# Patient Record
Sex: Female | Born: 1965 | Race: Black or African American | Hispanic: No | Marital: Married | Smoking: Current every day smoker
Health system: Southern US, Community
[De-identification: ages and names within clinical notes are randomized; demographics above are authoritative.]

## PROBLEM LIST (undated history)

## (undated) DIAGNOSIS — N200 Calculus of kidney: Secondary | ICD-10-CM

## (undated) DIAGNOSIS — I10 Essential (primary) hypertension: Secondary | ICD-10-CM

## (undated) DIAGNOSIS — I739 Peripheral vascular disease, unspecified: Secondary | ICD-10-CM

## (undated) DIAGNOSIS — M069 Rheumatoid arthritis, unspecified: Secondary | ICD-10-CM

## (undated) DIAGNOSIS — I119 Hypertensive heart disease without heart failure: Secondary | ICD-10-CM

## (undated) DIAGNOSIS — I2510 Atherosclerotic heart disease of native coronary artery without angina pectoris: Secondary | ICD-10-CM

## (undated) HISTORY — DX: Rheumatoid arthritis, unspecified: M06.9

## (undated) HISTORY — DX: Calculus of kidney: N20.0

---

## 2003-01-02 ENCOUNTER — Emergency Department (HOSPITAL_COMMUNITY): Admission: EM | Admit: 2003-01-02 | Discharge: 2003-01-02 | Payer: Self-pay | Admitting: Emergency Medicine

## 2003-03-08 ENCOUNTER — Ambulatory Visit (HOSPITAL_COMMUNITY): Admission: RE | Admit: 2003-03-08 | Discharge: 2003-03-08 | Payer: Self-pay | Admitting: Orthopaedic Surgery

## 2003-03-08 ENCOUNTER — Encounter: Payer: Self-pay | Admitting: Orthopaedic Surgery

## 2012-02-01 ENCOUNTER — Encounter (HOSPITAL_COMMUNITY): Payer: Self-pay | Admitting: Emergency Medicine

## 2012-02-01 ENCOUNTER — Emergency Department (HOSPITAL_COMMUNITY): Payer: Self-pay

## 2012-02-01 ENCOUNTER — Emergency Department (HOSPITAL_COMMUNITY)
Admission: EM | Admit: 2012-02-01 | Discharge: 2012-02-01 | Disposition: A | Discharge status: Home / Self Care | Payer: Self-pay | Attending: Emergency Medicine | Admitting: Emergency Medicine

## 2012-02-01 DIAGNOSIS — F17200 Nicotine dependence, unspecified, uncomplicated: Secondary | ICD-10-CM | POA: Insufficient documentation

## 2012-02-01 DIAGNOSIS — R51 Headache: Secondary | ICD-10-CM | POA: Insufficient documentation

## 2012-02-01 DIAGNOSIS — R059 Cough, unspecified: Secondary | ICD-10-CM | POA: Insufficient documentation

## 2012-02-01 DIAGNOSIS — R05 Cough: Secondary | ICD-10-CM | POA: Insufficient documentation

## 2012-02-01 DIAGNOSIS — R1110 Vomiting, unspecified: Secondary | ICD-10-CM | POA: Insufficient documentation

## 2012-02-01 DIAGNOSIS — J189 Pneumonia, unspecified organism: Secondary | ICD-10-CM | POA: Insufficient documentation

## 2012-02-01 DIAGNOSIS — R0602 Shortness of breath: Secondary | ICD-10-CM | POA: Insufficient documentation

## 2012-02-01 MED ORDER — ACETAMINOPHEN 500 MG PO TABS
1000.0000 mg | ORAL_TABLET | Freq: Once | ORAL | Status: AC
  Administered 2012-02-01: 1000 mg via ORAL
  Filled 2012-02-01: qty 2

## 2012-02-01 MED ORDER — HYDROCOD POLST-CHLORPHEN POLST 10-8 MG/5ML PO LQCR
5.0000 mL | Freq: Once | ORAL | Status: AC
  Administered 2012-02-01: 5 mL via ORAL
  Filled 2012-02-01: qty 5

## 2012-02-01 MED ORDER — CIPROFLOXACIN HCL 500 MG PO TABS: 500.0000 mg | ORAL_TABLET | Freq: Two times a day (BID) | ORAL | Status: AC

## 2012-02-01 MED ORDER — LEVOFLOXACIN 500 MG PO TABS
500.0000 mg | ORAL_TABLET | Freq: Once | ORAL | Status: AC
  Administered 2012-02-01: 500 mg via ORAL
  Filled 2012-02-01: qty 1

## 2012-02-01 MED ORDER — ALBUTEROL SULFATE HFA 108 (90 BASE) MCG/ACT IN AERS
2.0000 | INHALATION_SPRAY | RESPIRATORY_TRACT | Status: DC
  Administered 2012-02-01 (×2): 2 via RESPIRATORY_TRACT
  Filled 2012-02-01: qty 6.7

## 2012-02-01 MED ORDER — OXYMETAZOLINE HCL 0.05 % NA SOLN
1.0000 | Freq: Three times a day (TID) | NASAL | Status: DC
  Administered 2012-02-01: 1 via NASAL
  Filled 2012-02-01: qty 15

## 2012-02-01 MED ORDER — PROMETHAZINE-CODEINE 6.25-10 MG/5ML PO SYRP: 5.0000 mL | ORAL_SOLUTION | ORAL | Status: AC | PRN

## 2012-02-01 MED ORDER — ONDANSETRON 4 MG PO TBDP
4.0000 mg | ORAL_TABLET | Freq: Once | ORAL | Status: AC
  Administered 2012-02-01: 4 mg via ORAL
  Filled 2012-02-01: qty 1

## 2012-02-01 NOTE — ED Provider Notes (Signed)
History     CSN: [id number]  Arrival date & time 02/01/12  1042   First MD Initiated Contact with Patient 02/01/12 1048      Chief Complaint  Patient presents with  . Headache  . Emesis  . Shortness of Breath  . Cough    (Consider location/radiation/quality/duration/timing/severity/associated sxs/prior treatment) HPI Comments: Patient is a 46 year old African American female who presents to the emergency department with 3 days of headache, cough, nasal congestion, and shortness of breath. The patient denies any high fever. Patient states she's been having a lot of nasal congestion that does not respond to over-the-counter medications. She has had nausea and some vomiting. She's also had a few episodes of diarrhea. On last evening she noted some increase in shortness of breath that she attributes mostly to not be needed to breathe through her nose. The patient denies any chest pain, jaw pain, or arm pain. There's been no noted blood in the vomitus or the diarrhea. Patient presents to the emergency department for assistance with these problems.   Patient states she has been told she has hypertension. She is without a primary care physician at this time waiting for her insurance to start. The patient states she has not taken her blood pressure medicine for several months.  Patient is a 46 y.o. female presenting with headaches, vomiting, shortness of breath, and cough. The history is provided by the patient.  Headache  Associated symptoms include shortness of breath and vomiting. Pertinent negatives include no palpitations.  Emesis  Associated symptoms include cough, diarrhea and headaches. Pertinent negatives include no abdominal pain and no arthralgias.  Shortness of Breath  Associated symptoms include cough and shortness of breath. Pertinent negatives include no chest pain and no wheezing.  Cough Associated symptoms include headaches and shortness of breath. Pertinent negatives include no  chest pain and no wheezing.    History reviewed. No pertinent past medical history.  Past Surgical History  Procedure Date  . Cesarean section     History reviewed. No pertinent family history.  History  Substance Use Topics  . Smoking status: Current Everyday Smoker    Types: Cigarettes  . Smokeless tobacco: Not on file  . Alcohol Use: No    OB History    Grav Para Term Preterm Abortions TAB SAB Ect Mult Living                  Review of Systems  Constitutional: Negative for activity change.       All ROS Neg except as noted in HPI  HENT: Positive for congestion. Negative for nosebleeds and neck pain.   Eyes: Negative for photophobia and discharge.  Respiratory: Positive for cough and shortness of breath. Negative for wheezing.   Cardiovascular: Negative for chest pain and palpitations.  Gastrointestinal: Positive for vomiting and diarrhea. Negative for abdominal pain and blood in stool.  Genitourinary: Negative for dysuria, frequency and hematuria.  Musculoskeletal: Negative for back pain and arthralgias.  Skin: Negative.   Neurological: Positive for headaches. Negative for dizziness, seizures and speech difficulty.  Psychiatric/Behavioral: Negative for hallucinations and confusion.    Allergies  Bee venom  Home Medications  No current outpatient prescriptions on file.  BP 173/100  Pulse 101  Temp 98.5 F (36.9 C) (Oral)  Resp 24  Ht 5\' 4"  (1.626 m)  Wt 165 lb (74.844 kg)  BMI 28.32 kg/m2  SpO2 100%  LMP 01/28/2012  Physical Exam  Nursing note and vitals reviewed. Constitutional: She is  oriented to person, place, and time. She appears well-developed and well-nourished.  Non-toxic appearance.  HENT:  Head: Normocephalic.  Right Ear: Tympanic membrane and external ear normal.  Left Ear: Tympanic membrane and external ear normal.       Nasal congestion.  Eyes: EOM and lids are normal. Pupils are equal, round, and reactive to light.  Neck: Normal range  of motion. Neck supple. Carotid bruit is not present.  Cardiovascular: Normal rate, regular rhythm, normal heart sounds, intact distal pulses and normal pulses.   Pulmonary/Chest: Tachypnea noted. No respiratory distress. She has rhonchi.       Course breath sounds with rhonchi present.  Abdominal: Soft. Bowel sounds are normal. There is no tenderness. There is no guarding.  Musculoskeletal: Normal range of motion.  Lymphadenopathy:       Head (right side): No submandibular adenopathy present.       Head (left side): No submandibular adenopathy present.    She has no cervical adenopathy.  Neurological: She is alert and oriented to person, place, and time. She has normal strength. No cranial nerve deficit or sensory deficit. She exhibits normal muscle tone. Coordination normal.  Skin: Skin is warm and dry.  Psychiatric: She has a normal mood and affect. Her speech is normal.    ED Course  Procedures (including critical care time)   Labs Reviewed  URINALYSIS, ROUTINE W REFLEX MICROSCOPIC   No results found. Pulse Ox 100% on room air.WNL by my interpretation.  No diagnosis found.    MDM  I have reviewed nursing notes, vital signs, and all appropriate lab and imaging results for this patient. Chest xray suggest right infrahilar pneumonia. Pt allergic to multiple families of antibiotics. Rx for cipro and promethazine cough medication given. Pt to see her PCP or return to  ED in 3 days for recheck.       Kathie Dike, PA 02/06/12 1543  Kathie Dike, PA 02/06/12 1544

## 2012-02-01 NOTE — ED Notes (Signed)
Pt was made aware that an in and out cath needed to be done in order to insure a clean sample due to pt being on he menstrual cycle. Pt refused catheter. MD made aware.

## 2012-02-01 NOTE — ED Notes (Signed)
Pt c/o headache, vomiting, cough, and sob since Friday.

## 2012-02-08 NOTE — ED Provider Notes (Signed)
Medical screening examination/treatment/procedure(s) were performed by non-physician practitioner and as supervising physician I was immediately available for consultation/collaboration.   Glynn Octave, MD 02/08/12 1039

## 2014-01-30 ENCOUNTER — Emergency Department (HOSPITAL_COMMUNITY): Payer: BC Managed Care – PPO

## 2014-01-30 ENCOUNTER — Encounter (HOSPITAL_COMMUNITY): Payer: Self-pay | Admitting: Emergency Medicine

## 2014-01-30 ENCOUNTER — Emergency Department (HOSPITAL_COMMUNITY)
Admission: EM | Admit: 2014-01-30 | Discharge: 2014-01-31 | Disposition: A | Discharge status: Home / Self Care | Payer: BC Managed Care – PPO | Attending: Emergency Medicine | Admitting: Emergency Medicine

## 2014-01-30 DIAGNOSIS — N949 Unspecified condition associated with female genital organs and menstrual cycle: Secondary | ICD-10-CM | POA: Insufficient documentation

## 2014-01-30 DIAGNOSIS — N938 Other specified abnormal uterine and vaginal bleeding: Secondary | ICD-10-CM | POA: Insufficient documentation

## 2014-01-30 DIAGNOSIS — F17200 Nicotine dependence, unspecified, uncomplicated: Secondary | ICD-10-CM | POA: Insufficient documentation

## 2014-01-30 DIAGNOSIS — R110 Nausea: Secondary | ICD-10-CM | POA: Insufficient documentation

## 2014-01-30 DIAGNOSIS — Z79899 Other long term (current) drug therapy: Secondary | ICD-10-CM | POA: Insufficient documentation

## 2014-01-30 DIAGNOSIS — R1030 Lower abdominal pain, unspecified: Secondary | ICD-10-CM

## 2014-01-30 DIAGNOSIS — Z3202 Encounter for pregnancy test, result negative: Secondary | ICD-10-CM | POA: Insufficient documentation

## 2014-01-30 DIAGNOSIS — R109 Unspecified abdominal pain: Secondary | ICD-10-CM | POA: Insufficient documentation

## 2014-01-30 LAB — URINALYSIS, ROUTINE W REFLEX MICROSCOPIC
Bilirubin Urine: NEGATIVE
GLUCOSE, UA: NEGATIVE mg/dL
KETONES UR: NEGATIVE mg/dL
LEUKOCYTES UA: NEGATIVE
NITRITE: NEGATIVE
PH: 8 (ref 5.0–8.0)
Protein, ur: NEGATIVE mg/dL
Specific Gravity, Urine: 1.015 (ref 1.005–1.030)
Urobilinogen, UA: 0.2 mg/dL (ref 0.0–1.0)

## 2014-01-30 LAB — COMPREHENSIVE METABOLIC PANEL
ALBUMIN: 4 g/dL (ref 3.5–5.2)
ALT: 16 U/L (ref 0–35)
ANION GAP: 17 — AB (ref 5–15)
AST: 18 U/L (ref 0–37)
Alkaline Phosphatase: 92 U/L (ref 39–117)
BUN: 9 mg/dL (ref 6–23)
CALCIUM: 9.9 mg/dL (ref 8.4–10.5)
CHLORIDE: 100 meq/L (ref 96–112)
CO2: 21 mEq/L (ref 19–32)
CREATININE: 0.57 mg/dL (ref 0.50–1.10)
GFR calc Af Amer: >90 mL/min (ref >90)
GFR calc non Af Amer: >90 mL/min (ref >90)
Glucose, Bld: 145 mg/dL — ABNORMAL HIGH (ref 70–99)
Potassium: 3.3 mEq/L — ABNORMAL LOW (ref 3.7–5.3)
Sodium: 138 mEq/L (ref 137–147)
TOTAL PROTEIN: 7.8 g/dL (ref 6.0–8.3)
Total Bilirubin: <0.2 mg/dL — ABNORMAL LOW (ref 0.3–1.2)

## 2014-01-30 LAB — CBC WITH DIFFERENTIAL/PLATELET
BASOS ABS: 0 10*3/uL (ref 0.0–0.1)
BASOS PCT: 1 % (ref 0–1)
EOS ABS: 0.2 10*3/uL (ref 0.0–0.7)
EOS PCT: 3 % (ref 0–5)
HEMATOCRIT: 39.1 % (ref 36.0–46.0)
Hemoglobin: 13.4 g/dL (ref 12.0–15.0)
Lymphocytes Relative: 65 % — ABNORMAL HIGH (ref 12–46)
Lymphs Abs: 3.3 10*3/uL (ref 0.7–4.0)
MCH: 30 pg (ref 26.0–34.0)
MCHC: 34.3 g/dL (ref 30.0–36.0)
MCV: 87.5 fL (ref 78.0–100.0)
MONO ABS: 0.5 10*3/uL (ref 0.1–1.0)
MONOS PCT: 10 % (ref 3–12)
NEUTROS ABS: 1 10*3/uL — AB (ref 1.7–7.7)
Neutrophils Relative %: 21 % — ABNORMAL LOW (ref 43–77)
Platelets: 341 10*3/uL (ref 150–400)
RBC: 4.47 MIL/uL (ref 3.87–5.11)
RDW: 14.7 % (ref 11.5–15.5)
WBC: 5 10*3/uL (ref 4.0–10.5)

## 2014-01-30 LAB — URINE MICROSCOPIC-ADD ON

## 2014-01-30 LAB — PREGNANCY, URINE: PREG TEST UR: NEGATIVE

## 2014-01-30 LAB — RPR

## 2014-01-30 MED ORDER — ONDANSETRON HCL 4 MG/2ML IJ SOLN
4.0000 mg | Freq: Once | INTRAMUSCULAR | Status: AC
  Administered 2014-01-30: 4 mg via INTRAVENOUS
  Filled 2014-01-30: qty 2

## 2014-01-30 MED ORDER — LABETALOL HCL 5 MG/ML IV SOLN
20.0000 mg | Freq: Once | INTRAVENOUS | Status: AC
  Administered 2014-01-31: 20 mg via INTRAVENOUS
  Filled 2014-01-30: qty 4

## 2014-01-30 MED ORDER — HYDROMORPHONE HCL PF 1 MG/ML IJ SOLN
1.0000 mg | Freq: Once | INTRAMUSCULAR | Status: AC
  Administered 2014-01-30: 1 mg via INTRAVENOUS
  Filled 2014-01-30: qty 1

## 2014-01-30 MED ORDER — FENTANYL CITRATE 0.05 MG/ML IJ SOLN
50.0000 ug | Freq: Once | INTRAMUSCULAR | Status: AC
  Administered 2014-01-30: 50 ug via INTRAVENOUS
  Filled 2014-01-30: qty 2

## 2014-01-30 MED ORDER — HYDROCODONE-ACETAMINOPHEN 5-325 MG PO TABS: 1.0000 | ORAL_TABLET | ORAL | Status: DC | PRN

## 2014-01-30 MED ORDER — SODIUM CHLORIDE 0.9 % IV BOLUS (SEPSIS)
1000.0000 mL | Freq: Once | INTRAVENOUS | Status: AC
  Administered 2014-01-30: 1000 mL via INTRAVENOUS

## 2014-01-30 NOTE — Discharge Instructions (Signed)

## 2014-01-30 NOTE — ED Provider Notes (Signed)
CSN: [id number]     Arrival date & time 01/30/14  1519 History   None    Chief Complaint  Patient presents with  . Abdominal Pain     (Consider location/radiation/quality/duration/timing/severity/associated sxs/prior Treatment) HPI 48 y/o female with PMH fibroids that presents with acute onset suprapubic pain that radiates to the right flank. Patient denies similar pain the past. Noted to be severe, sharp, and constant. Patient currently having increased menstrual flow and is on 5th day of cycle. Nausea with out vomiting, no fever, no urinary symptoms.    History reviewed. No pertinent past medical history. Past Surgical History  Procedure Laterality Date  . Cesarean section     History reviewed. No pertinent family history. History  Substance Use Topics  . Smoking status: Current Every Day Smoker    Types: Cigarettes  . Smokeless tobacco: Not on file  . Alcohol Use: No   OB History   Grav Para Term Preterm Abortions TAB SAB Ect Mult Living                 Review of Systems  Constitutional: Negative for activity change.  HENT: Negative for congestion.   Respiratory: Negative for cough and shortness of breath.   Cardiovascular: Negative for chest pain and leg swelling.  Gastrointestinal: Positive for nausea and abdominal pain. Negative for vomiting, diarrhea, constipation, blood in stool and abdominal distention.  Genitourinary: Positive for menstrual problem. Negative for dysuria, flank pain and vaginal discharge.  Musculoskeletal: Negative for back pain.  Skin: Negative for color change.  Neurological: Negative for syncope and headaches.  Psychiatric/Behavioral: Negative for agitation.      Allergies  Bee venom  Home Medications   Prior to Admission medications   Medication Sig Start Date End Date Taking? Authorizing Provider  ibuprofen (ADVIL,MOTRIN) 200 MG tablet Take 600 mg by mouth every 6 (six) hours as needed. Pain    Historical Provider, MD  IRON PO Take 1  tablet by mouth daily.    Historical Provider, MD  Multiple Vitamins-Minerals (ONE-A-DAY ENERGY PO) Take 1 tablet by mouth daily.    Historical Provider, MD  Phenyleph-CPM-DM-Aspirin (ALKA-SELTZER PLUS COLD & COUGH) 7.02-18-09-325 MG TBEF Take 2 tablets by mouth daily as needed. Cold Symptoms    Historical Provider, MD   BP 207/104  Pulse 81  Temp(Src) 98.5 F (36.9 C) (Oral)  Resp 23  Ht 5\' 4"  (1.626 m)  Wt 165 lb (74.844 kg)  BMI 28.31 kg/m2  SpO2 100% Physical Exam  Constitutional: She is oriented to person, place, and time. She appears well-developed.  HENT:  Head: Normocephalic.  Eyes: Pupils are equal, round, and reactive to light.  Neck: Neck supple.  Cardiovascular: Normal rate.  Exam reveals no gallop and no friction rub.   No murmur heard. Pulmonary/Chest: Effort normal and breath sounds normal. No respiratory distress.  Abdominal: Soft. She exhibits no distension. There is tenderness in the suprapubic area. There is CVA tenderness. There is no rigidity, no rebound, no guarding, no tenderness at McBurney's point and negative Murphy's sign.    Genitourinary: No tenderness around the vagina. No signs of injury around the vagina. No vaginal discharge found.  Pelvic exam-  Diffuse blood in vault, unable to visualize anatomy.   Musculoskeletal: She exhibits no edema.  Neurological: She is alert and oriented to person, place, and time.  Skin: Skin is warm.  Psychiatric: She has a normal mood and affect.    ED Course  Procedures (including critical care time) Labs  Review Labs Reviewed  CBC WITH DIFFERENTIAL  COMPREHENSIVE METABOLIC PANEL  URINALYSIS, ROUTINE W REFLEX MICROSCOPIC  PREGNANCY, URINE    Imaging Review US Transvaginal Non-ob  01/30/2014   CLINICAL DATA:  Pelvic pain  EXAM: TRANSABDOMINAL AND TRANSVAGINAL ULTRASOUND OF PELVIS  TECHNIQUE: Both transabdominal and transvaginal ultrasound examinations of the pelvis were performed. Transabdominal technique was  performed for global imaging of the pelvis including uterus, ovaries, adnexal regions, and pelvic cul-de-sac. It was necessary to proceed with endovaginal exam following the transabdominal exam to visualize the ovaries.  COMPARISON:  CT from earlier in the same day  FINDINGS: Uterus  Measurements: 12.6 x 6.3 x 7.7 cm. No fibroids or other mass visualized.  Endometrium  Thickness: 18 mm in thickness. Complex nature to the endometrium is noted. By history patient is currently on her menstrual cycle in these changes may be related to and menstrual cycle.  Right ovary  Not well visualized  Left ovary  Measurements: 2.7 x 2.4 x 2.4 cm. Prominent 2.7 cm cyst is noted within the left ovary similar to that seen on prior CT.  Other findings  No free fluid.  IMPRESSION: Heterogeneous endometrium which is prominent. This is likely related the patient's current mental status. Clinical correlation is recommended.  Nonvisualization the right ovary.  Left ovarian cysts.   Electronically Signed   By: Alcide Clever M.D.   On: 01/30/2014 21:41   US Pelvis Complete  01/30/2014   CLINICAL DATA:  Pelvic pain  EXAM: TRANSABDOMINAL AND TRANSVAGINAL ULTRASOUND OF PELVIS  TECHNIQUE: Both transabdominal and transvaginal ultrasound examinations of the pelvis were performed. Transabdominal technique was performed for global imaging of the pelvis including uterus, ovaries, adnexal regions, and pelvic cul-de-sac. It was necessary to proceed with endovaginal exam following the transabdominal exam to visualize the ovaries.  COMPARISON:  CT from earlier in the same day  FINDINGS: Uterus  Measurements: 12.6 x 6.3 x 7.7 cm. No fibroids or other mass visualized.  Endometrium  Thickness: 18 mm in thickness. Complex nature to the endometrium is noted. By history patient is currently on her menstrual cycle in these changes may be related to and menstrual cycle.  Right ovary  Not well visualized  Left ovary  Measurements: 2.7 x 2.4 x 2.4 cm. Prominent  2.7 cm cyst is noted within the left ovary similar to that seen on prior CT.  Other findings  No free fluid.  IMPRESSION: Heterogeneous endometrium which is prominent. This is likely related the patient's current mental status. Clinical correlation is recommended.  Nonvisualization the right ovary.  Left ovarian cysts.   Electronically Signed   By: Alcide Clever M.D.   On: 01/30/2014 21:41   [place] Art/ven Flow Abd Pelv Doppler  01/31/2014   CLINICAL DATA:  Pelvic pain  EXAM: DOPPLER EVALUATION OF THE OVARIES  COMPARISON:  Transabdominal and transvaginal pelvic ultrasound January 30, 2014  FINDINGS: Both ovaries have low resistance waveforms. The peak systolic velocity in the left ovary is 6.9 cm/sec. The peak systolic velocity in the right ovary is 3.8 cm/sec. There is venous outflow demonstrated bilaterally.  IMPRESSION: No demonstrable ovarian torsion. Expected low resistance waveforms bilaterally. Venous outflow demonstrated bilaterally.   Electronically Signed   By: Bretta Bang M.D.   On: 01/31/2014 01:02     EKG Interpretation None      MDM   Final diagnoses:  Suprapubic abdominal pain, unspecified laterality   49 y/o female presents with severe acute onset of right CVA and suprapubic region  with out fever. Patient has no evidence of infection on UA and CMP noted to have good kidney function. Patient evaluated with CT abd/pelvis stone study and no evidence of nephrolithiasis noted.  Patient continued to have severe pain and further evaluated with transvaginal US with doppler, noted to not demonstrate torsion.  [place] noted to demonstrate prominent endometrium.   Patient given PO pain medication and instructed to have close follow-up with OB/GYN. Patient also noted to have HTN in the department and states to have BPs at home of 140's systolic. Started patient on HCTZ and instructed to follow-up with primary care provider for further evaluation.      Clement Sayres, MD 02/01/14 1840

## 2014-01-30 NOTE — ED Notes (Addendum)
Pt c/o R sided abd pain into lower back with n/v since yesterday. She has missed her period for 3 months "but i just thought i was going through the change."  She is tearful now

## 2014-01-30 NOTE — ED Notes (Signed)
Patient declining further nausea medication, states "I just want Ice chips and some saltines". Dr. Frazier Richards made aware and states OK to give to patient. Pt given ice chips and saltines. Pt in NAD.

## 2014-01-30 NOTE — ED Notes (Signed)
Patient states she has high BP and was on medication for it but has not had a PCP to refill her medication so her BP has been extremely high.

## 2014-01-31 ENCOUNTER — Emergency Department (HOSPITAL_COMMUNITY): Payer: BC Managed Care – PPO

## 2014-01-31 LAB — HIV ANTIBODY (ROUTINE TESTING W REFLEX): HIV 1&2 Ab, 4th Generation: NONREACTIVE

## 2014-01-31 MED ORDER — HYDROCHLOROTHIAZIDE 25 MG PO TABS
25.0000 mg | ORAL_TABLET | Freq: Every day | ORAL | Status: DC
  Administered 2014-01-31: 25 mg via ORAL
  Filled 2014-01-31: qty 1

## 2014-01-31 MED ORDER — HYDROCODONE-ACETAMINOPHEN 5-325 MG PO TABS: 1.0000 | ORAL_TABLET | ORAL | Status: DC | PRN

## 2014-01-31 MED ORDER — HYDROCHLOROTHIAZIDE 12.5 MG PO CAPS: 12.5000 mg | ORAL_CAPSULE | Freq: Every day | ORAL | Status: DC

## 2014-01-31 MED ORDER — HYDROCHLOROTHIAZIDE 12.5 MG PO TABS: 25.0000 mg | ORAL_TABLET | Freq: Every day | ORAL | Status: DC

## 2014-01-31 MED ORDER — HYDRALAZINE HCL 20 MG/ML IJ SOLN: 5.0000 mg | Freq: Once | INTRAMUSCULAR | Status: DC

## 2014-01-31 NOTE — ED Notes (Signed)
Patient given BP medication. Waiting to DC in .

## 2014-01-31 NOTE — ED Notes (Signed)
Spoke with EDP about patients increased BP, blurred vision and headache. Orders received.

## 2014-01-31 NOTE — ED Notes (Signed)
Patient given meal bag. 

## 2014-02-01 ENCOUNTER — Telehealth (HOSPITAL_BASED_OUTPATIENT_CLINIC_OR_DEPARTMENT_OTHER): Payer: Self-pay | Admitting: Emergency Medicine

## 2014-02-01 NOTE — Telephone Encounter (Signed)
Pharmacy called requesting change for Hydrodiuril. Tablets too expensive and requested change to capsules. Okayed change.

## 2014-02-04 ENCOUNTER — Inpatient Hospital Stay (HOSPITAL_COMMUNITY): Payer: BC Managed Care – PPO

## 2014-02-04 ENCOUNTER — Inpatient Hospital Stay (HOSPITAL_COMMUNITY)
Admission: EM | Admit: 2014-02-04 | Discharge: 2014-02-06 | DRG: 757 | Disposition: A | Discharge status: Home / Self Care | Payer: BC Managed Care – PPO | Attending: Obstetrics & Gynecology | Admitting: Obstetrics & Gynecology

## 2014-02-04 ENCOUNTER — Encounter (HOSPITAL_COMMUNITY): Payer: Self-pay | Admitting: Emergency Medicine

## 2014-02-04 DIAGNOSIS — A419 Sepsis, unspecified organism: Secondary | ICD-10-CM | POA: Diagnosis present

## 2014-02-04 DIAGNOSIS — N12 Tubulo-interstitial nephritis, not specified as acute or chronic: Secondary | ICD-10-CM

## 2014-02-04 DIAGNOSIS — E875 Hyperkalemia: Secondary | ICD-10-CM | POA: Diagnosis present

## 2014-02-04 DIAGNOSIS — N730 Acute parametritis and pelvic cellulitis: Principal | ICD-10-CM | POA: Diagnosis present

## 2014-02-04 DIAGNOSIS — N7093 Salpingitis and oophoritis, unspecified: Secondary | ICD-10-CM | POA: Diagnosis present

## 2014-02-04 DIAGNOSIS — I10 Essential (primary) hypertension: Secondary | ICD-10-CM | POA: Diagnosis present

## 2014-02-04 DIAGNOSIS — F17200 Nicotine dependence, unspecified, uncomplicated: Secondary | ICD-10-CM | POA: Diagnosis present

## 2014-02-04 DIAGNOSIS — R1032 Left lower quadrant pain: Secondary | ICD-10-CM | POA: Diagnosis present

## 2014-02-04 DIAGNOSIS — E876 Hypokalemia: Secondary | ICD-10-CM | POA: Diagnosis present

## 2014-02-04 DIAGNOSIS — K439 Ventral hernia without obstruction or gangrene: Secondary | ICD-10-CM | POA: Diagnosis present

## 2014-02-04 DIAGNOSIS — R112 Nausea with vomiting, unspecified: Secondary | ICD-10-CM | POA: Diagnosis present

## 2014-02-04 DIAGNOSIS — N83209 Unspecified ovarian cyst, unspecified side: Secondary | ICD-10-CM | POA: Diagnosis present

## 2014-02-04 HISTORY — DX: Essential (primary) hypertension: I10

## 2014-02-04 LAB — CBC WITH DIFFERENTIAL/PLATELET
BASOS ABS: 0 10*3/uL (ref 0.0–0.1)
BASOS PCT: 0 % (ref 0–1)
Eosinophils Absolute: 0.1 10*3/uL (ref 0.0–0.7)
Eosinophils Relative: 1 % (ref 0–5)
HCT: 35 % — ABNORMAL LOW (ref 36.0–46.0)
Hemoglobin: 12.1 g/dL (ref 12.0–15.0)
Lymphocytes Relative: 18 % (ref 12–46)
Lymphs Abs: 1.4 10*3/uL (ref 0.7–4.0)
MCH: 30.3 pg (ref 26.0–34.0)
MCHC: 34.6 g/dL (ref 30.0–36.0)
MCV: 87.7 fL (ref 78.0–100.0)
Monocytes Absolute: 1.1 10*3/uL — ABNORMAL HIGH (ref 0.1–1.0)
Monocytes Relative: 15 % — ABNORMAL HIGH (ref 3–12)
NEUTROS PCT: 66 % (ref 43–77)
Neutro Abs: 4.9 10*3/uL (ref 1.7–7.7)
PLATELETS: 384 10*3/uL (ref 150–400)
RBC: 3.99 MIL/uL (ref 3.87–5.11)
RDW: 14.7 % (ref 11.5–15.5)
WBC: 7.4 10*3/uL (ref 4.0–10.5)

## 2014-02-04 LAB — LIPASE, BLOOD: Lipase: 26 U/L (ref 11–59)

## 2014-02-04 LAB — URINALYSIS, ROUTINE W REFLEX MICROSCOPIC
Bilirubin Urine: NEGATIVE
Glucose, UA: NEGATIVE mg/dL
KETONES UR: 15 mg/dL — AB
NITRITE: NEGATIVE
PH: 5.5 (ref 5.0–8.0)
PROTEIN: NEGATIVE mg/dL
Specific Gravity, Urine: 1.022 (ref 1.005–1.030)
Urobilinogen, UA: 2 mg/dL — ABNORMAL HIGH (ref 0.0–1.0)

## 2014-02-04 LAB — URINE MICROSCOPIC-ADD ON

## 2014-02-04 LAB — I-STAT CG4 LACTIC ACID, ED: LACTIC ACID, VENOUS: 1.41 mmol/L (ref 0.5–2.2)

## 2014-02-04 LAB — COMPREHENSIVE METABOLIC PANEL
ALBUMIN: 3.4 g/dL — AB (ref 3.5–5.2)
ALT: 21 U/L (ref 0–35)
ANION GAP: 20 — AB (ref 5–15)
AST: 25 U/L (ref 0–37)
Alkaline Phosphatase: 99 U/L (ref 39–117)
BILIRUBIN TOTAL: 0.5 mg/dL (ref 0.3–1.2)
BUN: 16 mg/dL (ref 6–23)
CALCIUM: 9.8 mg/dL (ref 8.4–10.5)
CHLORIDE: 92 meq/L — AB (ref 96–112)
CO2: 26 mEq/L (ref 19–32)
Creatinine, Ser: 0.8 mg/dL (ref 0.50–1.10)
GFR calc Af Amer: >90 mL/min (ref >90)
GFR, EST NON AFRICAN AMERICAN: 86 mL/min — AB (ref >90)
Glucose, Bld: 99 mg/dL (ref 70–99)
Potassium: 2.9 mEq/L — CL (ref 3.7–5.3)
Sodium: 138 mEq/L (ref 137–147)
Total Protein: 7.9 g/dL (ref 6.0–8.3)

## 2014-02-04 LAB — MAGNESIUM: Magnesium: 1.7 mg/dL (ref 1.5–2.5)

## 2014-02-04 LAB — WET PREP, GENITAL
Clue Cells Wet Prep HPF POC: NONE SEEN
Trich, Wet Prep: NONE SEEN
YEAST WET PREP: NONE SEEN

## 2014-02-04 LAB — POTASSIUM: Potassium: 3 mEq/L — ABNORMAL LOW (ref 3.7–5.3)

## 2014-02-04 LAB — PREGNANCY, URINE: PREG TEST UR: NEGATIVE

## 2014-02-04 MED ORDER — ONDANSETRON HCL 4 MG/2ML IJ SOLN: 4.0000 mg | Freq: Four times a day (QID) | INTRAMUSCULAR | Status: DC | PRN

## 2014-02-04 MED ORDER — IOHEXOL 300 MG/ML  SOLN
100.0000 mL | Freq: Once | INTRAMUSCULAR | Status: AC | PRN
  Administered 2014-02-04: 100 mL via INTRAVENOUS

## 2014-02-04 MED ORDER — IOHEXOL 300 MG/ML  SOLN
50.0000 mL | Freq: Once | INTRAMUSCULAR | Status: AC | PRN
  Administered 2014-02-04: 50 mL via ORAL

## 2014-02-04 MED ORDER — HYDROMORPHONE HCL PF 1 MG/ML IJ SOLN
0.2000 mg | INTRAMUSCULAR | Status: DC | PRN
Start: 2014-02-04 — End: 2014-02-06
  Administered 2014-02-05 (×2): 0.6 mg via INTRAVENOUS
  Filled 2014-02-04 (×2): qty 1

## 2014-02-04 MED ORDER — METRONIDAZOLE IN NACL 5-0.79 MG/ML-% IV SOLN
500.0000 mg | Freq: Three times a day (TID) | INTRAVENOUS | Status: DC
  Administered 2014-02-04: 500 mg via INTRAVENOUS
  Filled 2014-02-04: qty 100

## 2014-02-04 MED ORDER — SODIUM CHLORIDE 0.9 % IV SOLN: INTRAVENOUS | Status: DC

## 2014-02-04 MED ORDER — ONDANSETRON HCL 4 MG/2ML IJ SOLN
4.0000 mg | Freq: Once | INTRAMUSCULAR | Status: AC
  Administered 2014-02-04: 4 mg via INTRAVENOUS
  Filled 2014-02-04: qty 2

## 2014-02-04 MED ORDER — ONDANSETRON HCL 4 MG PO TABS: 4.0000 mg | ORAL_TABLET | Freq: Four times a day (QID) | ORAL | Status: DC | PRN

## 2014-02-04 MED ORDER — POTASSIUM CHLORIDE IN NACL 40-0.9 MEQ/L-% IV SOLN
INTRAVENOUS | Status: DC
  Administered 2014-02-04: 125 mL/h via INTRAVENOUS
  Filled 2014-02-04: qty 1000

## 2014-02-04 MED ORDER — POTASSIUM CHLORIDE IN NACL 40-0.9 MEQ/L-% IV SOLN
INTRAVENOUS | Status: DC
  Filled 2014-02-04 (×2): qty 1000

## 2014-02-04 MED ORDER — PRENATAL MULTIVITAMIN CH
1.0000 | ORAL_TABLET | Freq: Every day | ORAL | Status: DC
  Administered 2014-02-05 – 2014-02-06 (×2): 1 via ORAL
  Filled 2014-02-04 (×2): qty 1

## 2014-02-04 MED ORDER — DOXYCYCLINE HYCLATE 100 MG IV SOLR
100.0000 mg | Freq: Two times a day (BID) | INTRAVENOUS | Status: DC
  Administered 2014-02-04: 100 mg via INTRAVENOUS
  Filled 2014-02-04 (×3): qty 100

## 2014-02-04 MED ORDER — IBUPROFEN 600 MG PO TABS
600.0000 mg | ORAL_TABLET | Freq: Four times a day (QID) | ORAL | Status: DC | PRN
  Administered 2014-02-05 – 2014-02-06 (×4): 600 mg via ORAL
  Filled 2014-02-04 (×4): qty 1

## 2014-02-04 MED ORDER — HYDRALAZINE HCL 20 MG/ML IJ SOLN
10.0000 mg | Freq: Four times a day (QID) | INTRAMUSCULAR | Status: DC | PRN
  Administered 2014-02-06 (×2): 10 mg via INTRAVENOUS
  Filled 2014-02-04 (×2): qty 1

## 2014-02-04 MED ORDER — MORPHINE SULFATE 4 MG/ML IJ SOLN
4.0000 mg | Freq: Once | INTRAMUSCULAR | Status: AC
Start: 2014-02-04 — End: 2014-02-04
  Administered 2014-02-04: 4 mg via INTRAVENOUS
  Filled 2014-02-04: qty 1

## 2014-02-04 MED ORDER — POTASSIUM CHLORIDE 10 MEQ/100ML IV SOLN
10.0000 meq | Freq: Once | INTRAVENOUS | Status: AC
  Administered 2014-02-04: 10 meq via INTRAVENOUS
  Filled 2014-02-04: qty 100

## 2014-02-04 MED ORDER — DEXTROSE 5 % IV SOLN: 1.0000 g | INTRAVENOUS | Status: DC

## 2014-02-04 MED ORDER — POTASSIUM CHLORIDE 10 MEQ/100ML IV SOLN
10.0000 meq | INTRAVENOUS | Status: DC
  Filled 2014-02-04 (×4): qty 100

## 2014-02-04 MED ORDER — ONDANSETRON HCL 4 MG/2ML IJ SOLN
4.0000 mg | Freq: Four times a day (QID) | INTRAMUSCULAR | Status: DC | PRN
  Administered 2014-02-06: 4 mg via INTRAVENOUS
  Filled 2014-02-04: qty 2

## 2014-02-04 MED ORDER — HEPARIN SODIUM (PORCINE) 5000 UNIT/ML IJ SOLN
5000.0000 [IU] | Freq: Three times a day (TID) | INTRAMUSCULAR | Status: DC
  Filled 2014-02-04: qty 1

## 2014-02-04 MED ORDER — MORPHINE SULFATE 4 MG/ML IJ SOLN: 2.0000 mg | INTRAMUSCULAR | Status: DC | PRN

## 2014-02-04 MED ORDER — POLYETHYLENE GLYCOL 3350 17 G PO PACK
17.0000 g | PACK | Freq: Every day | ORAL | Status: DC | PRN
  Filled 2014-02-04: qty 1

## 2014-02-04 MED ORDER — ALUM & MAG HYDROXIDE-SIMETH 200-200-20 MG/5ML PO SUSP: 30.0000 mL | Freq: Four times a day (QID) | ORAL | Status: DC | PRN

## 2014-02-04 MED ORDER — HYDROCHLOROTHIAZIDE 25 MG PO TABS: 12.5000 mg | ORAL_TABLET | Freq: Every day | ORAL | Status: DC

## 2014-02-04 MED ORDER — HYDROCODONE-ACETAMINOPHEN 5-325 MG PO TABS
1.0000 | ORAL_TABLET | ORAL | Status: DC | PRN
  Administered 2014-02-05 – 2014-02-06 (×3): 2 via ORAL
  Filled 2014-02-04 (×3): qty 2

## 2014-02-04 MED ORDER — DEXTROSE 5 % IV SOLN
1.0000 g | Freq: Two times a day (BID) | INTRAVENOUS | Status: DC
  Administered 2014-02-05 – 2014-02-06 (×4): 1 g via INTRAVENOUS
  Filled 2014-02-04 (×5): qty 1

## 2014-02-04 MED ORDER — SODIUM CHLORIDE 0.9 % IV BOLUS (SEPSIS)
500.0000 mL | Freq: Once | INTRAVENOUS | Status: AC
  Administered 2014-02-04: 500 mL via INTRAVENOUS

## 2014-02-04 MED ORDER — ACETAMINOPHEN 500 MG PO TABS
1000.0000 mg | ORAL_TABLET | Freq: Once | ORAL | Status: AC
  Administered 2014-02-04: 1000 mg via ORAL
  Filled 2014-02-04: qty 2

## 2014-02-04 MED ORDER — SODIUM CHLORIDE 0.9 % IV BOLUS (SEPSIS)
1000.0000 mL | Freq: Once | INTRAVENOUS | Status: AC
  Administered 2014-02-04: 1000 mL via INTRAVENOUS

## 2014-02-04 MED ORDER — DEXTROSE 5 % IV SOLN
2.0000 g | Freq: Once | INTRAVENOUS | Status: AC
  Administered 2014-02-04: 2 g via INTRAVENOUS
  Filled 2014-02-04: qty 2

## 2014-02-04 MED ORDER — POTASSIUM CHLORIDE CRYS ER 20 MEQ PO TBCR
40.0000 meq | EXTENDED_RELEASE_TABLET | Freq: Once | ORAL | Status: AC
  Administered 2014-02-04: 40 meq via ORAL
  Filled 2014-02-04: qty 2

## 2014-02-04 MED ORDER — GUAIFENESIN-DM 100-10 MG/5ML PO SYRP
5.0000 mL | ORAL_SOLUTION | ORAL | Status: DC | PRN
  Filled 2014-02-04: qty 5

## 2014-02-04 MED ORDER — LACTATED RINGERS IV SOLN
INTRAVENOUS | Status: DC
  Administered 2014-02-05 – 2014-02-06 (×3): via INTRAVENOUS

## 2014-02-04 NOTE — ED Notes (Signed)
Lab called with K+ 2.9.  Advised PA.

## 2014-02-04 NOTE — ED Notes (Signed)
Per Dr. Thedore Mins do not send to room until ultrasound report back and pelvic exam has been completed then have Dr. Vesta Mixer page Dr. Thedore Mins at [phone].

## 2014-02-04 NOTE — ED Notes (Signed)
Called Rhea in lab to add on urine preg.

## 2014-02-04 NOTE — ED Notes (Signed)
Called Darla @ [phone] to advise instructions given by Dr. Thedore Mins.

## 2014-02-04 NOTE — ED Notes (Signed)
Attempted report 

## 2014-02-04 NOTE — ED Notes (Signed)
Patient transported to CT 

## 2014-02-04 NOTE — ED Provider Notes (Signed)
CSN: [id number]     Arrival date & time 02/04/14  1100 History   First MD Initiated Contact with Patient 02/04/14 1139     Chief Complaint  Patient presents with  . Emesis  . Fatigue     (Consider location/radiation/quality/duration/timing/severity/associated sxs/prior Treatment) HPI Comments: Kathleen [name] is a 48 y.o. Female with a PMHx of HTN diagnosed 5 days ago, and a PSHx of c-sections presenting today with ongoing worsening abd pain, N/V, and fevers. Pt states she was seen for abd pain 5 days ago, which at that time was more right sided, had a big work up that revealed only a ovarian cyst, therefore she was told to follow up with gynecology and given Percocet. She states that since then, the pain has localized to her LLQ/suprapubic area, wraps around the L side of her abd and radiates to her L flank, is constant, stabbing, 10/10, and worsening, worsened with urination and unrelieved by Percocet, with no known alleviating factors. She states that she now has developed darker urine, painful urination, increased frequency and urgency. She states that she's still having her menses but that her vaginal bleeding has slowed down, and denies any vaginal discharge or odors. Denies any recent sexual activity, and was given a urine pregnancy test 5 days ago which was negative, and she has not had intercourse since then. Endorses N/V. Denies diarrhea or constipation, continues to pass gas, denies hematochezia or melena. Endorses some muscle cramping and tingling which have improved with the HCTZ she was started on 5 days ago, but are still ongoing now. Denies HA, vision changes, CP, SOB, cough, hemoptysis, arthralgias, LE edema or weakness, diaphoresis, or cauda equina symptoms. Denies rashes. Denies changes in diet or recent travel.  Patient is a 48 y.o. female presenting with abdominal pain. The history is provided by the patient. No language interpreter was used.  Abdominal Pain Pain location:   LLQ Pain quality: stabbing   Pain radiates to:  L flank Pain severity:  Severe Onset quality:  Gradual Duration:  5 days Timing:  Constant Progression:  Worsening Chronicity:  New Context: not alcohol use, not recent sexual activity and not sick contacts   Relieved by:  Nothing Worsened by:  Urination and palpation Ineffective treatments: PO narcotics. Associated symptoms: chills, dysuria, fatigue, fever, hematuria, nausea, vaginal bleeding (on menses, lessened from 5 days ago) and vomiting   Associated symptoms: no belching, no chest pain, no constipation, no cough, no diarrhea, no flatus, no hematemesis, no hematochezia, no melena, no shortness of breath and no vaginal discharge     History reviewed. No pertinent past medical history. Past Surgical History  Procedure Laterality Date  . Cesarean section     History reviewed. No pertinent family history. History  Substance Use Topics  . Smoking status: Current Every Day Smoker -- 0.50 packs/day    Types: Cigarettes  . Smokeless tobacco: Not on file  . Alcohol Use: No   OB History   Grav Para Term Preterm Abortions TAB SAB Ect Mult Living                 Review of Systems  Constitutional: Positive for fever, chills and fatigue.  Eyes: Negative for photophobia and visual disturbance.  Respiratory: Negative for cough, chest tightness and shortness of breath.   Cardiovascular: Negative for chest pain and leg swelling.  Gastrointestinal: Positive for nausea, vomiting and abdominal pain. Negative for diarrhea, constipation, blood in stool, melena, hematochezia, abdominal distention, anal bleeding, rectal pain,  flatus and hematemesis.  Genitourinary: Positive for dysuria, urgency, frequency, hematuria, flank pain and vaginal bleeding (on menses, lessened from 5 days ago). Negative for decreased urine volume, vaginal discharge, difficulty urinating, genital sores and vaginal pain.  Musculoskeletal: Positive for myalgias. Negative  for arthralgias, neck pain and neck stiffness.  Skin: Negative for rash.  Neurological: Positive for weakness. Negative for dizziness, light-headedness, numbness and headaches.       +tingling/paresthesias  Psychiatric/Behavioral: Negative for confusion.  10 Systems reviewed and are negative for acute change except as noted in the HPI.     Allergies  Bee venom  Home Medications   Prior to Admission medications   Medication Sig Start Date End Date Taking? Authorizing Provider  hydrochlorothiazide (HYDRODIURIL) 12.5 MG tablet Take 2 tablets (25 mg total) by mouth daily. 01/31/14  Yes Clement Sayres, MD  HYDROcodone-acetaminophen (NORCO/VICODIN) 5-325 MG per tablet Take 1 tablet by mouth every 4 (four) hours as needed for moderate pain or severe pain. 01/30/14  Yes Clement Sayres, MD  ibuprofen (ADVIL,MOTRIN) 200 MG tablet Take 600 mg by mouth every 6 (six) hours as needed for mild pain. Pain   Yes Historical Provider, MD  Multiple Vitamins-Minerals (ONE-A-DAY ENERGY PO) Take 1 tablet by mouth daily.   Yes Historical Provider, MD   BP 140/68  Pulse 91  Temp(Src) 99.9 F (37.7 C) (Oral)  Resp 23  Ht 5\' 4"  (1.626 m)  Wt 145 lb (65.772 kg)  BMI 24.88 kg/m2  SpO2 98%  LMP 01/30/2014 Physical Exam  Nursing note and vitals reviewed. Constitutional: She is oriented to person, place, and time. She appears well-developed and well-nourished. She does not have a sickly appearance. She appears ill. She appears distressed.  Febrile and tachycardic in the 90s-100s, warm to touch, ill appearing and distressed, clearly not feeling well  HENT:  Head: Normocephalic and atraumatic.  Mouth/Throat: Oropharynx is clear and moist. Mucous membranes are dry.  Mucous membranes slightly dry  Eyes: Conjunctivae and EOM are normal. Pupils are equal, round, and reactive to light. Right eye exhibits no discharge. Left eye exhibits no discharge.  Neck: Normal range of motion. Neck supple. Normal carotid pulses  present. No spinous process tenderness and no muscular tenderness present. Carotid bruit is not present. No rigidity. Normal range of motion present.  FROM intact, no spinous process TTP or paraspinous muscle TTP, no rigidity or meningeal signs. No carotid bruit  Cardiovascular: Regular rhythm, normal heart sounds and intact distal pulses.  Tachycardia present.  Exam reveals no gallop and no friction rub.   No murmur heard. Pulses:      Radial pulses are 2+ on the right side, and 2+ on the left side.       Dorsalis pedis pulses are 2+ on the right side, and 2+ on the left side.  Tachycardic in the 90s-100s, regular rhythm. No M/R/G. Distal pulses intact and equal bilaterally  Pulmonary/Chest: Effort normal and breath sounds normal. No respiratory distress. She has no decreased breath sounds. She has no wheezes. She has no rales.  CTAB in all lung fields  Abdominal: Soft. Normal appearance and bowel sounds are normal. She exhibits no distension, no abdominal bruit and no pulsatile midline mass. There is tenderness in the suprapubic area and left lower quadrant. There is CVA tenderness. There is no rigidity, no rebound, no guarding, no tenderness at McBurney's point and negative Murphy's sign.    Midline prior csection scar below umbilicus. Soft, nondistended, +BS throughout. TTP in suprapubic and LLQ, +  CVA TTP on L. No midline pulsatile mass or bruit. No rebound/guarding/rigidity.  Genitourinary: Uterus normal. Pelvic exam was performed with patient supine. There is no rash, tenderness or lesion on the right labia. There is no rash, tenderness or lesion on the left labia. Cervix exhibits no motion tenderness, no discharge and no friability. Right adnexum displays no mass, no tenderness and no fullness. Left adnexum displays tenderness. Left adnexum displays no mass and no fullness. There is tenderness and bleeding around the vagina. No erythema around the vagina. No foreign body around the vagina.    Mild vaginal bleeding with old dark blood noted in vaginal vault. Pt extremely uncomfortable with exam, difficult to insert speculum fully. Left adnexal TTP with no fullness or masses palpable, but again pt very uncooperative with exam. No CMT, cervical discharge or friability. Uterus midline without obvious enlargement or tenderness but again pt very uncooperative with exam.   Musculoskeletal: Normal range of motion.       Lumbar back: She exhibits pain (L CVA). She exhibits no bony tenderness, no swelling, no edema and no spasm.  C/T spine non-TTP along midline spinous processes and paraspinous muscles, with FROM intact.  Lumbar spine non-TTP along midline spinous processes but L flank/paraspinous muscle area tender near CVA area, with no spasms noted. FROM intact. No bony deformity or crepitus in any spinal level. Strength 5/5 in all extremities. Neg Homan's sign. Cap refill <3 secs in all digits, distal pulses intact. Sensation grossly intact.  Neurological: She is alert and oriented to person, place, and time. She has normal strength. No cranial nerve deficit or sensory deficit.  Sensation grossly intact in all extremities. CNII-XII grossly intact. Strength 5/5 in all extremities.  Skin: Skin is warm and intact. No rash noted. She is diaphoretic.  Clammy and warm to touch  Psychiatric: She has a normal mood and affect.    ED Course  Procedures (including critical care time) Labs Review Labs Reviewed  WET PREP, GENITAL - Abnormal; Notable for the following:    WBC, Wet Prep HPF POC FEW (*)    All other components within normal limits  COMPREHENSIVE METABOLIC PANEL - Abnormal; Notable for the following:    Potassium 2.9 (*)    Chloride 92 (*)    Albumin 3.4 (*)    GFR calc non Af Amer 86 (*)    Anion gap 20 (*)    All other components within normal limits  URINALYSIS, ROUTINE W REFLEX MICROSCOPIC - Abnormal; Notable for the following:    Color, Urine AMBER (*)    APPearance CLOUDY  (*)    Hgb urine dipstick LARGE (*)    Ketones, ur 15 (*)    Urobilinogen, UA 2.0 (*)    Leukocytes, UA LARGE (*)    All other components within normal limits  CBC WITH DIFFERENTIAL - Abnormal; Notable for the following:    HCT 35.0 (*)    Monocytes Relative 15 (*)    Monocytes Absolute 1.1 (*)    All other components within normal limits  URINE CULTURE  GC/CHLAMYDIA PROBE AMP  LIPASE, BLOOD  URINE MICROSCOPIC-ADD ON  PREGNANCY, URINE  I-STAT CG4 LACTIC ACID, ED    Imaging Review US Transvaginal Non-ob  02/04/2014   CLINICAL DATA:  Left lower quadrant pain.  EXAM: TRANSABDOMINAL AND TRANSVAGINAL ULTRASOUND OF PELVIS  DOPPLER ULTRASOUND OF OVARIES  TECHNIQUE: Both transabdominal and transvaginal ultrasound examinations of the pelvis were performed. Transabdominal technique was performed for global imaging of the pelvis  including uterus, ovaries, adnexal regions, and pelvic cul-de-sac.  It was necessary to proceed with endovaginal exam following the transabdominal exam to visualize the ovaries. Color and duplex Doppler ultrasound was utilized to evaluate blood flow to the ovaries.  COMPARISON:  Ultrasound of January 30, 2014.  FINDINGS: Uterus  Measurements: 12.6 x 6.4 x 5.6 cm. Fibroid is noted in posterior portion of fundus measuring 2.9 x 2.4 x 2.0 cm.  Endometrium  Thickness: Measures 10 mm which is within normal limits. No focal abnormality visualized.  Right ovary  Right ovary is not visualized on this study, which was the case on prior exam as well.  Left ovary  Measurements: 4.3 x 3.7 x 3.4 cm. 3.9 x 3.4 x 2.9 cm mildly septated cyst is noted in left ovary.  Pulsed Doppler evaluation of left ovary demonstrates normal low-resistance arterial and venous waveforms.  Other findings  No free fluid.  IMPRESSION: Right ovary is not visualized and therefore pathology in this area cannot per ruled out. No right adnexal abnormality is noted. 2.9 cm fibroid is noted in the uterus.  3.9 cm mildly  septated cyst is noted in left ovary which is slightly increased in size compared to prior exam. Followup ultrasound in 6-8 weeks is recommended to ensure resolution or stability.   Electronically Signed   By: Roque Lias M.D.   On: 02/04/2014 15:43   US Pelvis Complete  02/04/2014   CLINICAL DATA:  Left lower quadrant pain.  EXAM: TRANSABDOMINAL AND TRANSVAGINAL ULTRASOUND OF PELVIS  DOPPLER ULTRASOUND OF OVARIES  TECHNIQUE: Both transabdominal and transvaginal ultrasound examinations of the pelvis were performed. Transabdominal technique was performed for global imaging of the pelvis including uterus, ovaries, adnexal regions, and pelvic cul-de-sac.  It was necessary to proceed with endovaginal exam following the transabdominal exam to visualize the ovaries. Color and duplex Doppler ultrasound was utilized to evaluate blood flow to the ovaries.  COMPARISON:  Ultrasound of January 30, 2014.  FINDINGS: Uterus  Measurements: 12.6 x 6.4 x 5.6 cm. Fibroid is noted in posterior portion of fundus measuring 2.9 x 2.4 x 2.0 cm.  Endometrium  Thickness: Measures 10 mm which is within normal limits. No focal abnormality visualized.  Right ovary  Right ovary is not visualized on this study, which was the case on prior exam as well.  Left ovary  Measurements: 4.3 x 3.7 x 3.4 cm. 3.9 x 3.4 x 2.9 cm mildly septated cyst is noted in left ovary.  Pulsed Doppler evaluation of left ovary demonstrates normal low-resistance arterial and venous waveforms.  Other findings  No free fluid.  IMPRESSION: Right ovary is not visualized and therefore pathology in this area cannot per ruled out. No right adnexal abnormality is noted. 2.9 cm fibroid is noted in the uterus.  3.9 cm mildly septated cyst is noted in left ovary which is slightly increased in size compared to prior exam. Followup ultrasound in 6-8 weeks is recommended to ensure resolution or stability.   Electronically Signed   By: Roque Lias M.D.   On: 02/04/2014 15:43   Ct  Abdomen Pelvis W Contrast  02/04/2014   CLINICAL DATA:  Left abdominal pain with diaphoresis and vomiting for 2 weeks.  EXAM: CT ABDOMEN AND PELVIS WITH CONTRAST  TECHNIQUE: Multidetector CT imaging of the abdomen and pelvis was performed using the standard protocol following bolus administration of intravenous contrast.  CONTRAST:  OMNIPAQUE IOHEXOL 300 MG/ML  SOLN  COMPARISON:  Pelvic ultrasound today. Abdominal pelvic CT 01/30/2014.  FINDINGS: Lung bases: New mild subsegmental atelectasis at both lung bases. There is a stable calcified right lower lobe granuloma. No consolidation, pleural or pericardial effusion present.  Liver: Normal in density without focal abnormality.  Gallbladder/Biliary system: No gallbladder wall thickening, calcified gallstones or biliary dilatation.  Pancreas: Unremarkable.  Spleen: Unremarkable.  Adrenal glands: No evidence of adrenal mass.  Kidneys/Ureters/Bladder: Several small low-density right renal lesions are not well seen on the prior noncontrast study. These are suboptimally characterized based on size but are probably cysts. There is a tiny low-density lesion in the lower pole of the left kidney. No hydronephrosis, urinary tract calculus or bladder abnormality identified.  Bowel: The stomach, small bowel and appendix appear normal. The proximal colon is opacified with contrast. There are diverticular changes of the sigmoid colon without definite wall thickening. Inflammatory process in the left lower quadrant does not appear to be directly related to the sigmoid colon.  Peritoneum: No ascites or peritoneal nodularity.  Lymph Nodes: Small retroperitoneal and pelvic lymph nodes are likely reactive, not pathologically enlarged.  Vascular Structures: Stable mild atherosclerosis.  Reproductive Organs: The right ovary in uterus demonstrate no significant findings. There is an enlarging complex left adnexal process with a tubular appearing low-density structure, most  consistent with a dilated fallopian tube. This demonstrates mild wall thickening and surrounding soft tissue stranding suspicious for a high pyosalpinx. Overall, this measures up to 4.8 cm transverse and 5.7 cm cephalocaudad. There is no focal extraluminal fluid collection. No signs of torsion were seen on earlier ultrasound.  Abdominal wall:  No evidence of abdominal wall hernia.  Musculoskeletal: No acute osseous findings. There are facet degenerative changes throughout the lumbar spine and asymmetric right hip osteoarthritic changes.  IMPRESSION: 1. As demonstrated on ultrasound performed today, there is progressive enlargement of a low-density left adnexal lesion which appears complex with wall thickening and surrounding soft tissue stranding on CT. This appearance is most consistent with pyosalpinx. 2. Although this process abuts the sigmoid colon, no definite signs of underlying diverticulitis identified. 3. Low-density renal lesions are incompletely characterized based on size, but probably cysts. No hydronephrosis. 4. Mild bibasilar pulmonary atelectasis.   Electronically Signed   By: Roxy Horseman M.D.   On: 02/04/2014 19:34   [place] Art/ven Flow Abd Pelv Doppler  02/04/2014   CLINICAL DATA:  Left lower quadrant pain.  EXAM: TRANSABDOMINAL AND TRANSVAGINAL ULTRASOUND OF PELVIS  DOPPLER ULTRASOUND OF OVARIES  TECHNIQUE: Both transabdominal and transvaginal ultrasound examinations of the pelvis were performed. Transabdominal technique was performed for global imaging of the pelvis including uterus, ovaries, adnexal regions, and pelvic cul-de-sac.  It was necessary to proceed with endovaginal exam following the transabdominal exam to visualize the ovaries. Color and duplex Doppler ultrasound was utilized to evaluate blood flow to the ovaries.  COMPARISON:  Ultrasound of January 30, 2014.  FINDINGS: Uterus  Measurements: 12.6 x 6.4 x 5.6 cm. Fibroid is noted in posterior portion of fundus measuring 2.9 x 2.4 x 2.0  cm.  Endometrium  Thickness: Measures 10 mm which is within normal limits. No focal abnormality visualized.  Right ovary  Right ovary is not visualized on this study, which was the case on prior exam as well.  Left ovary  Measurements: 4.3 x 3.7 x 3.4 cm. 3.9 x 3.4 x 2.9 cm mildly septated cyst is noted in left ovary.  Pulsed Doppler evaluation of left ovary demonstrates normal low-resistance arterial and venous waveforms.  Other findings  No free fluid.  IMPRESSION: Right  ovary is not visualized and therefore pathology in this area cannot per ruled out. No right adnexal abnormality is noted. 2.9 cm fibroid is noted in the uterus.  3.9 cm mildly septated cyst is noted in left ovary which is slightly increased in size compared to prior exam. Followup ultrasound in 6-8 weeks is recommended to ensure resolution or stability.   Electronically Signed   By: Roque Lias M.D.   On: 02/04/2014 15:43     EKG Interpretation None      MDM   Final diagnoses:  Hypokalemia    Kathleen [name] is a 48 y.o. female with a PMHx of HTN presenting today for ongoing abd pain which is now more in the LLQ/L flank, with onset of urinary symptoms, fever, and muscle cramping. States her vaginal bleeding has lessened, and denies vaginal symptoms. Had an extensive work up yesterday for abd pain, including vaginal U/S which revealed L ovarian cyst and fibroid, as well as abd CT which revealed similar findings. Pelvic was performed and only noted to have profuse vaginal bleeding which obstructed visualization of anatomy but no mention of tenderness or adnexal fullness. U/A at that time was negative. Exam today reveals LLQ/L CVA TTP, pt clammy and warm to touch although triage temp noted to be 99.109F, pt persistently tachycardic, and distressed appearing although non-toxic. DDx includes early pyelo, ovarian cyst vs torsion or ruptured cyst. Given that she was worked up for pelvic etiology 5 days ago, will obtain labs and repeat U/A  and look for changes, then decide on further imaging. Will start IV, give morphine, zofran, and fluids and reassess when results return.  12:52 PM Lab result called to nurse, K of 2.9. Will get EKG, replete orally with and IV with . CBC relatively unremarkable, lipase WNL, Cl low at 92, anion gap of 20 with bicarb WNL. U/A revealing amber, cloudy urine w/ large hgb, ketonuria, urobilinogen, large leuks, 21-50 WBC, rare bacteria, and +trich. This could represent UTI/early pyelo. Will obtain rectal temp to see if oral temp was inaccurate, given that I have a high suspicion for early pyelo.  1:25 PM Rectal temp is 100.7, and HR 90s-100s. Pt meets SIRS/Sepsis criteria. Will start rocephin to tx for early pyelo and consult hospitalist for admission for SIRS/Sepsis criteria and hypokalemia which will likely require repeat repletion. Dr. Rubin Payor stating to hold on calling code sepsis, but will obtain lactic acid level now. No cultures yet, given that pt has no white count.  2:00 PM Dr. Thedore Mins consulting, and believes pt's complaint is more pelvic than early pyelo and is concerned for ovarian cyst rupture or torsion, even in the setting of fever. Would like for [place] to obtain repeat transvaginal and abd U/S with doppler to r/o cyst rupture or ovarian torsion. He is accepting care, but pt will remain in ED during this work up. Would also like Upreg at this time, even with negative Upreg from 5 days prior.  3:05 PM Dr. Thedore Mins requesting that we obtain pelvic exam and swab for GC/CT as well as get a wet prep. Upon inspection of last visit's note, it appears that RPR/HIV was obtained but pelvic swabs were not performed or sent. Will perform pelvic when pt returns from U/S.  4:00 PM Pelvic difficult due to pt discomfort, mild bleeding in vaginal vault, no obvious discharge, L sided TTP but no CMT, but pt very uncooperative with exam and unable to fully evaluate vaginal vault/adnexa.  4:10 PM Dr. Thedore Mins  contacted regarding U/S being completed as well as results of pelvic exam. He wishes to proceed with CT abd/pelvis with contrast, but would like pt to remain in ED until this is completed. At this time, pt care is being turned over to him, although she remains in the ED but has no further emergency dept needs and is simply awaiting CT and then will be admitted to the floor, per Dr. Thedore MinsSingh. Please see his dictation for further documentation of pt's care.  BP 140/68  Pulse 91  Temp(Src) 99.9 F (37.7 C) (Oral)  Resp 23  Ht 5\' 4"  (1.626 m)  Wt 145 lb (65.772 kg)  BMI 24.88 kg/m2  SpO2 98%  LMP 01/30/2014   Donnita FallsMercedes Strupp Camprubi-Soms, PA-C 02/04/14 2321

## 2014-02-04 NOTE — ED Notes (Signed)
She states she was just here for the same symptoms and they are not any better. She states "my whole L side of my body is hurting and tingly, i keep getting sweaty. i have no appetite, im vomiting, im exhausted.'

## 2014-02-04 NOTE — ED Notes (Signed)
Rectal temp per PA.   NT to do.

## 2014-02-04 NOTE — ED Provider Notes (Signed)
8:00 PM  Assumed care from Dr. Rubin Payor.  Pt is a 48 y.o. F with history of hypertension who came to the emergency department with left lower abdominal pain, fevers, vomiting. Patient's urine shows leukocytes, hemoglobin Concerta possible UTI. Patient was also found to be afebrile, tachycardic and hypokalemic. Pelvic exam revealed some left adnexal tenderness. A transvaginal ultrasound was ordered and showed a 3.9 cm mildly septated cyst of the left ovary. Patient was admitted to hospitalist service. Hospitalist evaluated patient and requested CT of her abdomen and pelvis. CT scan shows a left pyosalpinx. Discussed with Dr. Debroah Loop with OB/GYN who agrees to accept the patient in transfer to [facility] hospital. She has received IV ceftriaxone, doxycycline and Flagyl in the emergency department. She is hemodynamically stable. Patient comfortable with this plan.   She is sexually active with her husband of 23 years. She denies a history of STDs. Her husband however states that he was recently treated for urinary tract infection and does become very upset when I ask him to leave the room so I can ask his wife questions in private.  Layla Maw Ward, DO 02/04/14 2350

## 2014-02-04 NOTE — H&P (Addendum)
Patient Demographics  Kathleen [name], is a 48 y.o. female  MRN: [id number]   DOB - 1965-09-05  Admit Date - 02/04/2014  Outpatient Primary MD for the patient is No PCP Per Patient   With History of -  History reviewed. No pertinent past medical history.    Past Surgical History  Procedure Laterality Date  . Cesarean section      in for   Chief Complaint  Patient presents with  . Emesis  . Fatigue     HPI  Kathleen [name]  is a 48 y.o. female, history of hypertension, C-section in the past, not on any medications who basically came to the ER a few days ago for left lower quadrant abdominal pain and mild dysuria, she had extensive workup which included CT scan abdomen pelvis, pelvic ultrasound, abdominal and pelvis Doppler ultrasound, workup at that time was consistent with a UTI/pyelonephritis, left ovarian cyst was also diagnosed, she is a small supraumbilical ventral hernia, she was discharged home after supportive care and treatment in the ER. However her symptoms did not improve and she continued to have left lower quadrant abdominal pain, left-sided flank pain, persistent nausea vomiting and came to the ER and again today.  In the ER today workup consistent with pyelonephritis, hyperkalemia and I was called to admit the patient.    Review of Systems    In addition to the HPI above,   No Fever-chills, No Headache, No changes with Vision or hearing, No problems swallowing food or Liquids, No Chest pain, Cough or Shortness of Breath, ++LLQ Abdominal pain, + Nausea & Vommitting, Bowel movements are regular, No Blood in stool or Urine, No dysuria, No new skin rashes or bruises, No new joints pains-aches,  No new weakness, tingling, numbness in any extremity, No recent weight gain or loss, No polyuria,  polydypsia or polyphagia, No significant Mental Stressors.  A full 10 point Review of Systems was done, except as stated above, all other Review of Systems were negative.   Social History History  Substance Use Topics  . Smoking status: Current Every Day Smoker    Types: Cigarettes  . Smokeless tobacco: Not on file  . Alcohol Use: No      Family History No H/O Pyelonephritis   Prior to Admission medications   Medication Sig Start Date End Date Taking? Authorizing Provider  hydrochlorothiazide (HYDRODIURIL) 12.5 MG tablet Take 2 tablets (25 mg total) by mouth daily. 01/31/14  Yes Clement Sayres, MD  HYDROcodone-acetaminophen (NORCO/VICODIN) 5-325 MG per tablet Take 1 tablet by mouth every 4 (four) hours as needed for moderate pain or severe pain. 01/30/14  Yes Clement Sayres, MD  ibuprofen (ADVIL,MOTRIN) 200 MG tablet Take 600 mg by mouth every 6 (six) hours as needed for mild pain. Pain   Yes Historical Provider, MD  Multiple Vitamins-Minerals (ONE-A-DAY ENERGY PO) Take 1 tablet by mouth daily.   Yes Historical Provider, MD    Allergies  Allergen  Reactions  . Bee Venom Anaphylaxis and Swelling    Physical Exam  Vitals  Blood pressure 151/89, pulse 104, temperature 100.7 F (38.2 C), temperature source Rectal, resp. rate 18, height 5\' 4"  (1.626 m), weight 65.772 kg (145 lb), last menstrual period 01/30/2014, SpO2 100.00%.   1. General middle aged AA female lying in bed in moderate ABd pain  2. Normal affect and insight, Not Suicidal or Homicidal, Awake Alert, Oriented X 3.  3. No F.N deficits, ALL C.Nerves Intact, Strength 5/5 all 4 extremities, Sensation intact all 4 extremities, Plantars down going.  4. Ears and Eyes appear Normal, Conjunctivae clear, PERRLA. Moist Oral Mucosa.  5. Supple Neck, No JVD, No cervical lymphadenopathy appriciated, No Carotid Bruits.  6. Symmetrical Chest wall movement, Good air movement bilaterally, CTAB.  7. RRR, No Gallops, Rubs or  Murmurs, No Parasternal Heave.  8. Positive Bowel Sounds, Abdomen Soft, +LLQ/L Flank tenderness, No organomegaly appriciated,No rebound -guarding or rigidity.  9.  No Cyanosis, Normal Skin Turgor, No Skin Rash or Bruise.  10. Good muscle tone,  joints appear normal , no effusions, Normal ROM.  11. No Palpable Lymph Nodes in Neck or Axillae     Data Review  CBC  Recent Labs Lab 01/30/14 1550 02/04/14 1150  WBC 5.0 7.4  HGB 13.4 12.1  HCT 39.1 35.0*  PLT 341 384  MCV 87.5 87.7  MCH 30.0 30.3  MCHC 34.3 34.6  RDW 14.7 14.7  LYMPHSABS 3.3 1.4  MONOABS 0.5 1.1*  EOSABS 0.2 0.1  BASOSABS 0.0 0.0   ------------------------------------------------------------------------------------------------------------------  Chemistries   Recent Labs Lab 01/30/14 1550 02/04/14 1150  NA 138 138  K 3.3* 2.9*  CL 100 92*  CO2 21 26  GLUCOSE 145* 99  BUN 9 16  CREATININE 0.57 0.80  CALCIUM 9.9 9.8  AST 18 25  ALT 16 21  ALKPHOS 92 99  BILITOT <0.2* 0.5   ------------------------------------------------------------------------------------------------------------------ estimated creatinine clearance is 80.2 ml/min (by C-G formula based on Cr of 0.8). ------------------------------------------------------------------------------------------------------------------ No results found for this basename: TSH, T4TOTAL, FREET3, T3FREE, THYROIDAB,  in the last 72 hours   Coagulation profile No results found for this basename: INR, PROTIME,  in the last 168 hours ------------------------------------------------------------------------------------------------------------------- No results found for this basename: DDIMER,  in the last 72 hours -------------------------------------------------------------------------------------------------------------------  Cardiac Enzymes No results found for this basename: CK, CKMB, TROPONINI, MYOGLOBIN,  in the last 168  hours ------------------------------------------------------------------------------------------------------------------ No components found with this basename: POCBNP,    ---------------------------------------------------------------------------------------------------------------  Urinalysis    Component Value Date/Time   COLORURINE AMBER* 02/04/2014 1158   APPEARANCEUR CLOUDY* 02/04/2014 1158   LABSPEC 1.022 02/04/2014 1158   PHURINE 5.5 02/04/2014 1158   GLUCOSEU NEGATIVE 02/04/2014 1158   HGBUR LARGE* 02/04/2014 1158   BILIRUBINUR NEGATIVE 02/04/2014 1158   KETONESUR 15* 02/04/2014 1158   PROTEINUR NEGATIVE 02/04/2014 1158   UROBILINOGEN 2.0* 02/04/2014 1158   NITRITE NEGATIVE 02/04/2014 1158   LEUKOCYTESUR LARGE* 02/04/2014 1158    ----------------------------------------------------------------------------------------------------------------  Imaging results:   Ct Abdomen Pelvis Wo Contrast  01/30/2014   CLINICAL DATA:  Right-sided abdominal pain, radiating into the lower back. Nausea and vomiting.  EXAM: CT ABDOMEN AND PELVIS WITHOUT CONTRAST  TECHNIQUE: Multidetector CT imaging of the abdomen and pelvis was performed following the standard protocol without IV contrast.  COMPARISON:  None.  FINDINGS: Normal heart size. Calcified granuloma right lower lobe. Linear opacity within the left lower lobe, favored to reflect atelectasis or scar.  Intra-abdominal organ evaluation is limited  in the absence of intravenous contrast. Within this limitation, no appreciable abnormality of the liver, biliary ducts, spleen, pancreas, adrenal glands. Gallbladder sludge or noncalcified stones. No pericholecystic fat stranding or fluid.  Symmetric renal size. No urinary tract calculi. No hydroureteronephrosis.  Decompressed colon limits evaluation. No overt colitis. Normal appendix. Small supraumbilical ventral hernia containing the anterior wall of a segment of transverse colon. No CT evidence for obstruction  or incarceration. Small bowel loops for of normal course and caliber. No free intraperitoneal air or fluid. No lymphadenopathy.  Mild scattered atherosclerotic disease of the aorta and branch vessels without aneurysmal dilatation.  Lobular uterine contour. Intra-uterine high attenuation suggests blood products. 2.4 cm left adnexal cyst is favored to be physiologic. No right adnexal mass.  Incompletely distended bladder. Small fat containing right inguinal hernia.  Left greater than right SI joint degenerative changes. Multilevel facet arthropathy of the lumbar spine. No acute osseous finding.  IMPRESSION: Enlarged uterus with lobular contour. Intrauterine high attenuation may reflect blood products. Correlate with pelvic exam and pelvic ultrasound.  Gallbladder sludge and/or noncalcified stones. No pericholecystic fat stranding or fluid.  Small supraumbilical ventral hernia containing the anterior wall of a segment of transverse colon. No CT evidence for obstruction or incarceration.   Electronically Signed   By: Jearld Lesch M.D.   On: 01/30/2014 18:30   US Transvaginal Non-ob  01/30/2014   CLINICAL DATA:  Pelvic pain  EXAM: TRANSABDOMINAL AND TRANSVAGINAL ULTRASOUND OF PELVIS  TECHNIQUE: Both transabdominal and transvaginal ultrasound examinations of the pelvis were performed. Transabdominal technique was performed for global imaging of the pelvis including uterus, ovaries, adnexal regions, and pelvic cul-de-sac. It was necessary to proceed with endovaginal exam following the transabdominal exam to visualize the ovaries.  COMPARISON:  CT from earlier in the same day  FINDINGS: Uterus  Measurements: 12.6 x 6.3 x 7.7 cm. No fibroids or other mass visualized.  Endometrium  Thickness: 18 mm in thickness. Complex nature to the endometrium is noted. By history patient is currently on her menstrual cycle in these changes may be related to and menstrual cycle.  Right ovary  Not well visualized  Left ovary   Measurements: 2.7 x 2.4 x 2.4 cm. Prominent 2.7 cm cyst is noted within the left ovary similar to that seen on prior CT.  Other findings  No free fluid.  IMPRESSION: Heterogeneous endometrium which is prominent. This is likely related the patient's current mental status. Clinical correlation is recommended.  Nonvisualization the right ovary.  Left ovarian cysts.   Electronically Signed   By: Alcide Clever M.D.   On: 01/30/2014 21:41   US Pelvis Complete  01/30/2014   CLINICAL DATA:  Pelvic pain  EXAM: TRANSABDOMINAL AND TRANSVAGINAL ULTRASOUND OF PELVIS  TECHNIQUE: Both transabdominal and transvaginal ultrasound examinations of the pelvis were performed. Transabdominal technique was performed for global imaging of the pelvis including uterus, ovaries, adnexal regions, and pelvic cul-de-sac. It was necessary to proceed with endovaginal exam following the transabdominal exam to visualize the ovaries.  COMPARISON:  CT from earlier in the same day  FINDINGS: Uterus  Measurements: 12.6 x 6.3 x 7.7 cm. No fibroids or other mass visualized.  Endometrium  Thickness: 18 mm in thickness. Complex nature to the endometrium is noted. By history patient is currently on her menstrual cycle in these changes may be related to and menstrual cycle.  Right ovary  Not well visualized  Left ovary  Measurements: 2.7 x 2.4 x 2.4 cm. Prominent 2.7 cm cyst  is noted within the left ovary similar to that seen on prior CT.  Other findings  No free fluid.  IMPRESSION: Heterogeneous endometrium which is prominent. This is likely related the patient's current mental status. Clinical correlation is recommended.  Nonvisualization the right ovary.  Left ovarian cysts.   Electronically Signed   By: Alcide Clever M.D.   On: 01/30/2014 21:41   [place] Art/ven Flow Abd Pelv Doppler  01/31/2014   CLINICAL DATA:  Pelvic pain  EXAM: DOPPLER EVALUATION OF THE OVARIES  COMPARISON:  Transabdominal and transvaginal pelvic ultrasound January 30, 2014  FINDINGS: Both  ovaries have low resistance waveforms. The peak systolic velocity in the left ovary is 6.9 cm/sec. The peak systolic velocity in the right ovary is 3.8 cm/sec. There is venous outflow demonstrated bilaterally.  IMPRESSION: No demonstrable ovarian torsion. Expected low resistance waveforms bilaterally. Venous outflow demonstrated bilaterally.   Electronically Signed   By: Bretta Bang M.D.   On: 01/31/2014 01:02        Assessment & Plan   1. Left lower quadrant abdominal pain with fever. Could be sepsis secondary to pyelonephritis, however her pain is more in the left lower quadrant than in the flank, she recently had left ovarian cyst, at this time I have ordered pelvic ultrasound along with pelvic Doppler to rule out ruptured ovarian cyst or torsion of ovary/PID, urine and blood cultures drawn, IV Rocephin to be continued for pyelonephritis. We'll continue IV fluids. And monitor the results of ultrasound.   Have requested ER physician Dr. Rubin Payor to complete a pelvic exam and send out GC chlamydia, she had pelvic exam few days ago which was unremarkable and the HIV and RPR screen was negative. Urine pregnancy was -ve 3 days ago, for now place on Rocephin + Doxy.     Addendum - [place] results back and inconclusive, still convinced this is a Pelvic pathology, stat CT ordered with IV contrast - results suspicious for Pyosalpinx, added IV Flagyl - patient still ER status as I had requested not to transfer to the floor till we obtain a satisfactory workup. D/W covering EDP Dr Elesa Massed who has called GYN MD on call.  Patient will be seen ASAP by GYN either here or transferred to [facility].      2. Nausea vomiting with hyperkalemia due to #1 above. IV fluids, IV Zofran when necessary, replete potassium. Recheck potassium and magnesium this evening.      3. Hypertension. Pain control #1, as needed IV hydralazine.      DVT Prophylaxis  Lovenox    AM Labs Ordered, also please review Full  Orders  Family Communication: Admission, patients condition and plan of care including tests being ordered have been discussed with the patient who indicates understanding and agree with the plan and Code Status.  Code Status Full  Likely DC to  Home  Condition Fair   Time spent in minutes : 35    Marletta Bousquet K M.D on 02/04/2014 at 2:11 PM  Between 7am to 7pm - Pager - [phone]  After 7pm go to [email or web address] - password [email or web address]  And look for the night coverage person covering me after hours  [facility] Office  [phone]   **Disclaimer: This note may have been dictated with voice recognition software. Similar sounding words can inadvertently be transcribed and this note may contain transcription errors which may not have been corrected upon publication of note.**

## 2014-02-04 NOTE — H&P (Signed)
Kathleen [name] is an 48 y.o. female. [id number] Patient's last menstrual period was 01/30/2014. Presented to [facility] 01/30/14 with onset of LLQ pain. An ovarian cyst was seen on [place] and she was treated for PID with rocephin and azithromycin but her pain worsened and she returned to the ED today with decreased appetite and fever. [place] and CT showed a left pyosalpinx and she was transferred to [facility] for management.    Pertinent Gynecological History: Menses: flow is moderate Bleeding: menstrual  Contraception: none DES exposure: denies Blood transfusions: none Sexually transmitted diseases: trichomonas diagnosed this episode Previous GYN Procedures: none   OB History: [id number]   Menstrual History:  Patient's last menstrual period was 01/30/2014.Scant bleeding now    Past Medical History  Diagnosis Date  . Hypertension     Past Surgical History  Procedure Laterality Date  . Cesarean section      3 cesarean sections    History reviewed. No pertinent family history.  Social History:  reports that she has been smoking Cigarettes.  She has been smoking about 0.50 packs per day. She does not have any smokeless tobacco history on file. She reports that she does not drink alcohol or use illicit drugs.  Allergies:  Allergies  Allergen Reactions  . Bee Venom Anaphylaxis and Swelling    Prescriptions prior to admission  Medication Sig Dispense Refill  . hydrochlorothiazide (HYDRODIURIL) 12.5 MG tablet Take 2 tablets (25 mg total) by mouth daily.  14 tablet  0  . HYDROcodone-acetaminophen (NORCO/VICODIN) 5-325 MG per tablet Take 1 tablet by mouth every 4 (four) hours as needed for moderate pain or severe pain.  10 tablet  0  . ibuprofen (ADVIL,MOTRIN) 200 MG tablet Take 600 mg by mouth every 6 (six) hours as needed for mild pain. Pain      . Multiple Vitamins-Minerals (ONE-A-DAY ENERGY PO) Take 1 tablet by mouth daily.        Review of Systems  Constitutional: Positive for  fever.  HENT: Negative.   Cardiovascular: Negative.   Gastrointestinal: Positive for abdominal pain. Negative for constipation.  Genitourinary: Positive for dysuria and urgency.  Psychiatric/Behavioral: Negative.     Blood pressure 140/68, pulse 91, temperature 99.9 F (37.7 C), temperature source Oral, resp. rate 23, height 5\' 4"  (1.626 m), weight 145 lb (65.772 kg), last menstrual period 01/30/2014, SpO2 98.00%. Physical Exam  Constitutional: She is oriented to person, place, and time. She appears well-developed. No distress.  obese  Neck: Normal range of motion.  Cardiovascular: Normal rate and normal heart sounds.   Respiratory: Effort normal and breath sounds normal.  GI: She exhibits no mass. There is tenderness (mild llq tenderness). There is no rebound and no guarding.  Neurological: She is alert and oriented to person, place, and time.  Skin: Skin is warm and dry.  Psychiatric: She has a normal mood and affect. Her behavior is normal.    Results for orders placed during the hospital encounter of 02/04/14 (from the past 24 hour(s))  COMPREHENSIVE METABOLIC PANEL     Status: Abnormal   Collection Time    02/04/14 11:50 AM      Result Value Ref Range   Sodium 138  137 - 147 mEq/L   Potassium 2.9 (*) 3.7 - 5.3 mEq/L   Chloride 92 (*) 96 - 112 mEq/L   CO2 26  19 - 32 mEq/L   Glucose, Bld 99  70 - 99 mg/dL   BUN 16  6 - 23 mg/dL  Creatinine, Ser 0.80  0.50 - 1.10 mg/dL   Calcium 9.8  8.4 - 53.6 mg/dL   Total Protein 7.9  6.0 - 8.3 g/dL   Albumin 3.4 (*) 3.5 - 5.2 g/dL   AST 25  0 - 37 U/L   ALT 21  0 - 35 U/L   Alkaline Phosphatase 99  39 - 117 U/L   Total Bilirubin 0.5  0.3 - 1.2 mg/dL   GFR calc non Af Amer 86 (*) >90 mL/min   GFR calc Af Amer >90  >90 mL/min   Anion gap 20 (*) 5 - 15  CBC WITH DIFFERENTIAL     Status: Abnormal   Collection Time    02/04/14 11:50 AM      Result Value Ref Range   WBC 7.4  4.0 - 10.5 K/uL   RBC 3.99  3.87 - 5.11 MIL/uL    Hemoglobin 12.1  12.0 - 15.0 g/dL   HCT 14.4 (*) 31.5 - 40.0 %   MCV 87.7  78.0 - 100.0 fL   MCH 30.3  26.0 - 34.0 pg   MCHC 34.6  30.0 - 36.0 g/dL   RDW 86.7  61.9 - 50.9 %   Platelets 384  150 - 400 K/uL   Neutrophils Relative % 66  43 - 77 %   Neutro Abs 4.9  1.7 - 7.7 K/uL   Lymphocytes Relative 18  12 - 46 %   Lymphs Abs 1.4  0.7 - 4.0 K/uL   Monocytes Relative 15 (*) 3 - 12 %   Monocytes Absolute 1.1 (*) 0.1 - 1.0 K/uL   Eosinophils Relative 1  0 - 5 %   Eosinophils Absolute 0.1  0.0 - 0.7 K/uL   Basophils Relative 0  0 - 1 %   Basophils Absolute 0.0  0.0 - 0.1 K/uL  LIPASE, BLOOD     Status: None   Collection Time    02/04/14 11:50 AM      Result Value Ref Range   Lipase 26  11 - 59 U/L  URINALYSIS, ROUTINE W REFLEX MICROSCOPIC     Status: Abnormal   Collection Time    02/04/14 11:58 AM      Result Value Ref Range   Color, Urine AMBER (*) YELLOW   APPearance CLOUDY (*) CLEAR   Specific Gravity, Urine 1.022  1.005 - 1.030   pH 5.5  5.0 - 8.0   Glucose, UA NEGATIVE  NEGATIVE mg/dL   Hgb urine dipstick LARGE (*) NEGATIVE   Bilirubin Urine NEGATIVE  NEGATIVE   Ketones, ur 15 (*) NEGATIVE mg/dL   Protein, ur NEGATIVE  NEGATIVE mg/dL   Urobilinogen, UA 2.0 (*) 0.0 - 1.0 mg/dL   Nitrite NEGATIVE  NEGATIVE   Leukocytes, UA LARGE (*) NEGATIVE  URINE MICROSCOPIC-ADD ON     Status: None   Collection Time    02/04/14 11:58 AM      Result Value Ref Range   Squamous Epithelial / LPF RARE  RARE   WBC, UA 21-50  <3 WBC/hpf   RBC / HPF 11-20  <3 RBC/hpf   Bacteria, UA RARE  RARE   Urine-Other MUCOUS PRESENT    PREGNANCY, URINE     Status: None   Collection Time    02/04/14 11:58 AM      Result Value Ref Range   Preg Test, Ur NEGATIVE  NEGATIVE  I-STAT CG4 LACTIC ACID, ED     Status: None   Collection Time  02/04/14  2:57 PM      Result Value Ref Range   Lactic Acid, Venous 1.41  0.5 - 2.2 mmol/L  WET PREP, GENITAL     Status: Abnormal   Collection Time    02/04/14   4:01 PM      Result Value Ref Range   Yeast Wet Prep HPF POC NONE SEEN  NONE SEEN   Trich, Wet Prep NONE SEEN  NONE SEEN   Clue Cells Wet Prep HPF POC NONE SEEN  NONE SEEN   WBC, Wet Prep HPF POC FEW (*) NONE SEEN  MAGNESIUM     Status: None   Collection Time    02/04/14  6:08 PM      Result Value Ref Range   Magnesium 1.7  1.5 - 2.5 mg/dL  POTASSIUM     Status: Abnormal   Collection Time    02/04/14  6:08 PM      Result Value Ref Range   Potassium 3.0 (*) 3.7 - 5.3 mEq/L    US Transvaginal Non-ob  02/04/2014   CLINICAL DATA:  Left lower quadrant pain.  EXAM: TRANSABDOMINAL AND TRANSVAGINAL ULTRASOUND OF PELVIS  DOPPLER ULTRASOUND OF OVARIES  TECHNIQUE: Both transabdominal and transvaginal ultrasound examinations of the pelvis were performed. Transabdominal technique was performed for global imaging of the pelvis including uterus, ovaries, adnexal regions, and pelvic cul-de-sac.  It was necessary to proceed with endovaginal exam following the transabdominal exam to visualize the ovaries. Color and duplex Doppler ultrasound was utilized to evaluate blood flow to the ovaries.  COMPARISON:  Ultrasound of January 30, 2014.  FINDINGS: Uterus  Measurements: 12.6 x 6.4 x 5.6 cm. Fibroid is noted in posterior portion of fundus measuring 2.9 x 2.4 x 2.0 cm.  Endometrium  Thickness: Measures 10 mm which is within normal limits. No focal abnormality visualized.  Right ovary  Right ovary is not visualized on this study, which was the case on prior exam as well.  Left ovary  Measurements: 4.3 x 3.7 x 3.4 cm. 3.9 x 3.4 x 2.9 cm mildly septated cyst is noted in left ovary.  Pulsed Doppler evaluation of left ovary demonstrates normal low-resistance arterial and venous waveforms.  Other findings  No free fluid.  IMPRESSION: Right ovary is not visualized and therefore pathology in this area cannot per ruled out. No right adnexal abnormality is noted. 2.9 cm fibroid is noted in the uterus.  3.9 cm mildly septated cyst  is noted in left ovary which is slightly increased in size compared to prior exam. Followup ultrasound in 6-8 weeks is recommended to ensure resolution or stability.   Electronically Signed   By: Roque Lias M.D.   On: 02/04/2014 15:43   US Pelvis Complete  02/04/2014   CLINICAL DATA:  Left lower quadrant pain.  EXAM: TRANSABDOMINAL AND TRANSVAGINAL ULTRASOUND OF PELVIS  DOPPLER ULTRASOUND OF OVARIES  TECHNIQUE: Both transabdominal and transvaginal ultrasound examinations of the pelvis were performed. Transabdominal technique was performed for global imaging of the pelvis including uterus, ovaries, adnexal regions, and pelvic cul-de-sac.  It was necessary to proceed with endovaginal exam following the transabdominal exam to visualize the ovaries. Color and duplex Doppler ultrasound was utilized to evaluate blood flow to the ovaries.  COMPARISON:  Ultrasound of January 30, 2014.  FINDINGS: Uterus  Measurements: 12.6 x 6.4 x 5.6 cm. Fibroid is noted in posterior portion of fundus measuring 2.9 x 2.4 x 2.0 cm.  Endometrium  Thickness: Measures 10 mm which is within  normal limits. No focal abnormality visualized.  Right ovary  Right ovary is not visualized on this study, which was the case on prior exam as well.  Left ovary  Measurements: 4.3 x 3.7 x 3.4 cm. 3.9 x 3.4 x 2.9 cm mildly septated cyst is noted in left ovary.  Pulsed Doppler evaluation of left ovary demonstrates normal low-resistance arterial and venous waveforms.  Other findings  No free fluid.  IMPRESSION: Right ovary is not visualized and therefore pathology in this area cannot per ruled out. No right adnexal abnormality is noted. 2.9 cm fibroid is noted in the uterus.  3.9 cm mildly septated cyst is noted in left ovary which is slightly increased in size compared to prior exam. Followup ultrasound in 6-8 weeks is recommended to ensure resolution or stability.   Electronically Signed   By: Roque Lias M.D.   On: 02/04/2014 15:43   Ct Abdomen Pelvis W  Contrast  02/04/2014   CLINICAL DATA:  Left abdominal pain with diaphoresis and vomiting for 2 weeks.  EXAM: CT ABDOMEN AND PELVIS WITH CONTRAST  TECHNIQUE: Multidetector CT imaging of the abdomen and pelvis was performed using the standard protocol following bolus administration of intravenous contrast.  CONTRAST:  OMNIPAQUE IOHEXOL 300 MG/ML  SOLN  COMPARISON:  Pelvic ultrasound today. Abdominal pelvic CT 01/30/2014.  FINDINGS: Lung bases: New mild subsegmental atelectasis at both lung bases. There is a stable calcified right lower lobe granuloma. No consolidation, pleural or pericardial effusion present.  Liver: Normal in density without focal abnormality.  Gallbladder/Biliary system: No gallbladder wall thickening, calcified gallstones or biliary dilatation.  Pancreas: Unremarkable.  Spleen: Unremarkable.  Adrenal glands: No evidence of adrenal mass.  Kidneys/Ureters/Bladder: Several small low-density right renal lesions are not well seen on the prior noncontrast study. These are suboptimally characterized based on size but are probably cysts. There is a tiny low-density lesion in the lower pole of the left kidney. No hydronephrosis, urinary tract calculus or bladder abnormality identified.  Bowel: The stomach, small bowel and appendix appear normal. The proximal colon is opacified with contrast. There are diverticular changes of the sigmoid colon without definite wall thickening. Inflammatory process in the left lower quadrant does not appear to be directly related to the sigmoid colon.  Peritoneum: No ascites or peritoneal nodularity.  Lymph Nodes: Small retroperitoneal and pelvic lymph nodes are likely reactive, not pathologically enlarged.  Vascular Structures: Stable mild atherosclerosis.  Reproductive Organs: The right ovary in uterus demonstrate no significant findings. There is an enlarging complex left adnexal process with a tubular appearing low-density structure, most consistent with a dilated  fallopian tube. This demonstrates mild wall thickening and surrounding soft tissue stranding suspicious for a high pyosalpinx. Overall, this measures up to 4.8 cm transverse and 5.7 cm cephalocaudad. There is no focal extraluminal fluid collection. No signs of torsion were seen on earlier ultrasound.  Abdominal wall:  No evidence of abdominal wall hernia.  Musculoskeletal: No acute osseous findings. There are facet degenerative changes throughout the lumbar spine and asymmetric right hip osteoarthritic changes.  IMPRESSION: 1. As demonstrated on ultrasound performed today, there is progressive enlargement of a low-density left adnexal lesion which appears complex with wall thickening and surrounding soft tissue stranding on CT. This appearance is most consistent with pyosalpinx. 2. Although this process abuts the sigmoid colon, no definite signs of underlying diverticulitis identified. 3. Low-density renal lesions are incompletely characterized based on size, but probably cysts. No hydronephrosis. 4. Mild bibasilar pulmonary atelectasis.  Electronically Signed   By: Roxy Horseman M.D.   On: 02/04/2014 19:34   [place] Art/ven Flow Abd Pelv Doppler  02/04/2014   CLINICAL DATA:  Left lower quadrant pain.  EXAM: TRANSABDOMINAL AND TRANSVAGINAL ULTRASOUND OF PELVIS  DOPPLER ULTRASOUND OF OVARIES  TECHNIQUE: Both transabdominal and transvaginal ultrasound examinations of the pelvis were performed. Transabdominal technique was performed for global imaging of the pelvis including uterus, ovaries, adnexal regions, and pelvic cul-de-sac.  It was necessary to proceed with endovaginal exam following the transabdominal exam to visualize the ovaries. Color and duplex Doppler ultrasound was utilized to evaluate blood flow to the ovaries.  COMPARISON:  Ultrasound of January 30, 2014.  FINDINGS: Uterus  Measurements: 12.6 x 6.4 x 5.6 cm. Fibroid is noted in posterior portion of fundus measuring 2.9 x 2.4 x 2.0 cm.  Endometrium   Thickness: Measures 10 mm which is within normal limits. No focal abnormality visualized.  Right ovary  Right ovary is not visualized on this study, which was the case on prior exam as well.  Left ovary  Measurements: 4.3 x 3.7 x 3.4 cm. 3.9 x 3.4 x 2.9 cm mildly septated cyst is noted in left ovary.  Pulsed Doppler evaluation of left ovary demonstrates normal low-resistance arterial and venous waveforms.  Other findings  No free fluid.  IMPRESSION: Right ovary is not visualized and therefore pathology in this area cannot per ruled out. No right adnexal abnormality is noted. 2.9 cm fibroid is noted in the uterus.  3.9 cm mildly septated cyst is noted in left ovary which is slightly increased in size compared to prior exam. Followup ultrasound in 6-8 weeks is recommended to ensure resolution or stability.   Electronically Signed   By: Roque Lias M.D.   On: 02/04/2014 15:43    Assessment/Plan: Acute PID with pyosalpinx partially treated but failed outpatient management, hospitalized for IV antibiotics and pain management.  ARNOLD,JAMES 02/04/2014, 11:28 PM

## 2014-02-05 ENCOUNTER — Encounter (HOSPITAL_COMMUNITY): Payer: Self-pay | Admitting: *Deleted

## 2014-02-05 DIAGNOSIS — N730 Acute parametritis and pelvic cellulitis: Secondary | ICD-10-CM

## 2014-02-05 DIAGNOSIS — E875 Hyperkalemia: Secondary | ICD-10-CM

## 2014-02-05 DIAGNOSIS — A419 Sepsis, unspecified organism: Secondary | ICD-10-CM

## 2014-02-05 LAB — CBC
HEMATOCRIT: 31.7 % — AB (ref 36.0–46.0)
Hemoglobin: 10.8 g/dL — ABNORMAL LOW (ref 12.0–15.0)
MCH: 30.3 pg (ref 26.0–34.0)
MCHC: 34.1 g/dL (ref 30.0–36.0)
MCV: 89 fL (ref 78.0–100.0)
PLATELETS: 347 10*3/uL (ref 150–400)
RBC: 3.56 MIL/uL — ABNORMAL LOW (ref 3.87–5.11)
RDW: 14.9 % (ref 11.5–15.5)
WBC: 7.9 10*3/uL (ref 4.0–10.5)

## 2014-02-05 LAB — BASIC METABOLIC PANEL
ANION GAP: 12 (ref 5–15)
BUN: 11 mg/dL (ref 6–23)
CALCIUM: 9.2 mg/dL (ref 8.4–10.5)
CO2: 29 meq/L (ref 19–32)
CREATININE: 0.81 mg/dL (ref 0.50–1.10)
Chloride: 100 mEq/L (ref 96–112)
GFR calc Af Amer: >90 mL/min (ref >90)
GFR, EST NON AFRICAN AMERICAN: 85 mL/min — AB (ref >90)
Glucose, Bld: 117 mg/dL — ABNORMAL HIGH (ref 70–99)
Potassium: 3.5 mEq/L — ABNORMAL LOW (ref 3.7–5.3)
Sodium: 141 mEq/L (ref 137–147)

## 2014-02-05 LAB — GC/CHLAMYDIA PROBE AMP
CT Probe RNA: NEGATIVE
GC Probe RNA: NEGATIVE

## 2014-02-05 LAB — URINE CULTURE
Colony Count: 70000
SPECIAL REQUESTS: NORMAL

## 2014-02-05 MED ORDER — HYDROCHLOROTHIAZIDE 12.5 MG PO CAPS
12.5000 mg | ORAL_CAPSULE | Freq: Every day | ORAL | Status: DC
  Administered 2014-02-05 – 2014-02-06 (×2): 12.5 mg via ORAL
  Filled 2014-02-05 (×3): qty 1

## 2014-02-05 MED ORDER — DOXYCYCLINE HYCLATE 100 MG IV SOLR
100.0000 mg | Freq: Two times a day (BID) | INTRAVENOUS | Status: DC
  Administered 2014-02-05 – 2014-02-06 (×3): 100 mg via INTRAVENOUS
  Filled 2014-02-05 (×4): qty 100

## 2014-02-05 NOTE — ED Provider Notes (Signed)
Medical screening examination/treatment/procedure(s) were conducted as a shared visit with non-physician practitioner(s) and myself.  I personally evaluated the patient during the encounter.   EKG Interpretation None     Patient with abdominal pain. Possible UTI on labs, although there is white cells but no bacteria. Has low-grade temperature. Had a cyst on recent ultrasound a negative CT scan. Pain is now more in her left lower quadrant and back. Continued pain will admit. To be seen by internal medicine  Juliet Rude. Rubin Payor, MD 02/05/14 [phone]

## 2014-02-05 NOTE — Plan of Care (Signed)
Problem: Phase I Progression Outcomes Goal: Pain controlled with appropriate interventions Outcome: Completed/Met Date Met:  02/05/14 Pain controlled with IV Dilaudid Goal: OOB as tolerated unless otherwise ordered Outcome: Completed/Met Date Met:  02/05/14 Tolerates walking to bathroom without any difficulty Goal: Initial discharge plan identified Outcome: Completed/Met Date Met:  02/05/14 Pain control Afebrile Infection resolving Goal: Voiding-avoid urinary catheter unless indicated Outcome: Completed/Met Date Met:  02/05/14 Voids without any difficulty

## 2014-02-05 NOTE — Progress Notes (Signed)
UR completed 

## 2014-02-05 NOTE — Progress Notes (Signed)
Subjective: some relief with meds, has diarrhea after eating Patient reports tolerating PO and + BM.    Objective: I have reviewed patient's vital signs, intake and output, medications and labs. Filed Vitals:   02/04/14 2216 02/04/14 2245 02/05/14 0148 02/05/14 0548  BP: 140/68 163/71 146/72 161/74  Pulse:  96 90 93  Temp: 99.9 F (37.7 C) 100 F (37.8 C) 98.6 F (37 C) 99.8 F (37.7 C)  TempSrc: Oral Oral Oral Oral  Resp: 23 22 18 16   Height:      Weight:    188 lb (85.276 kg)  SpO2:  99% 100% 99%     General: alert, cooperative and no distress GI: mild llq tenderness   Assessment/Plan: PID day 1 antibiotics   LOS: 1 day    ARNOLD,JAMES 02/05/2014, 7:07 AM

## 2014-02-06 ENCOUNTER — Telehealth: Payer: Self-pay

## 2014-02-06 DIAGNOSIS — E875 Hyperkalemia: Secondary | ICD-10-CM | POA: Diagnosis not present

## 2014-02-06 DIAGNOSIS — A419 Sepsis, unspecified organism: Secondary | ICD-10-CM | POA: Diagnosis not present

## 2014-02-06 DIAGNOSIS — N730 Acute parametritis and pelvic cellulitis: Secondary | ICD-10-CM | POA: Diagnosis not present

## 2014-02-06 MED ORDER — AMOXICILLIN-POT CLAVULANATE 875-125 MG PO TABS: 1.0000 | ORAL_TABLET | Freq: Two times a day (BID) | ORAL | Status: DC

## 2014-02-06 MED ORDER — METRONIDAZOLE 500 MG PO TABS: 500.0000 mg | ORAL_TABLET | Freq: Two times a day (BID) | ORAL | Status: AC

## 2014-02-06 MED ORDER — HYDROCODONE-ACETAMINOPHEN 5-325 MG PO TABS: 1.0000 | ORAL_TABLET | ORAL | Status: DC | PRN

## 2014-02-06 MED ORDER — IBUPROFEN 600 MG PO TABS: 600.0000 mg | ORAL_TABLET | Freq: Four times a day (QID) | ORAL | Status: DC | PRN

## 2014-02-06 NOTE — Progress Notes (Signed)
Pt discharged home with daughter... Discharge instructions reviewed with pt and she verbalized understanding... Condition stable... No equipment... Ambulated to clinic with [name], NT, so pt could have papers filled out for her job.

## 2014-02-06 NOTE — Discharge Summary (Signed)
Physician Discharge Summary  Patient ID: Kathleen [name] MRN: [id number] DOB/AGE: [birth year] 48 y.o.  Admit date: 02/04/2014 Discharge date: 02/06/2014  Admission Diagnoses: PID  Discharge Diagnoses:  Principal Problem:   Pyelonephritis Active Problems:   Sepsis   Hypokalemia   Left pyosalpinx   PID (acute pelvic inflammatory disease)   Discharged Condition: good  Hospital Course: Pt was transferred from [facility] due to PID.  She was treated with IV atbx for 24 hours with markedly decreased sx.  She had initially presented to the hosp and thought she was on atbx but, the medications she produced was only for pain.  She tolerated po meds and is a candidate for outpt treatment.   She denies fever or chills.  No emesis    Consults: None  Significant Diagnostic Studies: labs: CBC, microbiology: cervical cx.  +trich and radiology: Ultrasound: cyst on left ovary  Treatments: IV hydration and antibiotics: metronidazole and cefotan and doxycycline  Discharge Exam: Blood pressure 141/80, pulse 77, temperature 97.5 F (36.4 C), temperature source Oral, resp. rate 16, height 5\' 4"  (1.626 m), weight 189 lb 8 oz (85.957 kg), last menstrual period 01/30/2014, SpO2 97.00%. General appearance: alert and no distress GI: soft, tender on the left side; no rebound and no guarding Extremities: extremities normal, atraumatic, no cyanosis or edema  02/04/2014 CLINICAL DATA: Left lower quadrant pain.  EXAM:  TRANSABDOMINAL AND TRANSVAGINAL ULTRASOUND OF PELVIS  DOPPLER ULTRASOUND OF OVARIES  TECHNIQUE:  Both transabdominal and transvaginal ultrasound examinations of the  pelvis were performed. Transabdominal technique was performed for  global imaging of the pelvis including uterus, ovaries, adnexal  regions, and pelvic cul-de-sac.  It was necessary to proceed with endovaginal exam following the  transabdominal exam to visualize the ovaries. Color and duplex  Doppler ultrasound was utilized to  evaluate blood flow to the  ovaries.  COMPARISON: Ultrasound of January 30, 2014.  FINDINGS:  Uterus  Measurements: 12.6 x 6.4 x 5.6 cm. Fibroid is noted in posterior  portion of fundus measuring 2.9 x 2.4 x 2.0 cm.  Endometrium  Thickness: Measures 10 mm which is within normal limits. No focal  abnormality visualized.  Right ovary  Right ovary is not visualized on this study, which was the case on  prior exam as well.  Left ovary  Measurements: 4.3 x 3.7 x 3.4 cm. 3.9 x 3.4 x 2.9 cm mildly septated  cyst is noted in left ovary.  Pulsed Doppler evaluation of left ovary demonstrates normal  low-resistance arterial and venous waveforms.  Other findings  No free fluid.  IMPRESSION:  Right ovary is not visualized and therefore pathology in this area  cannot per ruled out. No right adnexal abnormality is noted. 2.9 cm  fibroid is noted in the uterus.  3.9 cm mildly septated cyst is noted in left ovary which is slightly  increased in size compared to prior exam. Followup ultrasound in 6-8  weeks is recommended to ensure resolution or stability.   CBC    Component Value Date/Time   WBC 7.9 02/05/2014 0520   RBC 3.56* 02/05/2014 0520   HGB 10.8* 02/05/2014 0520   HCT 31.7* 02/05/2014 0520   PLT 347 02/05/2014 0520   MCV 89.0 02/05/2014 0520   MCH 30.3 02/05/2014 0520   MCHC 34.1 02/05/2014 0520   RDW 14.9 02/05/2014 0520   LYMPHSABS 1.4 02/04/2014 1150   MONOABS 1.1* 02/04/2014 1150   EOSABS 0.1 02/04/2014 1150   BASOSABS 0.0 02/04/2014 1150  Disposition: 01-Home or Self Care  Discharge Instructions   Call MD for:  difficulty breathing, headache or visual disturbances    Complete by:  As directed      Call MD for:  extreme fatigue    Complete by:  As directed      Call MD for:  hives    Complete by:  As directed      Call MD for:  persistant dizziness or light-headedness    Complete by:  As directed      Call MD for:  persistant nausea and vomiting    Complete by:  As directed       Call MD for:  redness, tenderness, or signs of infection (pain, swelling, redness, odor or green/yellow discharge around incision site)    Complete by:  As directed      Call MD for:  severe uncontrolled pain    Complete by:  As directed      Call MD for:  temperature >100.4    Complete by:  As directed      Diet - low sodium heart healthy    Complete by:  As directed      Increase activity slowly    Complete by:  As directed      Other Restrictions    Complete by:  As directed   May return to work in 2 days            Medication List         amoxicillin-clavulanate 875-125 MG per tablet  Commonly known as:  AUGMENTIN  Take 1 tablet by mouth 2 (two) times daily.     hydrochlorothiazide 12.5 MG tablet  Commonly known as:  HYDRODIURIL  Take 2 tablets (25 mg total) by mouth daily.     HYDROcodone-acetaminophen 5-325 MG per tablet  Commonly known as:  NORCO/VICODIN  Take 1 tablet by mouth every 4 (four) hours as needed for moderate pain or severe pain.     ibuprofen 600 MG tablet  Commonly known as:  ADVIL,MOTRIN  Take 1 tablet (600 mg total) by mouth every 6 (six) hours as needed (mild pain).     metroNIDAZOLE 500 MG tablet  Commonly known as:  FLAGYL  Take 1 tablet (500 mg total) by mouth 2 (two) times daily.     ONE-A-DAY ENERGY PO  Take 1 tablet by mouth daily.           Follow-up Information   Follow up with [facility] In 2 weeks.      Signed: HARRAWAY-SMITH, Loni Abdon 02/06/2014, 9:09 AM

## 2014-02-06 NOTE — Discharge Instructions (Signed)
Pelvic Inflammatory Disease °Pelvic inflammatory disease (PID) refers to an infection in some or all of the female organs. The infection can be in the uterus, ovaries, fallopian tubes, or the surrounding tissues in the pelvis. PID can cause abdominal or pelvic pain that comes on suddenly (acute pelvic pain). PID is a serious infection because it can lead to lasting (chronic) pelvic pain or the inability to have children (infertile).  °CAUSES  °The infection is often caused by the normal bacteria found in the vaginal tissues. PID may also be caused by an infection that is spread during sexual contact. PID can also occur following:  °· The birth of a baby.   °· A miscarriage.   °· An abortion.   °· Major pelvic surgery.   °· The use of an intrauterine device (IUD).   °· A sexual assault.   °RISK FACTORS °Certain factors can put a person at higher risk for PID, such as: °· Being younger than 25 years. °· Being sexually active at a young age. °· Using nonbarrier contraception. °· Having multiple sexual partners. °· Having sex with someone who has symptoms of a genital infection. °· Using oral contraception. °Other times, certain behaviors can increase the possibility of getting PID, such as: °· Having sex during your period. °· Using a vaginal douche. °· Having an intrauterine device (IUD) in place. °SYMPTOMS  °· Abdominal or pelvic pain.   °· Fever.   °· Chills.   °· Abnormal vaginal discharge. °· Abnormal uterine bleeding.   °· Unusual pain shortly after finishing your period. °DIAGNOSIS  °Your caregiver will choose some of the following methods to make a diagnosis, such as:  °· Performing a physical exam and history. A pelvic exam typically reveals a very tender uterus and surrounding pelvis.   °· Ordering laboratory tests including a pregnancy test, blood tests, and urine test.  °· Ordering cultures of the vagina and cervix to check for a sexually transmitted infection (STI). °· Performing an ultrasound.    °· Performing a laparoscopic procedure to look inside the pelvis.   °TREATMENT  °· Antibiotic medicines may be prescribed and taken by mouth.   °· Sexual partners may be treated when the infection is caused by a sexually transmitted disease (STD).   °· Hospitalization may be needed to give antibiotics intravenously. °· Surgery may be needed, but this is rare. °It may take weeks until you are completely well. If you are diagnosed with PID, you should also be checked for human immunodeficiency virus (HIV).   °HOME CARE INSTRUCTIONS  °· If given, take your antibiotics as directed. Finish the medicine even if you start to feel better.   °· Only take over-the-counter or prescription medicines for pain, discomfort, or fever as directed by your caregiver.   °· Do not have sexual intercourse until treatment is completed or as directed by your caregiver. If PID is confirmed, your recent sexual partner(s) will need treatment.   °· Keep your follow-up appointments. °SEEK MEDICAL CARE IF:  °· You have increased or abnormal vaginal discharge.   °· You need prescription medicine for your pain.   °· You vomit.   °· You cannot take your medicines.   °· Your partner has an STD.   °SEEK IMMEDIATE MEDICAL CARE IF:  °· You have a fever.   °· You have increased abdominal or pelvic pain.   °· You have chills.   °· You have pain when you urinate.   °· You are not better after 72 hours following treatment.   °MAKE SURE YOU:  °· Understand these instructions. °· Will watch your condition. °· Will get help right away if you are not doing well or get worse. °  Document Released: 07/06/2005 Document Revised: 10/31/2012 Document Reviewed: 07/02/2011 °[employer]® Patient Information ©2015 [employer]. This information is not intended to replace advice given to you by your health care provider. Make sure you discuss any questions you have with your health care provider. ° °

## 2014-02-06 NOTE — Telephone Encounter (Signed)
Patient called stating she was discharged from hospital today after being treated for PID. States her employer ([employer]) is requesting a letter stating whether or not she can return to full duty or needs to be on light duty. Per discharge summary patient was told she could return to work in 2 days and no specification on any restrictions. Letter given stating she can return to full duty without restrictions. Faxed to [phone] per patient request. Patient to follow up here in clinic in two weeks. Patient informed.

## 2014-02-07 NOTE — ED Provider Notes (Signed)
I saw and evaluated the patient, reviewed the resident's note and I agree with the findings and plan.   EKG Interpretation None      Pt comes in with abd pain. Exam revels lower quadrant tenderness. Pain is sudden onset. CT abd is neg, US shows no torsion. Will d.c - unknown cause for her pain.  Derwood Kaplan, MD 02/07/14 [phone]

## 2014-02-08 ENCOUNTER — Telehealth: Payer: Self-pay

## 2014-02-08 ENCOUNTER — Telehealth: Payer: Self-pay | Admitting: *Deleted

## 2014-02-08 NOTE — Telephone Encounter (Signed)
Contacted patient concerning FMLA papers.  Pt provided fax number for work. No further questions.

## 2014-02-08 NOTE — Telephone Encounter (Signed)
Patient called stating she went to the pharmacy after being discharged to pick up her medication and she got everything except amoxicillin. Pharmacy states it is not there.   [facility] who stated they did not receive Augmentin RX. RX called in. Patient informed.

## 2014-02-09 ENCOUNTER — Telehealth: Payer: Self-pay | Admitting: *Deleted

## 2014-02-09 ENCOUNTER — Encounter: Payer: Self-pay | Admitting: *Deleted

## 2014-02-09 NOTE — Telephone Encounter (Signed)
Called patient and discussed inability for fax of FMLA papers.  The 2 numbers provided are presenting with fax failure.  Informed patient her form are in the front office for pick up or if she can locate correct fax number, we will be glad to re fax.  Pt verbalized understanding.

## 2014-02-10 LAB — CULTURE, BLOOD (ROUTINE X 2)
CULTURE: NO GROWTH
Culture: NO GROWTH

## 2014-02-22 ENCOUNTER — Ambulatory Visit: Payer: BC Managed Care – PPO | Admitting: Obstetrics & Gynecology

## 2014-03-22 ENCOUNTER — Telehealth: Payer: Self-pay

## 2014-03-22 ENCOUNTER — Ambulatory Visit (INDEPENDENT_AMBULATORY_CARE_PROVIDER_SITE_OTHER): Payer: BC Managed Care – PPO | Admitting: Obstetrics & Gynecology

## 2014-03-22 ENCOUNTER — Encounter: Payer: Self-pay | Admitting: Obstetrics & Gynecology

## 2014-03-22 VITALS — BP 190/94 | HR 86 | Temp 98.3°F | Ht 64.0 in | Wt 185.5 lb

## 2014-03-22 DIAGNOSIS — I10 Essential (primary) hypertension: Secondary | ICD-10-CM

## 2014-03-22 DIAGNOSIS — N83209 Unspecified ovarian cyst, unspecified side: Secondary | ICD-10-CM | POA: Diagnosis not present

## 2014-03-22 DIAGNOSIS — N83202 Unspecified ovarian cyst, left side: Secondary | ICD-10-CM

## 2014-03-22 MED ORDER — HYDROCHLOROTHIAZIDE 25 MG PO TABS: 25.0000 mg | ORAL_TABLET | Freq: Every day | ORAL | Status: DC

## 2014-03-22 MED ORDER — HYDROCHLOROTHIAZIDE 12.5 MG PO TABS
25.0000 mg | ORAL_TABLET | Freq: Every day | ORAL | Status: DC
Start: 2014-03-22 — End: 2014-03-22

## 2014-03-22 NOTE — Progress Notes (Signed)
Subjective:     Patient ID: Kathleen [name], female   DOB: May 01, 1966, 48 y.o.   MRN: [id number]  HPI Pt presents for hosp f/u.  She was treated for TOA vs ovarian cyst. She reports that her sx have improved. She is off pain meds.  She is back at work but, still c/o pain with intercourse that started with the onset of this current problem.  Pt took 2 weeks of the BP med and ran out.  Did not make her 2 week post hosp visit.     Review of Systems     Objective:   Physical Exam BP 193/100  Pulse 88  Temp(Src) 98.3 F (36.8 C)  Ht 5\' 4"  (1.626 m)  Wt 185 lb 8 oz (84.142 kg)  BMI 31.83 kg/m2  LMP 03/15/2014 Pt in NAD Abd: soft, NT; ND    03/17/2014 Pelvis Complete  02/04/2014 CLINICAL DATA: Left lower quadrant pain. EXAM: TRANSABDOMINAL AND TRANSVAGINAL ULTRASOUND OF PELVIS DOPPLER ULTRASOUND OF OVARIES TECHNIQUE: Both transabdominal and transvaginal ultrasound examinations of the pelvis were performed. Transabdominal technique was performed for global imaging of the pelvis including uterus, ovaries, adnexal regions, and pelvic cul-de-sac. It was necessary to proceed with endovaginal exam following the transabdominal exam to visualize the ovaries. Color and duplex Doppler ultrasound was utilized to evaluate blood flow to the ovaries. COMPARISON: Ultrasound of January 30, 2014. FINDINGS: Uterus Measurements: 12.6 x 6.4 x 5.6 cm. Fibroid is noted in posterior portion of fundus measuring 2.9 x 2.4 x 2.0 cm. Endometrium Thickness: Measures 10 mm which is within normal limits. No focal abnormality visualized. Right ovary Right ovary is not visualized on this study, which was the case on prior exam as well. Left ovary Measurements: 4.3 x 3.7 x 3.4 cm. 3.9 x 3.4 x 2.9 cm mildly septated cyst is noted in left ovary. Pulsed Doppler evaluation of left ovary demonstrates normal low-resistance arterial and venous waveforms. Other findings No free fluid. IMPRESSION: Right ovary is not visualized and therefore pathology in  this area cannot per ruled out. No right adnexal abnormality is noted. 2.9 cm fibroid is noted in the uterus. 3.9 cm mildly septated cyst is noted in left ovary which is slightly increased in size compared to prior exam. Followup ultrasound in 6-8 weeks is recommended to ensure resolution or stability. Electronically Signed By: February 01, 2014 M.D. On: 02/04/2014 15:43  Ct Abdomen Pelvis W Contrast  02/04/2014 CLINICAL DATA: Left abdominal pain with diaphoresis and vomiting for 2 weeks. EXAM: CT ABDOMEN AND PELVIS WITH CONTRAST TECHNIQUE: Multidetector CT imaging of the abdomen and pelvis was performed using the standard protocol following bolus administration of intravenous contrast. CONTRAST: 02/06/2014 OMNIPAQUE IOHEXOL 300 MG/ML SOLN COMPARISON: Pelvic ultrasound today. Abdominal pelvic CT 01/30/2014. FINDINGS: Lung bases: New mild subsegmental atelectasis at both lung bases. There is a stable calcified right lower lobe granuloma. No consolidation, pleural or pericardial effusion present. Liver: Normal in density without focal abnormality. Gallbladder/Biliary system: No gallbladder wall thickening, calcified gallstones or biliary dilatation. Pancreas: Unremarkable. Spleen: Unremarkable. Adrenal glands: No evidence of adrenal mass. Kidneys/Ureters/Bladder: Several small low-density right renal lesions are not well seen on the prior noncontrast study. These are suboptimally characterized based on size but are probably cysts. There is a tiny low-density lesion in the lower pole of the left kidney. No hydronephrosis, urinary tract calculus or bladder abnormality identified. Bowel: The stomach, small bowel and appendix appear normal. The proximal colon is opacified with contrast. There are diverticular changes of the sigmoid  colon without definite wall thickening. Inflammatory process in the left lower quadrant does not appear to be directly related to the sigmoid colon. Peritoneum: No ascites or peritoneal nodularity. Lymph  Nodes: Small retroperitoneal and pelvic lymph nodes are likely reactive, not pathologically enlarged. Vascular Structures: Stable mild atherosclerosis. Reproductive Organs: The right ovary in uterus demonstrate no significant findings. There is an enlarging complex left adnexal process with a tubular appearing low-density structure, most consistent with a dilated fallopian tube. This demonstrates mild wall thickening and surrounding soft tissue stranding suspicious for a high pyosalpinx. Overall, this measures up to 4.8 cm transverse and 5.7 cm cephalocaudad. There is no focal extraluminal fluid collection. No signs of torsion were seen on earlier ultrasound. Abdominal wall: No evidence of abdominal wall hernia. Musculoskeletal: No acute osseous findings. There are facet degenerative changes throughout the lumbar spine and asymmetric right hip osteoarthritic changes. IMPRESSION: 1. As demonstrated on ultrasound performed today, there is progressive enlargement of a low-density left adnexal lesion which appears complex with wall thickening and surrounding soft tissue stranding on CT. This appearance is most consistent with pyosalpinx. 2. Although this process abuts the sigmoid colon, no definite signs of underlying diverticulitis identified. 3. Low-density renal lesions are incompletely characterized based on size, but probably cysts. No hydronephrosis. 4. Mild bibasilar pulmonary atelectasis. Electronically Signed By: Roxy Horseman M.D. On: 02/04/2014 19:34  [place] Art/ven Flow Abd Pelv Doppler  02/04/2014 CLINICAL DATA: Left lower quadrant pain. EXAM: TRANSABDOMINAL AND TRANSVAGINAL ULTRASOUND OF PELVIS DOPPLER ULTRASOUND OF OVARIES TECHNIQUE: Both transabdominal and transvaginal ultrasound examinations of the pelvis were performed. Transabdominal technique was performed for global imaging of the pelvis including uterus, ovaries, adnexal regions, and pelvic cul-de-sac. It was necessary to proceed with endovaginal exam  following the transabdominal exam to visualize the ovaries. Color and duplex Doppler ultrasound was utilized to evaluate blood flow to the ovaries. COMPARISON: Ultrasound of January 30, 2014. FINDINGS: Uterus Measurements: 12.6 x 6.4 x 5.6 cm. Fibroid is noted in posterior portion of fundus measuring 2.9 x 2.4 x 2.0 cm. Endometrium Thickness: Measures 10 mm which is within normal limits. No focal abnormality visualized. Right ovary Right ovary is not visualized on this study, which was the case on prior exam as well. Left ovary Measurements: 4.3 x 3.7 x 3.4 cm. 3.9 x 3.4 x 2.9 cm mildly septated cyst is noted in left ovary. Pulsed Doppler evaluation of left ovary demonstrates normal low-resistance arterial and venous waveforms. Other findings No free fluid. IMPRESSION: Right ovary is not visualized and therefore pathology in this area cannot per ruled out. No right adnexal abnormality is noted. 2.9 cm fibroid is noted in the uterus. 3.9 cm mildly septated cyst is noted in left ovary which is slightly increased in size compared to prior exam. Followup ultrasound in 6-8 weeks is recommended to ensure resolution or stability. Electronically Signed By: Roque Lias M.D. On: 02/04/2014 15:43   Assessment:     F/u PID/ovarian cyst  Elevated BP    Plan:     Need f/u sono exam HCTZ 25 mg po q day Reviewed diet and exercise rec TOBACCO CESSATION Pt to call in 2 weeks with BP from home Pt to check BP from home. If BP over 140/90 after the 2 weeks call pt will call for additional meds F/u in 3 months or sooner prn

## 2014-03-22 NOTE — Progress Notes (Signed)
Pt has only taken 14 day supply of blood pressure medication given by [facility] ED.

## 2014-03-22 NOTE — Patient Instructions (Signed)
Cardiac Diet This diet can help prevent heart disease and stroke. Many factors influence your heart health, including eating and exercise habits. Coronary risk rises a lot with abnormal blood fat (lipid) levels. Cardiac meal planning includes limiting unhealthy fats, increasing healthy fats, and making other small dietary changes. General guidelines are as follows:  Adjust calorie intake to reach and maintain desirable body weight.  Limit total fat intake to less than 30% of total calories. Saturated fat should be less than 7% of calories.  Saturated fats are found in animal products and in some vegetable products. Saturated vegetable fats are found in coconut oil, cocoa butter, palm oil, and palm kernel oil. Read labels carefully to avoid these products as much as possible. Use butter in moderation. Choose tub margarines and oils that have 2 grams of fat or less. Good cooking oils are canola and olive oils.  Practice low-fat cooking techniques. Do not fry food. Instead, broil, bake, boil, steam, grill, roast on a rack, stir-fry, or microwave it. Other fat reducing suggestions include:  Remove the skin from poultry.  Remove all visible fat from meats.  Skim the fat off stews, soups, and gravies before serving them.  Steam vegetables in water or broth instead of sauting them in fat.  Avoid foods with trans fat (or hydrogenated oils), such as commercially fried foods and commercially baked goods. Commercial shortening and deep-frying fats will contain trans fat.  Increase intake of fruits, vegetables, whole grains, and legumes to replace foods high in fat.  Increase consumption of nuts, legumes, and seeds to at least 4 servings weekly. One serving of a legume equals  cup, and 1 serving of nuts or seeds equals  cup.  Choose whole grains more often. Have 3 servings per day (a serving is 1 ounce [oz]).  Eat 4 to 5 servings of vegetables per day. A serving of vegetables is 1 cup of raw leafy  vegetables;  cup of raw or cooked cut-up vegetables;  cup of vegetable juice.  Eat 4 to 5 servings of fruit per day. A serving of fruit is 1 medium whole fruit;  cup of dried fruit;  cup of fresh, frozen, or canned fruit;  cup of 100% fruit juice.  Increase your intake of dietary fiber to 20 to 30 grams per day. Insoluble fiber may help lower your risk of heart disease and may help curb your appetite.  Soluble fiber binds cholesterol to be removed from the blood. Foods high in soluble fiber are dried beans, citrus fruits, oats, apples, bananas, broccoli, Brussels sprouts, and eggplant.  Try to include foods fortified with plant sterols or stanols, such as yogurt, breads, juices, or margarines. Choose several fortified foods to achieve a daily intake of 2 to 3 grams of plant sterols or stanols.  Foods with omega-3 fats can help reduce your risk of heart disease. Aim to have a 3.5 oz portion of fatty fish twice per week, such as salmon, mackerel, albacore tuna, sardines, lake trout, or herring. If you wish to take a fish oil supplement, choose one that contains 1 gram of both DHA and EPA.  Limit processed meats to 2 servings (3 oz portion) weekly.  Limit the sodium in your diet to 1500 milligrams (mg) per day. If you have high blood pressure, talk to a registered dietitian about a DASH (Dietary Approaches to Stop Hypertension) eating plan.  Limit sweets and beverages with added sugar, such as soda, to no more than 5 servings per week. One  serving is:   1 tablespoon sugar.  1 tablespoon jelly or jam.   cup sorbet.  1 cup lemonade.   cup regular soda. CHOOSING FOODS Starches  Allowed: Breads: All kinds (wheat, rye, raisin, white, oatmeal, [place], [place], and English muffin bread). Low-fat rolls: English muffins, frankfurter and hamburger buns, bagels, pita bread, tortillas (not fried). Pancakes, waffles, biscuits, and muffins made with recommended oil.  Avoid: Products made with  saturated or trans fats, oils, or whole milk products. Butter rolls, cheese breads, croissants. Commercial doughnuts, muffins, sweet rolls, biscuits, waffles, pancakes, store-bought mixes. Crackers  Allowed: Low-fat crackers and snacks: Animal, graham, rye, saltine (with recommended oil, no lard), oyster, and matzo crackers. Bread sticks, melba toast, rusks, flatbread, pretzels, and light popcorn.  Avoid: High-fat crackers: cheese crackers, butter crackers, and those made with coconut, palm oil, or trans fat (hydrogenated oils). Buttered popcorn. Cereals  Allowed: Hot or cold whole-grain cereals.  Avoid: Cereals containing coconut, hydrogenated vegetable fat, or animal fat. Potatoes / Pasta / Rice  Allowed: All kinds of potatoes, rice, and pasta (such as macaroni, spaghetti, and noodles).  Avoid: Pasta or rice prepared with cream sauce or high-fat cheese. Chow mein noodles, [place] fries. Vegetables  Allowed: All vegetables and vegetable juices.  Avoid: Fried vegetables. Vegetables in cream, butter, or high-fat cheese sauces. Limit coconut. Fruit in cream or custard. Protein  Allowed: Limit your intake of meat, seafood, and poultry to no more than 6 oz (cooked weight) per day. All lean, well-trimmed beef, veal, pork, and lamb. All chicken and [place] without skin. All fish and shellfish. Wild game: wild duck, rabbit, pheasant, and venison. Egg whites or low-cholesterol egg substitutes may be used as desired. Meatless dishes: recipes with dried beans, peas, lentils, and tofu (soybean curd). Seeds and nuts: all seeds and most nuts.  Avoid: Prime grade and other heavily marbled and fatty meats, such as short ribs, spare ribs, rib eye roast or steak, frankfurters, sausage, bacon, and high-fat luncheon meats, mutton. Caviar. Commercially fried fish. Domestic duck, goose, venison sausage. Organ meats: liver, gizzard, heart, chitterlings, brains, kidney, sweetbreads. Dairy  Allowed: Low-fat  cheeses: nonfat or low-fat cottage cheese (1% or 2% fat), cheeses made with part skim milk, such as mozzarella, farmers, string, or ricotta. (Cheeses should be labeled no more than 2 to 6 grams fat per oz.). Skim (or 1%) milk: liquid, powdered, or evaporated. Buttermilk made with low-fat milk. Drinks made with skim or low-fat milk or cocoa. Chocolate milk or cocoa made with skim or low-fat (1%) milk. Nonfat or low-fat yogurt.  Avoid: Whole milk cheeses, including colby, cheddar, [place], [address], [place], [place], [place], [address], [place], and blue. Creamed cottage cheese, cream cheese. Whole milk and whole milk products, including buttermilk or yogurt made from whole milk, drinks made from whole milk. Condensed milk, evaporated whole milk, and 2% milk. Soups and Combination Foods  Allowed: Low-fat low-sodium soups: broth, dehydrated soups, homemade broth, soups with the fat removed, homemade cream soups made with skim or low-fat milk. Low-fat spaghetti, lasagna, chili, and Spanish rice if low-fat ingredients and low-fat cooking techniques are used.  Avoid: Cream soups made with whole milk, cream, or high-fat cheese. All other soups. Desserts and Sweets  Allowed: Sherbet, fruit ices, gelatins, meringues, and angel food cake. Homemade desserts with recommended fats, oils, and milk products. Jam, jelly, honey, marmalade, sugars, and syrups. Pure sugar candy, such as gum drops, hard candy, jelly beans, marshmallows, mints, and small amounts of dark chocolate.  Avoid: Commercially prepared  cakes, pies, cookies, frosting, pudding, or mixes for these products. Desserts containing whole milk products, chocolate, coconut, lard, palm oil, or palm kernel oil. Ice cream or ice cream drinks. Candy that contains chocolate, coconut, butter, hydrogenated fat, or unknown ingredients. Buttered syrups. Fats and Oils  Allowed: Vegetable oils: safflower, sunflower, corn, soybean, cottonseed, sesame, canola, olive,  or peanut. Non-hydrogenated margarines. Salad dressing or mayonnaise: homemade or commercial, made with a recommended oil. Low or nonfat salad dressing or mayonnaise.  Limit added fats and oils to 6 to 8 tsp per day (includes fats used in cooking, baking, salads, and spreads on bread). Remember to count the "hidden fats" in foods.  Avoid: Solid fats and shortenings: butter, lard, salt pork, bacon drippings. Gravy containing meat fat, shortening, or suet. Cocoa butter, coconut. Coconut oil, palm oil, palm kernel oil, or hydrogenated oils: these ingredients are often used in bakery products, nondairy creamers, whipped toppings, candy, and commercially fried foods. Read labels carefully. Salad dressings made of unknown oils, sour cream, or cheese, such as blue cheese and Roquefort. Cream, all kinds: half-and-half, light, heavy, or whipping. Sour cream or cream cheese (even if "light" or low-fat). Nondairy cream substitutes: coffee creamers and sour cream substitutes made with palm, palm kernel, hydrogenated oils, or coconut oil. Beverages  Allowed: Coffee (regular or decaffeinated), tea. Diet carbonated beverages, mineral water. Alcohol: Check with your caregiver. Moderation is recommended.  Avoid: Whole milk, regular sodas, and juice drinks with added sugar. Condiments  Allowed: All seasonings and condiments. Cocoa powder. "Cream" sauces made with recommended ingredients.  Avoid: Carob powder made with hydrogenated fats. SAMPLE MENU Breakfast   cup orange juice   cup oatmeal  1 slice toast  1 tsp margarine  1 cup skim milk Lunch  [place] sandwich with 2 oz [place], 2 slices bread  Lettuce and tomato slices  Fresh fruit  Carrot sticks  Coffee or tea Snack  Fresh fruit or low-fat crackers Dinner  3 oz lean ground beef  1 baked potato  1 tsp margarine   cup asparagus  Lettuce salad  1 tbs non-creamy dressing   cup peach slices  1 cup skim milk Document Released:  04/14/2008 Document Revised: 01/05/2012 Document Reviewed: 09/05/2013 [employer] Patient Information 2015 [place], [place]. This information is not intended to replace advice given to you by your health care provider. Make sure you discuss any questions you have with your health care provider. Cooking with Less Salt Cooking with less salt is one way to reduce the amount of sodium you get from food. Sodium raises blood pressure and causes water to be held in the body. Getting less sodium from food may help lower your blood pressure, reduce any swelling, and protect your heart, liver, and kidneys.  WHAT DO I NEED TO KNOW ABOUT COOKING WITH LESS SALT?  Buy sodium-free or low-sodium products. Look on the label for the words:   Lower-sodium.  Sodium-free.   Sodium-reduced.   No salt added.   Unsalted.  Check the food label before using or buying packaged ingredients.  Look for products with no more than 150 mg of sodium in one serving.  Do not choose foods with salt as one of the first three ingredients on the ingredients list. If salt is one of the first three ingredients, it usually means the item is high in sodium because ingredients are listed in order of amount in the food item.  Use herbs, seasonings without salt, and spices as substitutes for salt in foods.  Use sodium-free baking soda when baking. WHAT ARE SOME SALT ALTERNATIVES? The following are herbs, seasonings, and spices that can be used instead of salt to give taste to your food. Next to their names are foods they can be used to flavor.  Herbs  Bay-Soups, meat and vegetable dishes, and spaghetti sauce.   Basil-Italian dishes, soups, pasta, and fish dishes.   Cilantro-Meat, poultry, and vegetable dishes.   Chili Powder-Marinades and Mexican dishes.   Chives-Salad dressings and potato dishes.   Cumin-Mexican dishes, couscous, and meat dishes.   Dill-Fish dishes, sauces, and salads.   Fennel-Meat and  vegetable dishes, breads, and cookies.   Garlic (do not use garlic salt)-Italian dishes, meat dishes, salad dressings, and sauces.   Marjoram-Soups, potato dishes, and meat dishes.   Oregano-Pizza and spaghetti sauce.   Parsley-Salads, soups, pasta, and meat dishes.   Rosemary-Italian dishes, salad dressings, soups, and red meats.   Saffron-Fish dishes, pasta, and some poultry dishes.   Sage-Stuffings and sauces.   Tarragon-Fish and Whole Foods.   Thyme-Stuffing, meat, and fish dishes.  Herbs should be fresh or dried. Do not choose packaged mixes.  Seasonings  Lemon juice-Fish dishes, poultry dishes, vegetables, and salads.   Vinegar-Salad dressings, vegetables, and fish dishes.  Spices  Cinnamon-Sweet dishes (such as cakes, cookies, and puddings).   Cloves-Gingerbread, puddings, and marinades for meats.   Curry-Vegetable dishes, fish and poultry dishes, and stir-fry dishes.   Ginger-Vegetables dishes, fish dishes, and stir-fry dishes.   Nutmeg-Pasta, vegetables, poultry, fish dishes, and custard.  WHAT ARE SOME LOW-SODIUM INGREDIENTS AND FOODS?  Fresh or frozen fruits and vegetables with no sauce added.  Fresh or frozen whole meats, poultry, and fish with no sauce added.  Eggs.  Noodles, pasta, quinoa, rice.  Shredded or puffed wheat or puffed rice.  Regular or quick oats.  Milk, yogurt, low-sodium cheeses and hard cheeses (such as cheddar, [employer], or mozzarella). Always check the label for serving size and sodium content.  Unsalted butter or margarine.  Unsalted nuts.  Sherbet or ice cream (keep to  cup serving).  Homemade pudding.  Sodium-free baking soda and baking powder. This is not a complete list of low-sodium ingredients and foods. Contact your dietitian for more options.  WHAT HIGH-SODIUM INGREDIENTS ARE NOT RECOMMENDED?  Sauces, such as mustard, barbecue sauce, soy sauce, teriyaki sauce, steak sauce, chili sauce,  cocktail sauce, and tartar sauce.  Mixes, such as flavored rice.  Instant products, such as ready-made pasta.  Horseradish.  Salsa.   Packaged gravies.   Rosita [place].  Olives.  Sauerkraut.  Salted nuts.   Cured or smoked meats (such as hot dogs, bacon, salami, ham, and bologna).   Processed vegetable juices, such as tomato juice.  Buttermilk.  Processed cheeses (such as cheese dips or cheese spread).  Cottage cheese.  Instant hot cereals.  Dessert mixes (ready-to-make) and store-bought cakes and pies.  Crackers with salted tops. This is not a complete list of high-sodium ingredients. Contact your dietitian for more options.  Document Released: 07/06/2005 Document Revised: 07/11/2013 Document Reviewed: 05/29/2013 [facility] Patient Information 2015 [place], [place]. This information is not intended to replace advice given to you by your health care provider. Make sure you discuss any questions you have with your health care provider.

## 2014-03-22 NOTE — Telephone Encounter (Signed)
Opened in error

## 2014-03-27 ENCOUNTER — Encounter: Payer: Self-pay | Admitting: General Practice

## 2014-03-30 ENCOUNTER — Ambulatory Visit (HOSPITAL_COMMUNITY)
Admission: RE | Admit: 2014-03-30 | Discharge: 2014-03-30 | Disposition: A | Discharge status: Home / Self Care | Payer: BC Managed Care – PPO | Source: Ambulatory Visit | Attending: Obstetrics & Gynecology | Admitting: Obstetrics & Gynecology

## 2014-03-30 DIAGNOSIS — N920 Excessive and frequent menstruation with regular cycle: Secondary | ICD-10-CM | POA: Diagnosis not present

## 2014-03-30 DIAGNOSIS — N83209 Unspecified ovarian cyst, unspecified side: Secondary | ICD-10-CM | POA: Diagnosis not present

## 2014-03-30 DIAGNOSIS — N854 Malposition of uterus: Secondary | ICD-10-CM | POA: Diagnosis not present

## 2014-03-30 DIAGNOSIS — N83202 Unspecified ovarian cyst, left side: Secondary | ICD-10-CM

## 2014-04-03 ENCOUNTER — Telehealth: Payer: Self-pay | Admitting: *Deleted

## 2014-04-03 NOTE — Telephone Encounter (Addendum)
Pt brought log of BP's taken at home since last clinic visit on 9/3. I was given the record of BP's to review and when I called pt from the lobby in order to discuss the results, she had left. I was told by another staff member that she had to go to work. I called pt and obtained additional information as to the time of day she is checking her BP and also what time she takes her HCTZ. She stated that she takes her first BP reading @ 0650 when she gets up. Then she takes her HCTZ @ 0700. Her second BP reading is at 2330 when she gets home from work. She mentioned that she has scheduled appt to begin care with a PCP - [facility]. Her first appt will be on 9/25. She also would like to know the results of her [place] from 9/11. I advised Mahasin that I will have Dr. Erin Fulling review the BP record and [place] result when she is here in the clinic and then call her back with the response.  Pt voiced understanding.  1545  Consulted w/Dr. Erin Fulling and she reviewed record of BP readings.  She stated that pt does not require additional medication at this time and should bring a copy of this record to her appt on 9/25 with her PCP. She also reviewed [place] results and advised that pt be informed that there is little change from her last scan. No change in plan of care at this time.  Pt should notify our office of any changes in her condition related to GYN problems.

## 2014-04-04 NOTE — Telephone Encounter (Signed)
Patient came by office and I informed her of Dr Danne Harbor recommendations. Patient verbalized understanding to all and had no questions

## 2014-04-13 ENCOUNTER — Ambulatory Visit (INDEPENDENT_AMBULATORY_CARE_PROVIDER_SITE_OTHER): Payer: BC Managed Care – PPO | Admitting: Family Medicine

## 2014-04-13 ENCOUNTER — Encounter: Payer: Self-pay | Admitting: Family Medicine

## 2014-04-13 VITALS — BP 182/100 | HR 76 | Temp 98.0°F | Resp 14 | Ht 63.0 in | Wt 186.0 lb

## 2014-04-13 DIAGNOSIS — F17200 Nicotine dependence, unspecified, uncomplicated: Secondary | ICD-10-CM

## 2014-04-13 DIAGNOSIS — M25561 Pain in right knee: Secondary | ICD-10-CM | POA: Insufficient documentation

## 2014-04-13 DIAGNOSIS — M25569 Pain in unspecified knee: Secondary | ICD-10-CM

## 2014-04-13 DIAGNOSIS — I10 Essential (primary) hypertension: Secondary | ICD-10-CM | POA: Insufficient documentation

## 2014-04-13 DIAGNOSIS — Z23 Encounter for immunization: Secondary | ICD-10-CM

## 2014-04-13 LAB — LIPID PANEL
CHOL/HDL RATIO: 4 ratio
CHOLESTEROL: 194 mg/dL (ref 0–200)
HDL: 49 mg/dL (ref >39)
LDL Cholesterol: 128 mg/dL — ABNORMAL HIGH (ref 0–99)
Triglycerides: 84 mg/dL (ref <150)
VLDL: 17 mg/dL (ref 0–40)

## 2014-04-13 LAB — COMPREHENSIVE METABOLIC PANEL
ALT: 14 U/L (ref 0–35)
AST: 15 U/L (ref 0–37)
Albumin: 4.1 g/dL (ref 3.5–5.2)
Alkaline Phosphatase: 77 U/L (ref 39–117)
BUN: 13 mg/dL (ref 6–23)
CALCIUM: 10.2 mg/dL (ref 8.4–10.5)
CHLORIDE: 105 meq/L (ref 96–112)
CO2: 27 meq/L (ref 19–32)
CREATININE: 0.72 mg/dL (ref 0.50–1.10)
Glucose, Bld: 98 mg/dL (ref 70–99)
Potassium: 4.1 mEq/L (ref 3.5–5.3)
Sodium: 140 mEq/L (ref 135–145)
Total Bilirubin: 0.3 mg/dL (ref 0.2–1.2)
Total Protein: 7.3 g/dL (ref 6.0–8.3)

## 2014-04-13 LAB — CBC WITH DIFFERENTIAL/PLATELET
Basophils Absolute: 0 10*3/uL (ref 0.0–0.1)
Basophils Relative: 1 % (ref 0–1)
EOS PCT: 3 % (ref 0–5)
Eosinophils Absolute: 0.1 10*3/uL (ref 0.0–0.7)
HEMATOCRIT: 37 % (ref 36.0–46.0)
Hemoglobin: 12.6 g/dL (ref 12.0–15.0)
LYMPHS ABS: 2.2 10*3/uL (ref 0.7–4.0)
LYMPHS PCT: 49 % — AB (ref 12–46)
MCH: 29.3 pg (ref 26.0–34.0)
MCHC: 34.1 g/dL (ref 30.0–36.0)
MCV: 86 fL (ref 78.0–100.0)
Monocytes Absolute: 0.4 10*3/uL (ref 0.1–1.0)
Monocytes Relative: 8 % (ref 3–12)
Neutro Abs: 1.8 10*3/uL (ref 1.7–7.7)
Neutrophils Relative %: 39 % — ABNORMAL LOW (ref 43–77)
Platelets: 377 10*3/uL (ref 150–400)
RBC: 4.3 MIL/uL (ref 3.87–5.11)
RDW: 14.6 % (ref 11.5–15.5)
WBC: 4.5 10*3/uL (ref 4.0–10.5)

## 2014-04-13 LAB — TSH: TSH: 0.858 u[IU]/mL (ref 0.350–4.500)

## 2014-04-13 MED ORDER — NAPROXEN 500 MG PO TABS: 500.0000 mg | ORAL_TABLET | Freq: Two times a day (BID) | ORAL | Status: DC

## 2014-04-13 MED ORDER — LISINOPRIL 20 MG PO TABS: 20.0000 mg | ORAL_TABLET | Freq: Every day | ORAL | Status: DC

## 2014-04-13 NOTE — Assessment & Plan Note (Signed)
Counselor tobacco cessation. Flu shot given

## 2014-04-13 NOTE — Assessment & Plan Note (Signed)
Blood pressure is uncontrolled will add lisinopril 20 mg her hydrochlorothiazide she will return in 2 weeks here will obtain fasting labs today.

## 2014-04-13 NOTE — Progress Notes (Signed)
Patient ID: Kathleen [name], female   DOB: Sep 07, 1965, 48 y.o.   MRN: [id number]   Subjective:    Patient ID: Kathleen [name], female    DOB: Jul 13, 1966, 48 y.o.   MRN: [id number]  Patient presents for New Patient CPE and Hospital F/U  patient here to establish care. She's not had a PCP in a few years. She is followed by GYN and she was admitted recently secondary to pyelonephritis as well as acute pelvic inflammatory disease and a left ovarian cyst. She is due to followup with him in 3 months for GYN examination. She does have history of hypertension and mild hyperlipidemia. She's not been on any medications until recently. For GYN put her on hydrochlorothiazide 25 mg on September 3 she is to monitor her blood pressure at home before her medication her blood pressures range from 140-179/80-112 after her blood pressures range from 130 to 148/70 to 90s  She also complains of right knee pain on and off for the past few months. 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 She's noticed some swelling of the knee. She occasionally takes Tylenol which helps somewhat the pain. Occasionally her knee doesn't buckle and she also hears a crunching sound no specific injury to the knee   Review Of Systems:  GEN- denies fatigue, fever, weight loss,weakness, recent illness HEENT- denies eye drainage, change in vision, nasal discharge, CVS- denies chest pain, palpitations RESP- denies SOB, cough, wheeze ABD- denies N/V, change in stools, abd pain GU- denies dysuria, hematuria, dribbling, incontinence MSK- + joint pain, muscle aches, injury Neuro- denies headache, dizziness, syncope, seizure activity       Objective:    BP 182/100  Pulse 76  Temp(Src) 98 F (36.7 C) (Oral)  Resp 14  Ht 5\' 3"  (1.6 m)  Wt 186 lb (84.369 kg)  BMI 32.96 kg/m2  LMP 03/17/2014 GEN- NAD, alert and oriented x3  Repeat 180/100 HEENT- PERRL, EOMI, non injected sclera, pink conjunctiva, MMM, oropharynx  clear Neck- Supple, no thyromegaly CVS- RRR, no murmur RESP-CTAB ABD-NABS,soft,NT,ND EXT- No edema MSK- Right knee- mild effusion noted, fair ROM, +crepitus, ligaments in tact, left knee- normal inspection FROM Psych- normal affect and mood Pulses- Radial, DP- 2+        Assessment & Plan:      Problem List Items Addressed This Visit   Essential hypertension, benign - Primary   Relevant Medications      lisinopril (PRINIVIL,ZESTRIL) tablet   Other Relevant Orders      CBC with Differential (Completed)      Comprehensive metabolic panel (Completed)      Lipid panel (Completed)      TSH (Completed)    Other Visit Diagnoses   Right knee pain        Relevant Orders       DG Knee Complete 4 Views Right       Note: This dictation was prepared with Dragon dictation along with smaller phrase technology. Any transcriptional errors that result from this process are unintentional.

## 2014-04-13 NOTE — Patient Instructions (Addendum)
Continue the HCTZ once a day Start lisinopril 1 tablet once a day  We will call with lab results Flu shot  Knee xray- [address] Take naprosyn with food F/U 2 weeks

## 2014-04-13 NOTE — Assessment & Plan Note (Signed)
Right knee pain concern for her some degenerative changes especially with a small amount of fluid that I see her knee today. I will get x-rays of the knee and start her on Naprosyn 500 mg twice a day

## 2014-04-16 NOTE — Addendum Note (Signed)
Addended by: Phillips Odor on: 04/16/2014 08:05 AM   Modules accepted: Orders

## 2014-04-25 ENCOUNTER — Ambulatory Visit
Admission: RE | Admit: 2014-04-25 | Discharge: 2014-04-25 | Disposition: A | Discharge status: Home / Self Care | Payer: BC Managed Care – PPO | Source: Ambulatory Visit | Attending: Family Medicine | Admitting: Family Medicine

## 2014-04-25 DIAGNOSIS — M25561 Pain in right knee: Secondary | ICD-10-CM

## 2014-04-27 ENCOUNTER — Ambulatory Visit: Payer: BC Managed Care – PPO | Admitting: Family Medicine

## 2014-05-02 ENCOUNTER — Encounter: Payer: Self-pay | Admitting: Family Medicine

## 2014-05-02 ENCOUNTER — Ambulatory Visit (INDEPENDENT_AMBULATORY_CARE_PROVIDER_SITE_OTHER): Payer: BC Managed Care – PPO | Admitting: Family Medicine

## 2014-05-02 VITALS — BP 136/72 | HR 78 | Temp 98.3°F | Resp 14 | Ht 63.0 in | Wt 184.0 lb

## 2014-05-02 DIAGNOSIS — I10 Essential (primary) hypertension: Secondary | ICD-10-CM

## 2014-05-02 DIAGNOSIS — M179 Osteoarthritis of knee, unspecified: Secondary | ICD-10-CM | POA: Insufficient documentation

## 2014-05-02 DIAGNOSIS — M1711 Unilateral primary osteoarthritis, right knee: Secondary | ICD-10-CM

## 2014-05-02 DIAGNOSIS — M1710 Unilateral primary osteoarthritis, unspecified knee: Secondary | ICD-10-CM | POA: Insufficient documentation

## 2014-05-02 MED ORDER — HYDROCHLOROTHIAZIDE 25 MG PO TABS: 25.0000 mg | ORAL_TABLET | Freq: Every day | ORAL | Status: DC

## 2014-05-02 MED ORDER — NAPROXEN 500 MG PO TABS: 500.0000 mg | ORAL_TABLET | Freq: Two times a day (BID) | ORAL | Status: DC

## 2014-05-02 MED ORDER — LISINOPRIL 20 MG PO TABS: 20.0000 mg | ORAL_TABLET | Freq: Every day | ORAL | Status: DC

## 2014-05-02 NOTE — Assessment & Plan Note (Signed)
Blood pressure is much improved will continue current medication

## 2014-05-02 NOTE — Assessment & Plan Note (Signed)
She declines steroid injection will have her use Naprosyn twice a day as well as ice and elevation of the leg

## 2014-05-02 NOTE — Progress Notes (Signed)
Patient ID: Kathleen [name], female   DOB: 1966-03-18, 48 y.o.   MRN: [id number]   Subjective:    Patient ID: Kathleen [name], female    DOB: 23-Jul-1965, 48 y.o.   MRN: [id number]  Patient presents for 2 week F/U  patient here for interim followup on hypertension. She's taken hydrochlorothiazide and lisinopril without any difficulties no side effects. Regarding her right knee pain she had x-ray done which showed mild osteoarthritis in the medial lateral compartments as well as a small effusion. She declines steroid injection orthopedics at this time. She's using a knee brace she's also taking Naprosyn at bedtime which helps Her blood pressures at home have ranged from 105-130/80-90 for the past 7 days. 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 and her blood pressure elevated she had some palpitations, it resolved after getting some air   Review Of Systems:  GEN- denies fatigue, fever, weight loss,weakness, recent illness HEENT- denies eye drainage, change in vision, nasal discharge, CVS- denies chest pain, palpitations RESP- denies SOB, cough, wheeze MSK- +oint pain, muscle aches, injury Neuro- denies headache, dizziness, syncope, seizure activity       Objective:    BP 136/72  Pulse 78  Temp(Src) 98.3 F (36.8 C) (Oral)  Resp 14  Ht 5\' 3"  (1.6 m)  Wt 184 lb (83.462 kg)  BMI 32.60 kg/m2  LMP 03/17/2014 GEN- NAD, alert and oriented x3 HEENT- PERRL, EOMI, non injected sclera, pink conjunctiva, MMM, oropharynx clear CVS- RRR, no murmur RESP-CTAB EXT- No edema Pulses- Radial, DP- 2+        Assessment & Plan:      Problem List Items Addressed This Visit   OA (osteoarthritis) of knee   Relevant Medications      naproxen (NAPROSYN) tablet   Essential hypertension, benign   Relevant Medications      hydrochlorothiazide tablet      lisinopril (PRINIVIL,ZESTRIL) tablet    Other Visit Diagnoses   Essential  hypertension    -  Primary    Relevant Medications       hydrochlorothiazide tablet       lisinopril (PRINIVIL,ZESTRIL) tablet       Note: This dictation was prepared with Dragon dictation along with smaller 03/19/2014. Any transcriptional errors that result from this process are unintentional.

## 2014-05-02 NOTE — Patient Instructions (Signed)
Continue blood pressure medication Arthritis of knee  F/U 6 months

## 2014-05-21 ENCOUNTER — Encounter: Payer: Self-pay | Admitting: Family Medicine

## 2014-09-04 ENCOUNTER — Encounter: Payer: Self-pay | Admitting: Family Medicine

## 2014-09-20 ENCOUNTER — Encounter (HOSPITAL_COMMUNITY): Payer: Self-pay

## 2014-10-03 ENCOUNTER — Encounter: Payer: Self-pay | Admitting: Family Medicine

## 2015-01-13 IMAGING — CR DG KNEE COMPLETE 4+V*R*
4 series · 4 of 4 positions shown · non-contrast
Comparison: None.

CLINICAL DATA: Chronic right knee pain medially and laterally

EXAM:
RIGHT KNEE - COMPLETE 4+ VIEW

[t knee ap right]
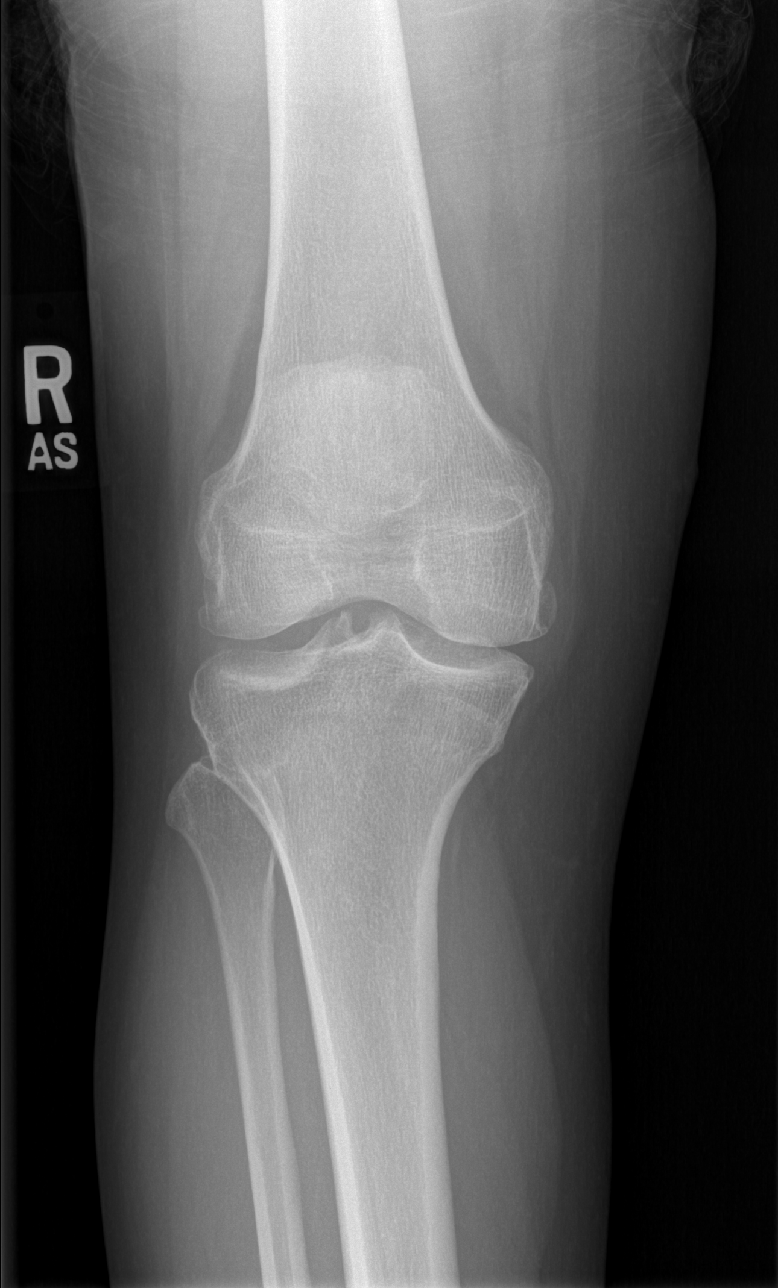

[t knee oblique right (1 of 2)]
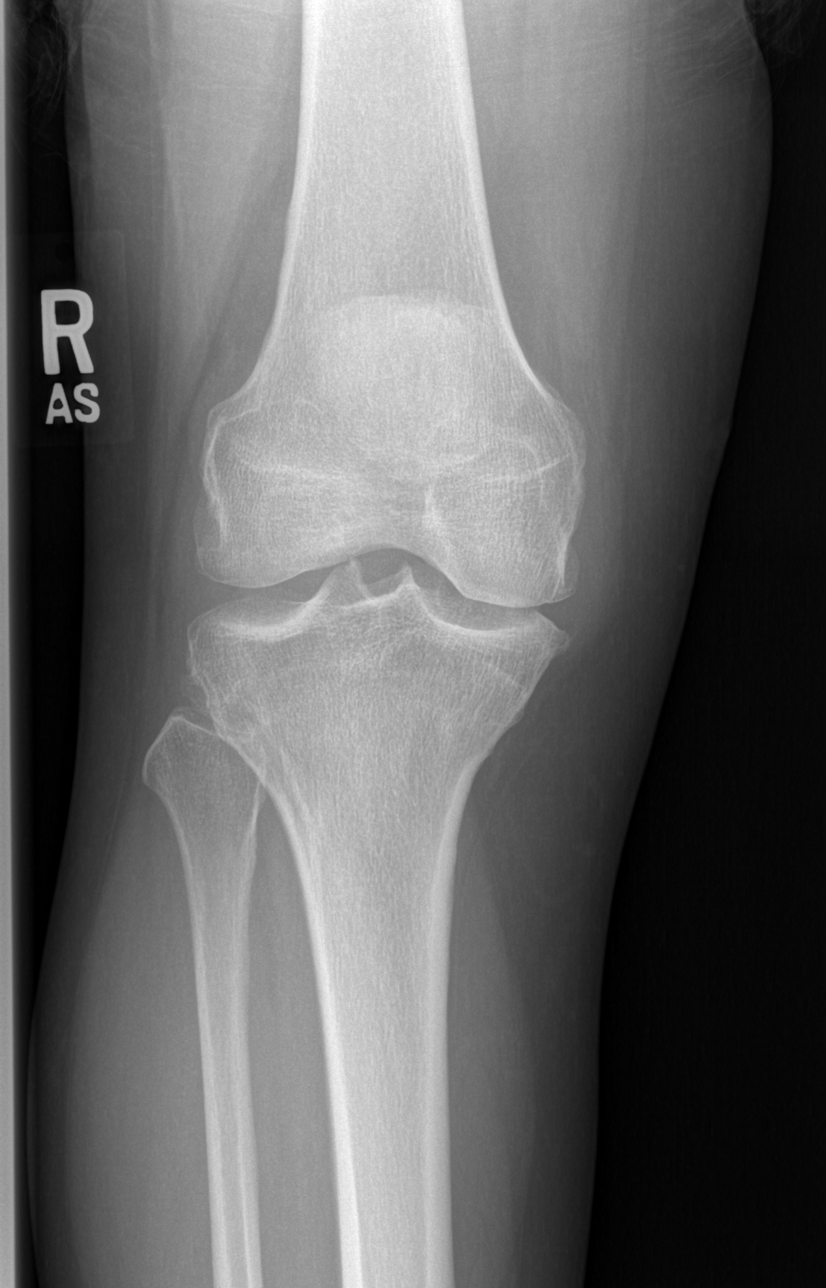

[t knee oblique right (2 of 2)]
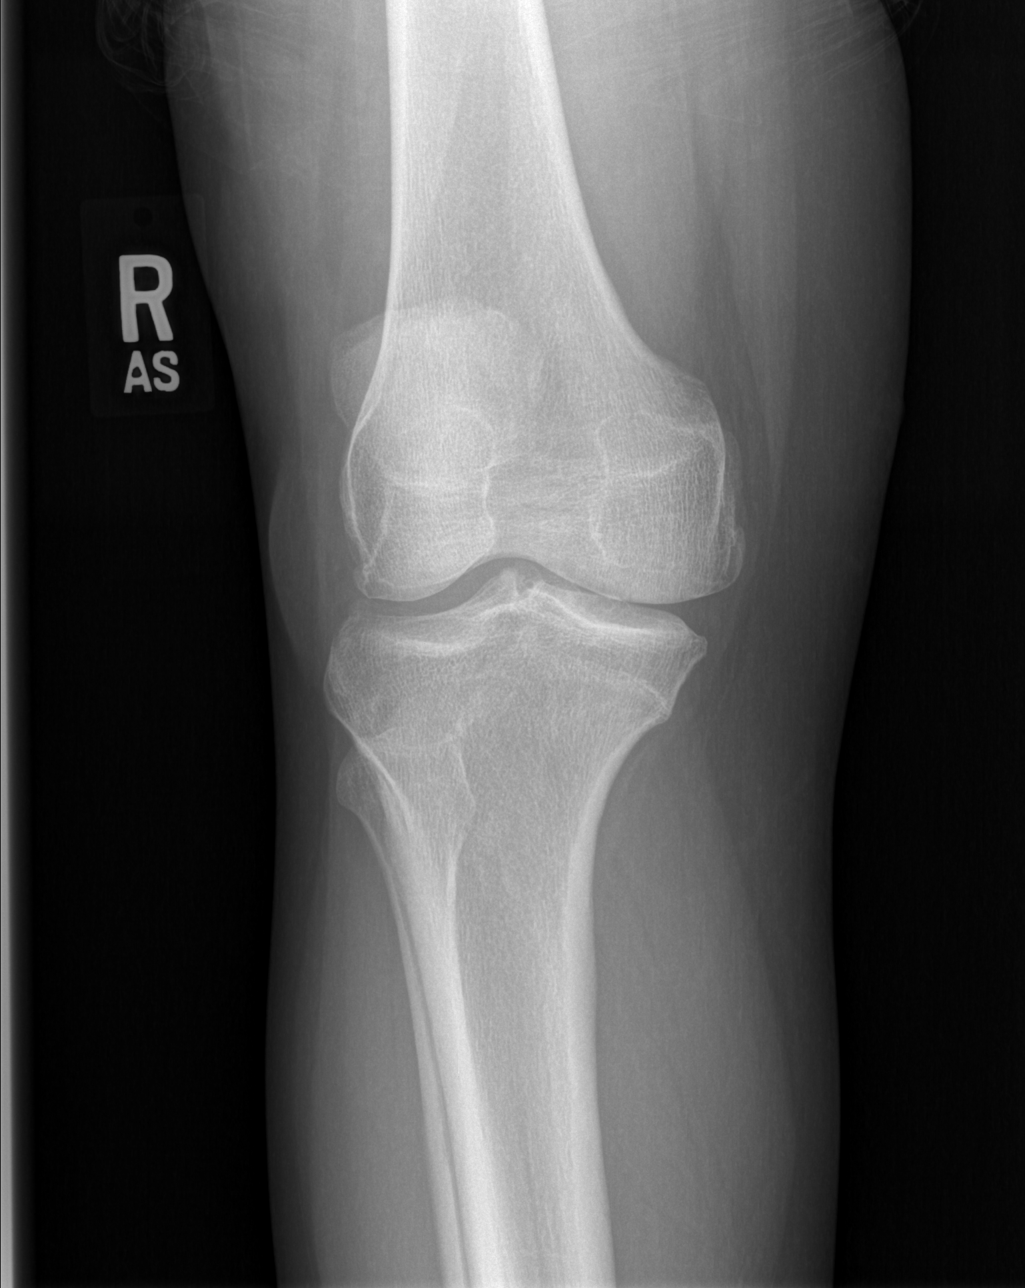

[t knee lat right]
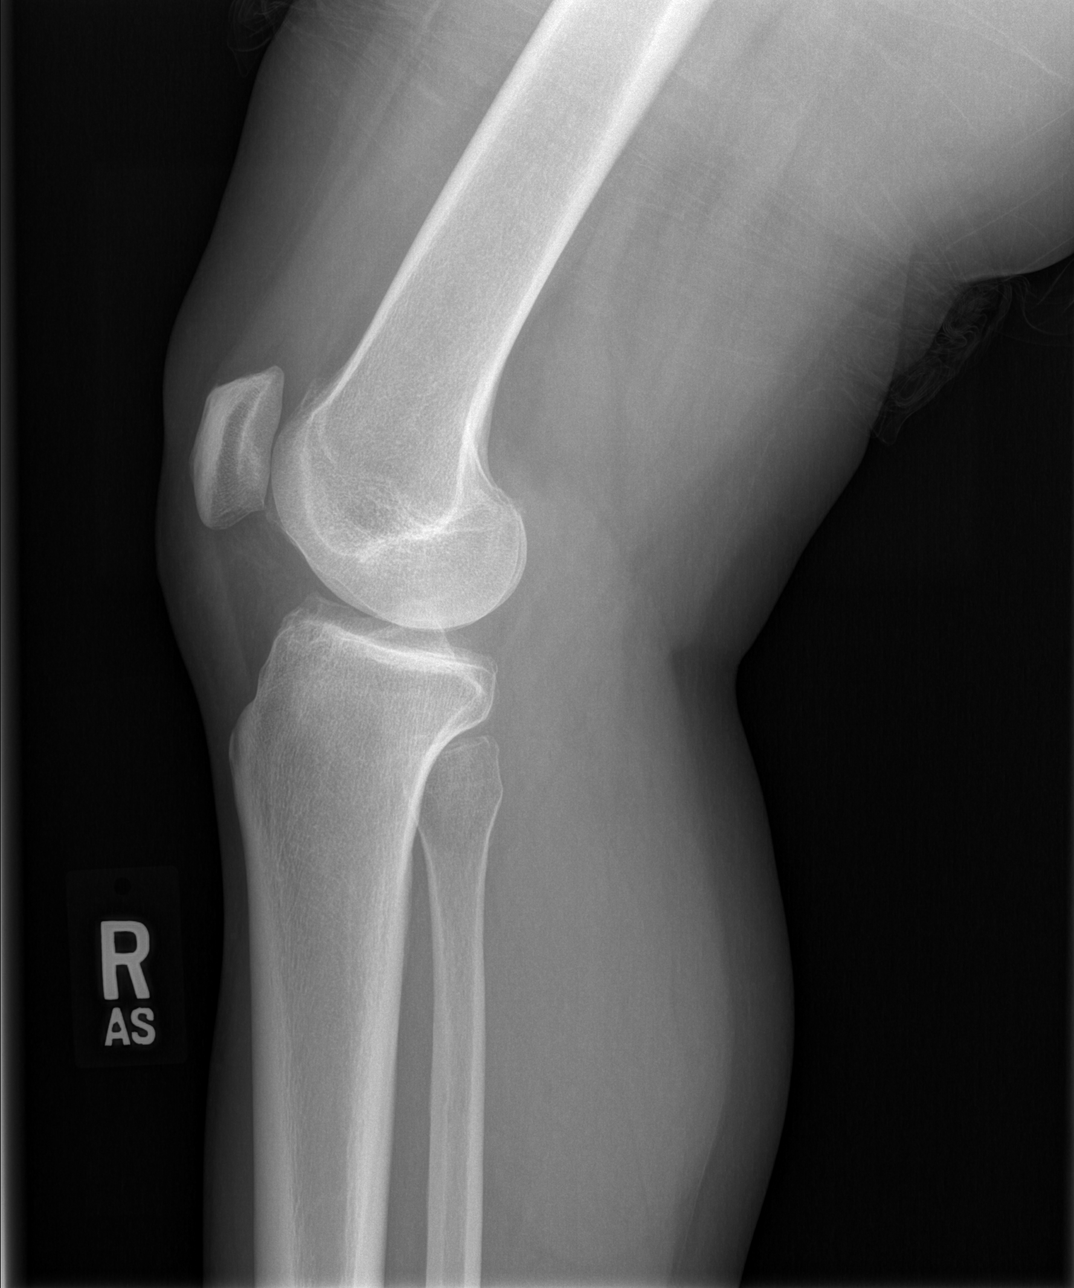

[4 of 4 positions shown; findings below may reference images not displayed]

FINDINGS: There is no evidence of fracture or dislocation. There is mild
osteoarthritis of the medial and lateral femorotibial compartments.
There is a small joint effusion. Soft tissues are unremarkable.
IMPRESSION: Mild medial and lateral femorotibial compartment osteoarthritis.

## 2015-03-14 ENCOUNTER — Other Ambulatory Visit: Payer: Self-pay | Admitting: Family Medicine

## 2015-03-14 NOTE — Telephone Encounter (Signed)
Medication filled x1 with no refills.   Requires office visit before any further refills can be given.   Letter sent.  

## 2015-09-10 ENCOUNTER — Other Ambulatory Visit: Payer: Self-pay | Admitting: Family Medicine

## 2015-09-10 ENCOUNTER — Encounter: Payer: Self-pay | Admitting: Family Medicine

## 2015-09-10 NOTE — Telephone Encounter (Signed)
HCTZ refill denied, pt has not been seen since 2015.  Letter to patient, must be seen.

## 2015-09-10 NOTE — Telephone Encounter (Signed)
Refill Lisinopril denied, pt has not been seen since 2015

## 2015-09-17 ENCOUNTER — Other Ambulatory Visit: Payer: Self-pay | Admitting: *Deleted

## 2015-09-17 DIAGNOSIS — I10 Essential (primary) hypertension: Secondary | ICD-10-CM

## 2015-09-17 MED ORDER — HYDROCHLOROTHIAZIDE 25 MG PO TABS: 25.0000 mg | ORAL_TABLET | Freq: Every day | ORAL | Status: DC

## 2015-09-17 MED ORDER — LISINOPRIL 20 MG PO TABS: 20.0000 mg | ORAL_TABLET | Freq: Every day | ORAL | Status: DC

## 2015-09-17 NOTE — Telephone Encounter (Signed)
Patient seen in office with step son.   Requested refill on HTN mediations.   Medication filled x1 with no refills.   Requires office visit before any further refills can be given.

## 2016-09-03 ENCOUNTER — Emergency Department (HOSPITAL_COMMUNITY)
Admission: EM | Admit: 2016-09-03 | Discharge: 2016-09-03 | Disposition: A | Discharge status: Home / Self Care | Payer: Self-pay | Attending: Dermatology | Admitting: Dermatology

## 2016-09-03 ENCOUNTER — Encounter (HOSPITAL_COMMUNITY): Payer: Self-pay | Admitting: Emergency Medicine

## 2016-09-03 DIAGNOSIS — I10 Essential (primary) hypertension: Secondary | ICD-10-CM | POA: Insufficient documentation

## 2016-09-03 DIAGNOSIS — F17210 Nicotine dependence, cigarettes, uncomplicated: Secondary | ICD-10-CM | POA: Insufficient documentation

## 2016-09-03 DIAGNOSIS — Z79899 Other long term (current) drug therapy: Secondary | ICD-10-CM | POA: Insufficient documentation

## 2016-09-03 DIAGNOSIS — M545 Low back pain: Secondary | ICD-10-CM | POA: Insufficient documentation

## 2016-09-03 DIAGNOSIS — M25562 Pain in left knee: Secondary | ICD-10-CM | POA: Insufficient documentation

## 2016-09-03 DIAGNOSIS — Z5321 Procedure and treatment not carried out due to patient leaving prior to being seen by health care provider: Secondary | ICD-10-CM | POA: Insufficient documentation

## 2016-09-03 NOTE — ED Triage Notes (Signed)
Pt endorses lower back pain and left knee pain since Monday. Pt denies injury. Pt is hypertensive in triage and pt has hx of same but has not been taking her BP meds "in a while"

## 2016-09-03 NOTE — ED Notes (Signed)
Pt endorses lower back pain and left knee pain/swelling since Monday. Pt denies injury. Pt is hypertensive in triage and pt has hx of same but has not been taking her BP meds "in a while"

## 2018-02-24 ENCOUNTER — Other Ambulatory Visit: Payer: Self-pay

## 2018-02-24 ENCOUNTER — Encounter (HOSPITAL_COMMUNITY): Payer: Self-pay | Admitting: Emergency Medicine

## 2018-02-24 ENCOUNTER — Emergency Department (HOSPITAL_COMMUNITY)
Admission: EM | Admit: 2018-02-24 | Discharge: 2018-02-25 | Disposition: A | Discharge status: Home / Self Care | Payer: Self-pay | Attending: Emergency Medicine | Admitting: Emergency Medicine

## 2018-02-24 DIAGNOSIS — T7840XA Allergy, unspecified, initial encounter: Secondary | ICD-10-CM

## 2018-02-24 DIAGNOSIS — I10 Essential (primary) hypertension: Secondary | ICD-10-CM | POA: Insufficient documentation

## 2018-02-24 DIAGNOSIS — F17210 Nicotine dependence, cigarettes, uncomplicated: Secondary | ICD-10-CM | POA: Insufficient documentation

## 2018-02-24 DIAGNOSIS — T63444A Toxic effect of venom of bees, undetermined, initial encounter: Secondary | ICD-10-CM

## 2018-02-24 DIAGNOSIS — Z79899 Other long term (current) drug therapy: Secondary | ICD-10-CM | POA: Insufficient documentation

## 2018-02-24 DIAGNOSIS — T63441A Toxic effect of venom of bees, accidental (unintentional), initial encounter: Secondary | ICD-10-CM | POA: Insufficient documentation

## 2018-02-24 MED ORDER — DIPHENHYDRAMINE HCL 50 MG/ML IJ SOLN
25.0000 mg | Freq: Once | INTRAMUSCULAR | Status: AC
  Administered 2018-02-24: 25 mg via INTRAVENOUS
  Filled 2018-02-24: qty 1

## 2018-02-24 MED ORDER — KETOROLAC TROMETHAMINE 15 MG/ML IJ SOLN
15.0000 mg | Freq: Once | INTRAMUSCULAR | Status: AC
  Administered 2018-02-24: 15 mg via INTRAVENOUS
  Filled 2018-02-24: qty 1

## 2018-02-24 MED ORDER — METHYLPREDNISOLONE SODIUM SUCC 125 MG IJ SOLR
125.0000 mg | Freq: Once | INTRAMUSCULAR | Status: AC
  Administered 2018-02-24: 125 mg via INTRAVENOUS
  Filled 2018-02-24: qty 2

## 2018-02-24 MED ORDER — PREDNISONE 20 MG PO TABS: 40.0000 mg | ORAL_TABLET | Freq: Every day | ORAL | 0 refills | Status: DC

## 2018-02-24 MED ORDER — EPINEPHRINE 0.3 MG/0.3ML IJ SOAJ: 0.3000 mg | Freq: Once | INTRAMUSCULAR | 0 refills | Status: DC

## 2018-02-24 MED ORDER — EPINEPHRINE 0.3 MG/0.3ML IJ SOAJ: 0.3000 mg | Freq: Once | INTRAMUSCULAR | 0 refills | Status: AC

## 2018-02-24 NOTE — Discharge Instructions (Addendum)
Take Prednisone for the next 5 days  Take Benadryl as needed for itching Use epi-pen for anaphylaxis (bee sting plus wheezing, vomiting, throat swelling) Use cool compresses for areas of pain

## 2018-02-24 NOTE — ED Triage Notes (Signed)
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 Was mowing the lawn and was stung multiple times to right face. Swelling to right eye, jaw, and behind ear.  Pt denies any mouth or throat swelling. No current difficulty swallowing.

## 2018-02-24 NOTE — ED Provider Notes (Signed)
[facility] EMERGENCY DEPARTMENT Provider Note   CSN: [id number] Arrival date & time: 02/24/18  2104     History   Chief Complaint Chief Complaint  Patient presents with  . Allergic Reaction  . Insect Bite    HPI Kathleen [name] is a 52 y.o. female who presents with allergic reaction and insect sting.  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  Patient states that she was mowing the lawn earlier this evening and thinks she ran over a nest.  She was stung several times on her face over the right forehead and in the right posterior scalp.  This occurred around 8:15 PM this evening.  She started to develop pain, swelling, redness of the area.  She states she has a headache as well.  The pain is a burning pain and her eye was swollen shut.  She became concerned because she had anaphylaxis before.  She states that she has not had any throat swelling, hives, shortness of breath, wheezing, abdominal cramping, nausea or vomiting this evening.  HPI  Past Medical History:  Diagnosis Date  . Hypertension     Patient Active Problem List   Diagnosis Date Noted  . OA (osteoarthritis) of knee 05/02/2014  . Essential hypertension, benign 04/13/2014  . Right knee pain 04/13/2014  . Tobacco use disorder 04/13/2014  . Left pyosalpinx 02/04/2014    Past Surgical History:  Procedure Laterality Date  . CESAREAN SECTION     3 cesarean sections     OB History    Gravida  6   Para  3   Term  3   Preterm  0   AB  3   Living  3     SAB  3   TAB  0   Ectopic  0   Multiple  0   Live Births               Home Medications    Prior to Admission medications   Medication Sig Start Date End Date Taking? Authorizing Provider  hydrochlorothiazide (HYDRODIURIL) 25 MG tablet Take 1 tablet (25 mg total) by mouth daily. 09/17/15   Salley Scarlet, MD  ibuprofen (ADVIL,MOTRIN) 600 MG tablet Take 1 tablet (600 mg total) by mouth every  6 (six) hours as needed (mild pain). 02/06/14   Willodean Rosenthal, MD  lisinopril (PRINIVIL,ZESTRIL) 20 MG tablet Take 1 tablet (20 mg total) by mouth daily. 09/17/15   Salley Scarlet, MD  Multiple Vitamins-Minerals (ONE-A-DAY ENERGY PO) Take 1 tablet by mouth daily.    [provider]  naproxen (NAPROSYN) 500 MG tablet Take 1 tablet (500 mg total) by mouth 2 (two) times daily with a meal. 05/02/14   Butler, Velna Hatchet, MD    Family History Family History  Problem Relation Age of Onset  . Cancer Mother        Female  . Hypertension Mother     Social History Social History   Tobacco Use  . Smoking status: Current Every Day Smoker    Packs/day: 0.50    Types: Cigarettes  . Smokeless tobacco: Never Used  Substance Use Topics  . Alcohol use: No  . Drug use: No     Allergies   Bee venom   Review of Systems Review of Systems  HENT: Positive for facial swelling. Negative for trouble swallowing.   Respiratory: Negative for shortness of breath and wheezing.   Gastrointestinal: Negative for abdominal pain, nausea  and vomiting.  Skin: Positive for rash.  Neurological: Negative for light-headedness.  All other systems reviewed and are negative.    Physical Exam Updated Vital Signs BP (!) 171/107   Pulse 100   Temp 97.7 F (36.5 C)   Resp 20   Ht 5\' 4"  (1.626 m)   Wt 79.4 kg   LMP 01/02/2016   SpO2 97%   BMI 30.04 kg/m   Physical Exam  Constitutional: She is oriented to person, place, and time. She appears well-developed and well-nourished. No distress.  HENT:  Head: Normocephalic and atraumatic.  Eyes: Pupils are equal, round, and reactive to light. Conjunctivae are normal. Right eye exhibits no discharge. Left eye exhibits no discharge. No scleral icterus.  R periorbital swelling  Neck: Normal range of motion.  Cardiovascular: Normal rate and regular rhythm.  Pulmonary/Chest: Effort normal and breath sounds normal. No respiratory distress.    Abdominal: Soft. Bowel sounds are normal. She exhibits no distension. There is no tenderness.  Neurological: She is alert and oriented to person, place, and time.  Skin: Skin is warm and dry. Rash (Erythema and swelling over the right side of the face) noted.  Psychiatric: She has a normal mood and affect. Her behavior is normal.  Nursing note and vitals reviewed.    ED Treatments / Results  Labs (all labs ordered are listed, but only abnormal results are displayed) Labs Reviewed - No data to display  EKG None  Radiology No results found.  Procedures Procedures (including critical care time)  Medications Ordered in ED Medications  diphenhydrAMINE (BENADRYL) injection 25 mg (25 mg Intravenous Given 02/24/18 2149)  methylPREDNISolone sodium succinate (SOLU-MEDROL) 125 mg/2 mL injection 125 mg (125 mg Intravenous Given 02/24/18 2149)  ketorolac (TORADOL) 15 MG/ML injection 15 mg (15 mg Intravenous Given 02/24/18 2337)     Initial Impression / Assessment and Plan / ED Course  I have reviewed the triage vital signs and the nursing notes.  Pertinent labs & imaging results that were available during my care of the patient were reviewed by me and considered in my medical decision making (see chart for details).  52 year old female with history of anaphylaxis presents with insect bite and acute facial swelling and rash.  She is hypertensive and borderline tachycardic but otherwise vital signs are normal.  She does not have any throat swelling, wheezing, vomiting on exam.  Symptoms are consistent with local reaction.  She was given IV Benadryl and Solu-Medrol and was observed for an hour and a half.  She had visible reduction in swelling of her face.  She tolerated p.o.  She was given prescription for prednisone and was advised to take Benadryl as needed and to apply cool compresses to the area.  She was also given a prescription for an EpiPen for home.  Final Clinical Impressions(s) / ED  Diagnoses   Final diagnoses:  Allergic reaction, initial encounter  Bee sting, undetermined intent, initial encounter    ED Discharge Orders    None       44, PA-C 02/25/18 0018    04/27/18, MD 02/26/18 [phone]

## 2020-01-06 ENCOUNTER — Other Ambulatory Visit: Payer: Self-pay

## 2020-01-06 ENCOUNTER — Ambulatory Visit (HOSPITAL_COMMUNITY)
Admission: EM | Admit: 2020-01-06 | Discharge: 2020-01-06 | Disposition: A | Discharge status: Home / Self Care | Payer: PRIVATE HEALTH INSURANCE | Attending: Family Medicine | Admitting: Family Medicine

## 2020-01-06 ENCOUNTER — Encounter (HOSPITAL_COMMUNITY): Payer: Self-pay

## 2020-01-06 DIAGNOSIS — M5416 Radiculopathy, lumbar region: Secondary | ICD-10-CM

## 2020-01-06 MED ORDER — PREDNISONE 5 MG PO TABS: ORAL_TABLET | ORAL | 0 refills | Status: DC

## 2020-01-06 NOTE — ED Provider Notes (Signed)
[place]    CSN: [id number] Arrival date & time: 01/06/20  1134      History   Chief Complaint Chief Complaint  Patient presents with  . Knee Pain    HPI Kathleen [name] is a 54 y.o. female.   She is presenting with right knee pain.  The pain is laterally as well as radiating down to the ankle.  She denies any inciting event or trauma.  She has a history of knee arthritis.  This is more painful and she has pain with ambulation.  Denies any redness.  No knee swelling.  No improvement with all modalities.  HPI  Past Medical History:  Diagnosis Date  . Hypertension     Patient Active Problem List   Diagnosis Date Noted  . OA (osteoarthritis) of knee 05/02/2014  . Essential hypertension, benign 04/13/2014  . Right knee pain 04/13/2014  . Tobacco use disorder 04/13/2014  . Left pyosalpinx 02/04/2014    Past Surgical History:  Procedure Laterality Date  . CESAREAN SECTION     3 cesarean sections    OB History    Gravida  6   Para  3   Term  3   Preterm  0   AB  3   Living  3     SAB  3   TAB  0   Ectopic  0   Multiple  0   Live Births               Home Medications    Prior to Admission medications   Medication Sig Start Date End Date Taking? Authorizing Provider  hydrochlorothiazide (HYDRODIURIL) 25 MG tablet Take 1 tablet (25 mg total) by mouth daily. 09/17/15   Alycia Rossetti, MD  ibuprofen (ADVIL,MOTRIN) 600 MG tablet Take 1 tablet (600 mg total) by mouth every 6 (six) hours as needed (mild pain). 02/06/14   Lavonia Drafts, MD  lisinopril (PRINIVIL,ZESTRIL) 20 MG tablet Take 1 tablet (20 mg total) by mouth daily. 09/17/15   Alycia Rossetti, MD  Multiple Vitamins-Minerals (ONE-A-DAY ENERGY PO) Take 1 tablet by mouth daily.    [provider]  naproxen (NAPROSYN) 500 MG tablet Take 1 tablet (500 mg total) by mouth 2 (two) times daily with a meal. 05/02/14   Macomb, Modena Nunnery, MD  predniSONE (DELTASONE) 5 MG  tablet Take 6 pills for first day, 5 pills second day, 4 pills third day, 3 pills fourth day, 2 pills the fifth day, and 1 pill sixth day. 01/06/20   Rosemarie Ax, MD    Family History Family History  Problem Relation Age of Onset  . Cancer Mother        Female  . Hypertension Mother     Social History Social History   Tobacco Use  . Smoking status: Current Every Day Smoker    Packs/day: 0.50    Types: Cigarettes  . Smokeless tobacco: Never Used  Substance Use Topics  . Alcohol use: No  . Drug use: No     Allergies   Bee venom   Review of Systems Review of Systems  See HPI  Physical Exam Triage Vital Signs ED Triage Vitals [01/06/20 1158]  Enc Vitals Group     BP (!) 153/88     Pulse Rate 92     Resp 20     Temp 98.8 F (37.1 C)     Temp Source Oral     SpO2 98 %  Weight      Height      Head Circumference      Peak Flow      Pain Score 10     Pain Loc      Pain Edu?      Excl. in GC?    No data found.  Updated Vital Signs BP (!) 153/88 (BP Location: Left Arm)   Pulse 92   Temp 98.8 F (37.1 C) (Oral)   Resp 20   LMP 01/02/2016   SpO2 98%   Visual Acuity Right Eye Distance:   Left Eye Distance:   Bilateral Distance:    Right Eye Near:   Left Eye Near:    Bilateral Near:     Physical Exam Gen: NAD, alert, cooperative with exam, well-appearing ENT: normal lips, normal nasal mucosa,  Eye: normal EOM, normal conjunctiva and lids Neuro: normal tone, normal sensation to touch Psych:  normal insight, alert and oriented MSK:  Right knee:  No effusion  Normal passive ROM  No redness  No instability  Positive noble's test  TTP along the lateral joint space  NVI      UC Treatments / Results  Labs (all labs ordered are listed, but only abnormal results are displayed) Labs Reviewed - No data to display  EKG   Radiology No results found.  Procedures Procedures (including critical care time)  Medications Ordered in  UC Medications - No data to display  Initial Impression / Assessment and Plan / UC Course  I have reviewed the triage vital signs and the nursing notes.  Pertinent labs & imaging results that were available during my care of the patient were reviewed by me and considered in my medical decision making (see chart for details).     Kathleen [name] is a 54 year old female is presenting with right lateral knee and leg pain.  The pain seems more radicular nature as opposed to being associated with the knee.  No inciting event or trauma.  No knee effusion today.  Provide prednisone.  Counseled on supportive care.  Given indications to follow-up return.  Final Clinical Impressions(s) / UC Diagnoses   Final diagnoses:  Lumbar radiculopathy     Discharge Instructions     Please try the medicine  Please try ice  Please follow up if the symptoms fail to improve.     ED Prescriptions    Medication Sig Dispense Auth. Provider   predniSONE (DELTASONE) 5 MG tablet Take 6 pills for first day, 5 pills second day, 4 pills third day, 3 pills fourth day, 2 pills the fifth day, and 1 pill sixth day. 21 tablet Myra Rude, MD     PDMP not reviewed this encounter.   Myra Rude, MD 01/06/20 1320

## 2020-01-06 NOTE — ED Triage Notes (Signed)
Pt reports sharp pain form right knee to ankle, since this morning when she woke up. Pt states she can not put weight on. Pt denies any injury, fall, trauma.

## 2020-01-06 NOTE — Discharge Instructions (Signed)
Please try the medicine  Please try ice  Please follow up if the symptoms fail to improve.

## 2021-03-28 ENCOUNTER — Other Ambulatory Visit: Payer: Self-pay

## 2021-03-28 ENCOUNTER — Encounter (HOSPITAL_COMMUNITY): Payer: Self-pay

## 2021-03-28 ENCOUNTER — Emergency Department (HOSPITAL_BASED_OUTPATIENT_CLINIC_OR_DEPARTMENT_OTHER): Payer: PRIVATE HEALTH INSURANCE

## 2021-03-28 ENCOUNTER — Emergency Department (HOSPITAL_BASED_OUTPATIENT_CLINIC_OR_DEPARTMENT_OTHER)
Admission: EM | Admit: 2021-03-28 | Discharge: 2021-03-28 | Disposition: A | Discharge status: Home / Self Care | Payer: PRIVATE HEALTH INSURANCE | Attending: Emergency Medicine | Admitting: Emergency Medicine

## 2021-03-28 ENCOUNTER — Encounter (HOSPITAL_BASED_OUTPATIENT_CLINIC_OR_DEPARTMENT_OTHER): Payer: Self-pay | Admitting: Obstetrics and Gynecology

## 2021-03-28 ENCOUNTER — Ambulatory Visit (HOSPITAL_COMMUNITY)
Admission: EM | Admit: 2021-03-28 | Discharge: 2021-03-28 | Disposition: A | Discharge status: Home / Self Care | Payer: PRIVATE HEALTH INSURANCE | Attending: Internal Medicine | Admitting: Internal Medicine

## 2021-03-28 ENCOUNTER — Emergency Department (HOSPITAL_BASED_OUTPATIENT_CLINIC_OR_DEPARTMENT_OTHER): Payer: PRIVATE HEALTH INSURANCE | Admitting: Radiology

## 2021-03-28 DIAGNOSIS — R0789 Other chest pain: Secondary | ICD-10-CM

## 2021-03-28 DIAGNOSIS — I509 Heart failure, unspecified: Secondary | ICD-10-CM | POA: Diagnosis not present

## 2021-03-28 DIAGNOSIS — I10 Essential (primary) hypertension: Secondary | ICD-10-CM

## 2021-03-28 DIAGNOSIS — R0600 Dyspnea, unspecified: Secondary | ICD-10-CM

## 2021-03-28 DIAGNOSIS — Z79899 Other long term (current) drug therapy: Secondary | ICD-10-CM | POA: Diagnosis not present

## 2021-03-28 DIAGNOSIS — F17210 Nicotine dependence, cigarettes, uncomplicated: Secondary | ICD-10-CM | POA: Diagnosis not present

## 2021-03-28 DIAGNOSIS — Z7982 Long term (current) use of aspirin: Secondary | ICD-10-CM | POA: Diagnosis not present

## 2021-03-28 DIAGNOSIS — R000 Tachycardia, unspecified: Secondary | ICD-10-CM | POA: Diagnosis not present

## 2021-03-28 DIAGNOSIS — I110 Hypertensive heart disease with heart failure: Secondary | ICD-10-CM | POA: Diagnosis not present

## 2021-03-28 DIAGNOSIS — R079 Chest pain, unspecified: Secondary | ICD-10-CM | POA: Diagnosis present

## 2021-03-28 DIAGNOSIS — R030 Elevated blood-pressure reading, without diagnosis of hypertension: Secondary | ICD-10-CM

## 2021-03-28 DIAGNOSIS — R0609 Other forms of dyspnea: Secondary | ICD-10-CM

## 2021-03-28 LAB — CBC
HCT: 38.1 % (ref 36.0–46.0)
Hemoglobin: 12.7 g/dL (ref 12.0–15.0)
MCH: 29.3 pg (ref 26.0–34.0)
MCHC: 33.3 g/dL (ref 30.0–36.0)
MCV: 87.8 fL (ref 80.0–100.0)
Platelets: 287 K/uL (ref 150–400)
RBC: 4.34 MIL/uL (ref 3.87–5.11)
RDW: 14.2 % (ref 11.5–15.5)
WBC: 6.5 K/uL (ref 4.0–10.5)
nRBC: 0 % (ref 0.0–0.2)

## 2021-03-28 LAB — TROPONIN I (HIGH SENSITIVITY)
Troponin I (High Sensitivity): 154 ng/L (ref <18)
Troponin I (High Sensitivity): 124 ng/L (ref <18)

## 2021-03-28 LAB — BASIC METABOLIC PANEL WITH GFR
Anion gap: 10 (ref 5–15)
BUN: 18 mg/dL (ref 6–20)
CO2: 22 mmol/L (ref 22–32)
Calcium: 9.9 mg/dL (ref 8.9–10.3)
Chloride: 107 mmol/L (ref 98–111)
Creatinine, Ser: 0.75 mg/dL (ref 0.44–1.00)
GFR, Estimated: >60 mL/min
Glucose, Bld: 103 mg/dL — ABNORMAL HIGH (ref 70–99)
Potassium: 3.6 mmol/L (ref 3.5–5.1)
Sodium: 139 mmol/L (ref 135–145)

## 2021-03-28 LAB — D-DIMER, QUANTITATIVE: D-Dimer, Quant: 0.94 ug/mL-FEU — ABNORMAL HIGH (ref 0.00–0.50)

## 2021-03-28 LAB — PREGNANCY, URINE: Preg Test, Ur: NEGATIVE

## 2021-03-28 LAB — BRAIN NATRIURETIC PEPTIDE: B Natriuretic Peptide: 605.3 pg/mL — ABNORMAL HIGH (ref 0.0–100.0)

## 2021-03-28 MED ORDER — ASPIRIN 81 MG PO CHEW
324.0000 mg | CHEWABLE_TABLET | Freq: Once | ORAL | Status: AC
  Administered 2021-03-28: 324 mg via ORAL
  Filled 2021-03-28: qty 4

## 2021-03-28 MED ORDER — LOSARTAN POTASSIUM 50 MG PO TABS: 50.0000 mg | ORAL_TABLET | Freq: Every day | ORAL | 0 refills | Status: DC

## 2021-03-28 MED ORDER — IOHEXOL 350 MG/ML SOLN
80.0000 mL | Freq: Once | INTRAVENOUS | Status: AC | PRN
  Administered 2021-03-28: 80 mL via INTRAVENOUS

## 2021-03-28 MED ORDER — FUROSEMIDE 40 MG PO TABS: 40.0000 mg | ORAL_TABLET | Freq: Every day | ORAL | 0 refills | Status: DC

## 2021-03-28 MED ORDER — FUROSEMIDE 10 MG/ML IJ SOLN
40.0000 mg | Freq: Once | INTRAMUSCULAR | Status: AC
  Administered 2021-03-28: 40 mg via INTRAVENOUS
  Filled 2021-03-28: qty 4

## 2021-03-28 MED ORDER — LOSARTAN POTASSIUM 25 MG PO TABS
50.0000 mg | ORAL_TABLET | Freq: Once | ORAL | Status: AC
Start: 2021-03-28 — End: 2021-03-28
  Administered 2021-03-28: 50 mg via ORAL
  Filled 2021-03-28: qty 2

## 2021-03-28 MED ORDER — ASPIRIN EC 81 MG PO TBEC: 81.0000 mg | DELAYED_RELEASE_TABLET | Freq: Every day | ORAL | 11 refills | Status: AC

## 2021-03-28 NOTE — ED Triage Notes (Signed)
Patient reports she was sent by UC due to chest pain x1 week. Patient reports it feels like pressure sitting on her chest. Patient endorses ShOb as well as chest pain.

## 2021-03-28 NOTE — ED Triage Notes (Signed)
Pt presents with chest pain. States she feels her chest is on fire and feels it is cutting her breathing off. States it has been going on for 1 week.

## 2021-03-28 NOTE — ED Notes (Signed)
Patient transported to CT 

## 2021-03-28 NOTE — ED Provider Notes (Signed)
[facility] EMERGENCY DEPT Provider Note   CSN: [id number] Arrival date & time: 03/28/21  1310     History Chief Complaint  Patient presents with   Chest Pain    Kathleen [name] is a 55 y.o. female.  HPI 55 year old female presents with chest pain and shortness of breath.  This has been ongoing for about a week.  Patient endorses chest burning that comes and goes.  Nothing specific induces it.  She is also been feeling short of breath more than the pain.  She has noticed that sometimes with walking she will get short of breath.  No new orthopnea or leg swelling or weight gain.  No cough.  No fevers.  No obvious worsening with food.  She has a history of hypertension but denies hyperlipidemia, cardiac disease,.  She is not currently taking blood pressure medicine.  Past Medical History:  Diagnosis Date   Hypertension     Patient Active Problem List   Diagnosis Date Noted   OA (osteoarthritis) of knee 05/02/2014   Essential hypertension, benign 04/13/2014   Right knee pain 04/13/2014   Tobacco use disorder 04/13/2014   Left pyosalpinx 02/04/2014    Past Surgical History:  Procedure Laterality Date   CESAREAN SECTION     3 cesarean sections     OB History     Gravida  6   Para  3   Term  3   Preterm  0   AB  3   Living  3      SAB  3   IAB  0   Ectopic  0   Multiple  0   Live Births              Family History  Problem Relation Age of Onset   Cancer Mother        Female   Hypertension Mother     Social History   Tobacco Use   Smoking status: Every Day    Packs/day: 0.50    Types: Cigarettes   Smokeless tobacco: Never  Substance Use Topics   Alcohol use: No   Drug use: No    Home Medications Prior to Admission medications   Medication Sig Start Date End Date Taking? Authorizing Provider  aspirin EC 81 MG tablet Take 1 tablet (81 mg total) by mouth daily. Swallow whole. 03/28/21  Yes Pricilla Loveless, MD  furosemide  (LASIX) 40 MG tablet Take 1 tablet (40 mg total) by mouth daily. 03/28/21  Yes Pricilla Loveless, MD  losartan (COZAAR) 50 MG tablet Take 1 tablet (50 mg total) by mouth daily. 03/28/21  Yes Pricilla Loveless, MD  ibuprofen (ADVIL,MOTRIN) 600 MG tablet Take 1 tablet (600 mg total) by mouth every 6 (six) hours as needed (mild pain). 02/06/14   Willodean Rosenthal, MD  Multiple Vitamins-Minerals (ONE-A-DAY ENERGY PO) Take 1 tablet by mouth daily.    [provider]  naproxen (NAPROSYN) 500 MG tablet Take 1 tablet (500 mg total) by mouth 2 (two) times daily with a meal. 05/02/14   Gerald, Velna Hatchet, MD  predniSONE (DELTASONE) 5 MG tablet Take 6 pills for first day, 5 pills second day, 4 pills third day, 3 pills fourth day, 2 pills the fifth day, and 1 pill sixth day. 01/06/20   Myra Rude, MD    Allergies    Bee venom and Penicillins  Review of Systems   Review of Systems  Constitutional:  Negative for fever and unexpected weight change.  Respiratory:  Positive for shortness of breath.   Cardiovascular:  Positive for chest pain. Negative for leg swelling.  Gastrointestinal:  Negative for abdominal pain.  All other systems reviewed and are negative.  Physical Exam Updated Vital Signs BP (!) 179/101   Pulse 97   Temp 98.3 F (36.8 C)   Resp (!) 27   Ht  (1.626 m)   Wt 74.8 kg   LMP 01/02/2016   SpO2 95%   BMI 28.32 kg/m   Physical Exam Vitals and nursing note reviewed.  Constitutional:      Appearance: She is well-developed.  HENT:     Head: Normocephalic and atraumatic.     Right Ear: External ear normal.     Left Ear: External ear normal.     Nose: Nose normal.  Eyes:     General:        Right eye: No discharge.        Left eye: No discharge.  Cardiovascular:     Rate and Rhythm: Regular rhythm. Tachycardia present.     Heart sounds: Normal heart sounds.     Comments: HR~100 Pulmonary:     Effort: Pulmonary effort is normal.     Breath sounds: Decreased  breath sounds (slightly decreased at the bases) present.  Chest:     Chest wall: No tenderness.  Abdominal:     Palpations: Abdomen is soft.     Tenderness: There is no abdominal tenderness.  Musculoskeletal:     Right lower leg: No edema.     Left lower leg: No edema.  Skin:    General: Skin is warm and dry.  Neurological:     Mental Status: She is alert.  Psychiatric:        Mood and Affect: Mood is not anxious.    ED Results / Procedures / Treatments   Labs (all labs ordered are listed, but only abnormal results are displayed) Labs Reviewed  BASIC METABOLIC PANEL - Abnormal; Notable for the following components:      Result Value   Glucose, Bld 103 (*)    All other components within normal limits  BRAIN NATRIURETIC PEPTIDE - Abnormal; Notable for the following components:   B Natriuretic Peptide 605.3 (*)    All other components within normal limits  D-DIMER, QUANTITATIVE - Abnormal; Notable for the following components:   D-Dimer, Quant 0.94 (*)    All other components within normal limits  TROPONIN I (HIGH SENSITIVITY) - Abnormal; Notable for the following components:   Troponin I (High Sensitivity) 154 (*)    All other components within normal limits  TROPONIN I (HIGH SENSITIVITY) - Abnormal; Notable for the following components:   Troponin I (High Sensitivity) 124 (*)    All other components within normal limits  CBC  PREGNANCY, URINE    EKG None  Radiology DG Chest 2 View  Result Date: 03/28/2021 CLINICAL DATA:  Chest pain EXAM: CHEST - 2 VIEW COMPARISON:  02/01/2012 FINDINGS: Mild cardiac enlargement. No pleural effusion. Pulmonary vascular congestion. No airspace densities. Visualized osseous structures are unremarkable. IMPRESSION: Cardiac enlargement and pulmonary vascular congestion. Electronically Signed   By: Signa Kell M.D.   On: 03/28/2021 14:13   CT Angio Chest PE W and/or Wo Contrast  Result Date: 03/28/2021 CLINICAL DATA:  Positive D-dimer.   Concern for pulmonary embolism. EXAM: CT ANGIOGRAPHY CHEST WITH CONTRAST TECHNIQUE: Multidetector CT imaging of the chest was performed using the standard protocol during bolus administration of intravenous contrast.  Multiplanar CT image reconstructions and MIPs were obtained to evaluate the vascular anatomy. CONTRAST:  69mL OMNIPAQUE IOHEXOL 350 MG/ML SOLN COMPARISON:  Chest radiograph dated 03/28/2021. FINDINGS: Cardiovascular: Mild cardiomegaly. No pericardial effusion. There is retrograde flow of contrast from the right atrium into the IVC. The thoracic aorta is grossly unremarkable for the degree of opacification. Evaluation of the pulmonary arteries is limited due to respiratory motion. No pulmonary artery embolus identified. Mediastinum/Nodes: Mildly enlarged right hilar and subcarinal lymph nodes. Right hilar lymph nodes measure 15 mm short axis and subcarinal lymph nodes measure 18 mm short axis. There is apparent faint calcification, likely representing partially calcified granuloma. The esophagus is grossly unremarkable. No mediastinal fluid collection. Lungs/Pleura: There is diffuse hazy airspace density throughout the lungs. There is diffuse interstitial and interlobular septal prominence as well as scattered small clusters of ground-glass density. Findings may represent edema, although atypical pneumonia is not excluded clinical correlation is recommended. No pleural effusion or pneumothorax. The central airways are patent. Upper Abdomen: No acute abnormality. Musculoskeletal: Degenerative changes of the spine. No acute osseous pathology. Review of the MIP images confirms the above findings. IMPRESSION: 1. No CT evidence of pulmonary embolism. 2. Diffuse hazy airspace density throughout the lungs may represent edema, although atypical pneumonia is not excluded. Clinical correlation is recommended. 3. Mildly enlarged right hilar and subcarinal lymph nodes, likely reactive. Electronically Signed   By:  Elgie Collard M.D.   On: 03/28/2021 20:26    Procedures Procedures   Medications Ordered in ED Medications  aspirin chewable tablet 324 mg (324 mg Oral Given 03/28/21 1908)  iohexol (OMNIPAQUE) 350 MG/ML injection 80 mL (80 mLs Intravenous Contrast Given 03/28/21 1939)  furosemide (LASIX) injection 40 mg (40 mg Intravenous Given 03/28/21 2103)  losartan (COZAAR) tablet 50 mg (50 mg Oral Given 03/28/21 2103)    ED Course  I have reviewed the triage vital signs and the nursing notes.  Pertinent labs & imaging results that were available during my care of the patient were reviewed by me and considered in my medical decision making (see chart for details).    MDM Rules/Calculators/A&P                           Overall patient seems to be having new onset CHF in the setting of poorly controlled hypertension.  Her troponins are elevated but flat, and most likely from CHF.  No evidence of PE.  She is not in distress but does have poorly controlled blood pressure and some tachycardia.  I discussed she needs to be admitted for further work-up and treatment but she declines.  She will leave AGAINST MEDICAL ADVICE and appears capable of making this decision and understands potential risk/consequences including death.  I did discuss with Dr. Sharyn Lull for close outpatient follow-up and he agrees to follow her up and asked for 40 mg Lasix on discharge as well as losartan 50 mg.  We will give IV Lasix here.  Given aspirin.  She was encouraged she can return at any time. Final Clinical Impression(s) / ED Diagnoses Final diagnoses:  New onset of congestive heart failure (HCC)  Uncontrolled hypertension    Rx / DC Orders ED Discharge Orders          Ordered    aspirin EC 81 MG tablet  Daily        03/28/21 2049    furosemide (LASIX) 40 MG tablet  Daily  03/28/21 2049    losartan (COZAAR) 50 MG tablet  Daily        03/28/21 2049             Pricilla Loveless, MD 03/28/21 2324

## 2021-03-28 NOTE — Discharge Instructions (Addendum)
You are leaving AGAINST MEDICAL ADVICE.  If at any point you change your mind or you get worse you should return to the ER for evaluation.  Otherwise start the medicines prescribed and follow-up closely with the cardiologist listed.

## 2021-03-28 NOTE — Discharge Instructions (Signed)
[facility] [address] Address: [address], [place], [place] [address] Phone: [phone] 

## 2021-04-01 NOTE — ED Provider Notes (Signed)
[facility]    CSN: [id number] Arrival date & time: 03/28/21  1031      History   Chief Complaint Chief Complaint  Patient presents with   Chest Pain   Shortness of Breath    HPI Kathleen [name] is a 55 y.o. female.   Patient presenting today with intermittent chest pain and difficulty breathing for about 1 week, much worse since awakening this morning. She states it feels like her chest is on fire and the sensation is cutting off her breath. She states this has never happened to her before this episode. Denies recent medication or lifestyle changes, fever, chills, recent illness or new sick contacts, congestion, sore throat, leg swelling, history of DVT or coag disorders. Hx of HTN on losartan for which she is compliant, no other known cardiopulmonary issues. Does smoke 1/2 ppd cigarettes.    Past Medical History:  Diagnosis Date   Hypertension     Patient Active Problem List   Diagnosis Date Noted   OA (osteoarthritis) of knee 05/02/2014   Essential hypertension, benign 04/13/2014   Right knee pain 04/13/2014   Tobacco use disorder 04/13/2014   Left pyosalpinx 02/04/2014    Past Surgical History:  Procedure Laterality Date   CESAREAN SECTION     3 cesarean sections    OB History     Gravida  6   Para  3   Term  3   Preterm  0   AB  3   Living  3      SAB  3   IAB  0   Ectopic  0   Multiple  0   Live Births               Home Medications    Prior to Admission medications   Medication Sig Start Date End Date Taking? Authorizing Provider  aspirin EC 81 MG tablet Take 1 tablet (81 mg total) by mouth daily. Swallow whole. 03/28/21   Pricilla Loveless, MD  furosemide (LASIX) 40 MG tablet Take 1 tablet (40 mg total) by mouth daily. 03/28/21   Pricilla Loveless, MD  ibuprofen (ADVIL,MOTRIN) 600 MG tablet Take 1 tablet (600 mg total) by mouth every 6 (six) hours as needed (mild pain). 02/06/14   Willodean Rosenthal, MD  losartan (COZAAR)  50 MG tablet Take 1 tablet (50 mg total) by mouth daily. 03/28/21   Pricilla Loveless, MD  Multiple Vitamins-Minerals (ONE-A-DAY ENERGY PO) Take 1 tablet by mouth daily.    [provider]  naproxen (NAPROSYN) 500 MG tablet Take 1 tablet (500 mg total) by mouth 2 (two) times daily with a meal. 05/02/14   [place], Velna Hatchet, MD  predniSONE (DELTASONE) 5 MG tablet Take 6 pills for first day, 5 pills second day, 4 pills third day, 3 pills fourth day, 2 pills the fifth day, and 1 pill sixth day. 01/06/20   Myra Rude, MD    Family History Family History  Problem Relation Age of Onset   Cancer Mother        Female   Hypertension Mother     Social History Social History   Tobacco Use   Smoking status: Every Day    Packs/day: 0.50    Types: Cigarettes   Smokeless tobacco: Never  Substance Use Topics   Alcohol use: No   Drug use: No     Allergies   Bee venom and Penicillins   Review of Systems Review of Systems PER HPI  Physical Exam Triage Vital Signs ED Triage Vitals  Enc Vitals Group     BP 03/28/21 1058 (!) 175/107     Pulse Rate 03/28/21 1058 96     Resp 03/28/21 1058 19     Temp 03/28/21 1058 97.9 F (36.6 C)     Temp Source 03/28/21 1058 Oral     SpO2 03/28/21 1058 100 %     Weight --      Height --      Head Circumference --      Peak Flow --      Pain Score 03/28/21 1055 10     Pain Loc --      Pain Edu? --      Excl. in GC? --    No data found.  Updated Vital Signs BP (!) 175/107 (BP Location: Right Arm)   Pulse 96   Temp 97.9 F (36.6 C) (Oral)   Resp 19   LMP 01/02/2016   SpO2 100%   Visual Acuity Right Eye Distance:   Left Eye Distance:   Bilateral Distance:    Right Eye Near:   Left Eye Near:    Bilateral Near:     Physical Exam Constitutional:      Comments: Does appear in mild distress, with mildly labored breathing and anxiousness  HENT:     Head: Atraumatic.     Nose: Nose normal.     Mouth/Throat:     Mouth:  Mucous membranes are moist.     Pharynx: Oropharynx is clear. No posterior oropharyngeal erythema.  Eyes:     Extraocular Movements: Extraocular movements intact.     Conjunctiva/sclera: Conjunctivae normal.  Pulmonary:     Comments: Mildly labored breathing, tachypneic at rest on room air and unable to speak full sentences. Diminished breath sounds at lung bases b/l.  Abdominal:     General: Bowel sounds are normal.     Palpations: Abdomen is soft.  Musculoskeletal:        General: No swelling.  Neurological:     Mental Status: She is alert.  Psychiatric:     Comments: anxious     UC Treatments / Results  Labs (all labs ordered are listed, but only abnormal results are displayed) Labs Reviewed - No data to display  EKG   Radiology No results found.  Procedures Procedures (including critical care time)  Medications Ordered in UC Medications - No data to display  Initial Impression / Assessment and Plan / UC Course  I have reviewed the triage vital signs and the nursing notes.  Pertinent labs & imaging results that were available during my care of the patient were reviewed by me and considered in my medical decision making (see chart for details).     EKG today showing sinus tachycardia at 103 bpm and LVH, no ST elevations. Her O2 saturation in triage was 100% on room air but visibly labored breathing at rest. BP significant elevated at this time additionally. Evaluated patient in triage and it was determined that she would benefit from a higher level of care to further work up and manage her chest pain and difficulty breathing. Patient is agreeable and will call one of her children to bring her to ED. Declines EMS transport at this time.   Final Clinical Impressions(s) / UC Diagnoses   Final diagnoses:  Dyspnea on exertion  Elevated blood pressure reading  Chest pressure     Discharge Instructions      [place]  [facility] Address: [address], [place], [place] [address] Phone: [phone]     ED Prescriptions   None    PDMP not reviewed this encounter.   Particia Nearing, [place] 04/01/21 2240

## 2021-04-16 ENCOUNTER — Ambulatory Visit
Admission: RE | Admit: 2021-04-16 | Discharge: 2021-04-16 | Disposition: A | Discharge status: Home / Self Care | Payer: PRIVATE HEALTH INSURANCE | Attending: Internal Medicine | Admitting: Internal Medicine

## 2021-04-16 ENCOUNTER — Encounter: Payer: Self-pay | Admitting: Internal Medicine

## 2021-04-16 ENCOUNTER — Ambulatory Visit
Admission: RE | Admit: 2021-04-16 | Discharge: 2021-04-16 | Disposition: A | Discharge status: Home / Self Care | Payer: PRIVATE HEALTH INSURANCE | Source: Ambulatory Visit | Attending: Internal Medicine | Admitting: Internal Medicine

## 2021-04-16 ENCOUNTER — Other Ambulatory Visit: Payer: Self-pay

## 2021-04-16 ENCOUNTER — Ambulatory Visit (INDEPENDENT_AMBULATORY_CARE_PROVIDER_SITE_OTHER): Payer: PRIVATE HEALTH INSURANCE | Admitting: Internal Medicine

## 2021-04-16 VITALS — BP 153/90 | HR 82 | Ht 64.0 in | Wt 195.3 lb

## 2021-04-16 DIAGNOSIS — I10 Essential (primary) hypertension: Secondary | ICD-10-CM

## 2021-04-16 DIAGNOSIS — M1711 Unilateral primary osteoarthritis, right knee: Secondary | ICD-10-CM | POA: Diagnosis not present

## 2021-04-16 DIAGNOSIS — J189 Pneumonia, unspecified organism: Secondary | ICD-10-CM

## 2021-04-16 DIAGNOSIS — F17200 Nicotine dependence, unspecified, uncomplicated: Secondary | ICD-10-CM | POA: Diagnosis not present

## 2021-04-16 DIAGNOSIS — M25561 Pain in right knee: Secondary | ICD-10-CM | POA: Diagnosis present

## 2021-04-16 NOTE — Assessment & Plan Note (Signed)
-   I instructed the patient to stop smoking and provided them with smoking cessation materials.  - I informed the patient that smoking puts them at increased risk for cancer, COPD, hypertension, and more.  - Informed the patient to seek help if they begin to have trouble breathing, develop chest pain, start to cough up blood, feel faint, or pass out.  

## 2021-04-16 NOTE — Progress Notes (Signed)
New Patient Office Visit  Subjective:  Patient ID: Kathleen [name], female    DOB: 12/12/1965  Age: 55 y.o. MRN: [id number]  CC:  Chief Complaint  Patient presents with   New Patient (Initial Visit)    HPI Patient presents for pain in rt  knee , rt sided  chest discomfort  Past Medical History:  Diagnosis Date   Hypertension      Current Outpatient Medications:    aspirin EC 81 MG tablet, Take 1 tablet (81 mg total) by mouth daily. Swallow whole., Disp: 30 tablet, Rfl: 11   carvedilol (COREG) 6.25 MG tablet, Take 6.25 mg by mouth 2 (two) times daily with a meal., Disp: , Rfl:    furosemide (LASIX) 40 MG tablet, Take 1 tablet (40 mg total) by mouth daily., Disp: 30 tablet, Rfl: 0   ibuprofen (ADVIL,MOTRIN) 600 MG tablet, Take 1 tablet (600 mg total) by mouth every 6 (six) hours as needed (mild pain)., Disp: 30 tablet, Rfl: 0   losartan (COZAAR) 50 MG tablet, Take 1 tablet (50 mg total) by mouth daily., Disp: 30 tablet, Rfl: 0   Multiple Vitamins-Minerals (ONE-A-DAY ENERGY PO), Take 1 tablet by mouth daily., Disp: , Rfl:    spironolactone (ALDACTONE) 25 MG tablet, Take 25 mg by mouth daily., Disp: , Rfl:    Past Surgical History:  Procedure Laterality Date   CESAREAN SECTION     3 cesarean sections    Family History  Problem Relation Age of Onset   Cancer Mother        Female   Hypertension Mother     Social History   Socioeconomic History   Marital status: Married    Spouse name: Not on file   Number of children: Not on file   Years of education: Not on file   Highest education level: Not on file  Occupational History   Not on file  Tobacco Use   Smoking status: Every Day    Packs/day: 0.50    Types: Cigarettes   Smokeless tobacco: Never  Substance and Sexual Activity   Alcohol use: No   Drug use: No   Sexual activity: Yes    Birth control/protection: None  Other Topics Concern   Not on file  Social History Narrative   Not on file   Social  Determinants of Health   Financial Resource Strain: Not on file  Food Insecurity: Not on file  Transportation Needs: Not on file  Physical Activity: Not on file  Stress: Not on file  Social Connections: Not on file  Intimate Partner Violence: Not on file    ROS Review of Systems  Constitutional: Negative.   HENT: Negative.    Eyes: Negative.  Negative for pain.  Respiratory:  Positive for cough and chest tightness.   Cardiovascular:  Positive for leg swelling.  Gastrointestinal: Negative.  Negative for abdominal distention.  Endocrine: Negative.   Genitourinary: Negative.   Musculoskeletal: Negative.   Skin: Negative.   Allergic/Immunologic: Negative.   Neurological: Negative.   Hematological: Negative.   Psychiatric/Behavioral: Negative.    All other systems reviewed and are negative.  Objective:   Today's Vitals: BP (!) 153/90   Pulse 82   Ht 5\' 4"  (1.626 m)   Wt 195 lb 4.8 oz (88.6 kg)   LMP 01/02/2016   BMI 33.52 kg/m   Physical Exam Vitals reviewed.  HENT:     Head: Normocephalic.     Nose: Nose normal.  Mouth/Throat:     Mouth: Mucous membranes are moist.  Cardiovascular:     Rate and Rhythm: Normal rate and regular rhythm.  Pulmonary:     Effort: Pulmonary effort is normal.     Comments: Decreased bs Chest:     Chest wall: No tenderness.  Musculoskeletal:        General: Swelling (rt knee pain) present.     Cervical back: Normal range of motion.  Skin:    General: Skin is warm and dry.     Coloration: Skin is not jaundiced.     Findings: No bruising, erythema, lesion or rash.  Neurological:     General: No focal deficit present.  Psychiatric:        Mood and Affect: Mood normal.    Assessment & Plan:   Problem List Items Addressed This Visit       Cardiovascular and Mediastinum   Essential hypertension, benign    Blood pressure is borderline elevated on today's visit she was advised to follow low-salt diet.      Relevant  Medications   carvedilol (COREG) 6.25 MG tablet   spironolactone (ALDACTONE) 25 MG tablet     Respiratory   Atypical pneumonia    Patient chest x-ray and CT scan is abnormal, she complains of some tightness in the right side of the chest otherwise asymptomatic she is working.  She denies any history of having COVID recently.  She will need a work-up for the abnormal chest CT so we will refer the patient to the pulmonologist.        Musculoskeletal and Integument   OA (osteoarthritis) of knee    Right knee was injected with Kenalog and patient tolerated the procedure well        Other   Right knee pain   Relevant Orders   DG Knee Complete 4 Views Right   Tobacco use disorder    - I instructed the patient to stop smoking and provided them with smoking cessation materials.  - I informed the patient that smoking puts them at increased risk for cancer, COPD, hypertension, and more.  - Informed the patient to seek help if they begin to have trouble breathing, develop chest pain, start to cough up blood, feel faint, or pass out.      Other Visit Diagnoses     Pneumonia due to infectious organism, unspecified laterality, unspecified part of lung    -  Primary   Relevant Orders   DG Chest 2 View     Joint Injection/Arthrocentesis  Date/Time: 04/16/2021 10:55 AM Performed by: Corky Downs, MD Authorized by: Corky Downs, MD  Indications: joint swelling and pain  Body area: knee Joint: right knee Local anesthesia used: yes  Anesthesia: Local anesthesia used: yes Local Anesthetic: lidocaine 1% with epinephrine  Sedation: Patient sedated: no  Preparation: Patient was prepped and draped in the usual sterile fashion. Needle size: 22 G Ultrasound guidance: no Approach: medial Betamethasone amount: 40 mg Comments: Right knee was injected with 1% lidocaine with epinephrine under complete aseptic conditions, knee was prepared with alcohol.  Next the lateral side of the right  knee was injected with 40 mg of Kenalog.  Patient tolerated the procedure well.  She was advised to wait in the waiting room for 5 minutes after the injection.     Outpatient Encounter Medications as of 04/16/2021  Medication Sig   aspirin EC 81 MG tablet Take 1 tablet (81 mg total) by mouth daily. Swallow whole.  carvedilol (COREG) 6.25 MG tablet Take 6.25 mg by mouth 2 (two) times daily with a meal.   furosemide (LASIX) 40 MG tablet Take 1 tablet (40 mg total) by mouth daily.   ibuprofen (ADVIL,MOTRIN) 600 MG tablet Take 1 tablet (600 mg total) by mouth every 6 (six) hours as needed (mild pain).   losartan (COZAAR) 50 MG tablet Take 1 tablet (50 mg total) by mouth daily.   Multiple Vitamins-Minerals (ONE-A-DAY ENERGY PO) Take 1 tablet by mouth daily.   spironolactone (ALDACTONE) 25 MG tablet Take 25 mg by mouth daily.   [DISCONTINUED] naproxen (NAPROSYN) 500 MG tablet Take 1 tablet (500 mg total) by mouth 2 (two) times daily with a meal. (Patient not taking: Reported on 04/16/2021)   [DISCONTINUED] predniSONE (DELTASONE) 5 MG tablet Take 6 pills for first day, 5 pills second day, 4 pills third day, 3 pills fourth day, 2 pills the fifth day, and 1 pill sixth day. (Patient not taking: Reported on 04/16/2021)   No facility-administered encounter medications on file as of 04/16/2021.    Follow-up: No follow-ups on file.   Corky Downs, MD

## 2021-04-16 NOTE — Progress Notes (Signed)
Tx

## 2021-04-16 NOTE — Assessment & Plan Note (Signed)
Blood pressure is borderline elevated on today's visit she was advised to follow low-salt diet.

## 2021-04-16 NOTE — Assessment & Plan Note (Signed)
Right knee was injected with Kenalog and patient tolerated the procedure well

## 2021-04-16 NOTE — Assessment & Plan Note (Signed)
Patient chest x-ray and CT scan is abnormal, she complains of some tightness in the right side of the chest otherwise asymptomatic she is working.  She denies any history of having COVID recently.  She will need a work-up for the abnormal chest CT so we will refer the patient to the pulmonologist.

## 2021-04-21 ENCOUNTER — Encounter: Payer: Self-pay | Admitting: Internal Medicine

## 2021-04-21 ENCOUNTER — Other Ambulatory Visit: Payer: Self-pay

## 2021-04-21 ENCOUNTER — Ambulatory Visit (INDEPENDENT_AMBULATORY_CARE_PROVIDER_SITE_OTHER): Payer: PRIVATE HEALTH INSURANCE | Admitting: Internal Medicine

## 2021-04-21 VITALS — BP 145/90 | HR 96 | Ht 64.0 in | Wt 194.0 lb

## 2021-04-21 DIAGNOSIS — I10 Essential (primary) hypertension: Secondary | ICD-10-CM

## 2021-04-21 DIAGNOSIS — M25561 Pain in right knee: Secondary | ICD-10-CM

## 2021-04-21 DIAGNOSIS — J189 Pneumonia, unspecified organism: Secondary | ICD-10-CM

## 2021-04-21 DIAGNOSIS — M1711 Unilateral primary osteoarthritis, right knee: Secondary | ICD-10-CM | POA: Diagnosis not present

## 2021-04-21 DIAGNOSIS — F17200 Nicotine dependence, unspecified, uncomplicated: Secondary | ICD-10-CM

## 2021-04-21 DIAGNOSIS — G8929 Other chronic pain: Secondary | ICD-10-CM

## 2021-04-21 NOTE — Assessment & Plan Note (Signed)
Repeat chest x-ray is normal without any infiltrate heart size is normal

## 2021-04-21 NOTE — Assessment & Plan Note (Signed)
-   I instructed the patient to stop smoking and provided them with smoking cessation materials.  - I informed the patient that smoking puts them at increased risk for cancer, COPD, hypertension, and more.  - Informed the patient to seek help if they begin to have trouble breathing, develop chest pain, start to cough up blood, feel faint, or pass out.  

## 2021-04-21 NOTE — Progress Notes (Signed)
Established Patient Office Visit  Subjective:  Patient ID: Kathleen [name], female    DOB: 05-25-1966  Age: 55 y.o. MRN: [id number]  CC:  Chief Complaint  Patient presents with   Follow-up    follow    HPI  Kathleen [name] presents for general check up Past Medical History:  Diagnosis Date   Hypertension     Past Surgical History:  Procedure Laterality Date   CESAREAN SECTION     3 cesarean sections    Family History  Problem Relation Age of Onset   Cancer Mother        Female   Hypertension Mother     Social History   Socioeconomic History   Marital status: Married    Spouse name: Not on file   Number of children: Not on file   Years of education: Not on file   Highest education level: Not on file  Occupational History   Not on file  Tobacco Use   Smoking status: Every Day    Packs/day: 0.50    Types: Cigarettes   Smokeless tobacco: Never  Substance and Sexual Activity   Alcohol use: No   Drug use: No   Sexual activity: Yes    Birth control/protection: None  Other Topics Concern   Not on file  Social History Narrative   Not on file   Social Determinants of Health   Financial Resource Strain: Not on file  Food Insecurity: Not on file  Transportation Needs: Not on file  Physical Activity: Not on file  Stress: Not on file  Social Connections: Not on file  Intimate Partner Violence: Not on file     Current Outpatient Medications:    aspirin EC 81 MG tablet, Take 1 tablet (81 mg total) by mouth daily. Swallow whole., Disp: 30 tablet, Rfl: 11   carvedilol (COREG) 6.25 MG tablet, Take 6.25 mg by mouth 2 (two) times daily with a meal., Disp: , Rfl:    furosemide (LASIX) 40 MG tablet, Take 1 tablet (40 mg total) by mouth daily., Disp: 30 tablet, Rfl: 0   ibuprofen (ADVIL,MOTRIN) 600 MG tablet, Take 1 tablet (600 mg total) by mouth every 6 (six) hours as needed (mild pain)., Disp: 30 tablet, Rfl: 0   losartan (COZAAR) 50 MG tablet, Take 1 tablet (50  mg total) by mouth daily., Disp: 30 tablet, Rfl: 0   Multiple Vitamins-Minerals (ONE-A-DAY ENERGY PO), Take 1 tablet by mouth daily., Disp: , Rfl:    spironolactone (ALDACTONE) 25 MG tablet, Take 25 mg by mouth daily., Disp: , Rfl:    Allergies  Allergen Reactions   Bee Venom Anaphylaxis and Swelling   Penicillins     ROS Review of Systems  Constitutional: Negative.   HENT: Negative.    Eyes: Negative.   Respiratory: Negative.    Cardiovascular: Negative.   Gastrointestinal: Negative.   Endocrine: Negative.   Genitourinary: Negative.   Musculoskeletal: Negative.   Skin: Negative.   Allergic/Immunologic: Negative.   Neurological: Negative.   Hematological: Negative.   Psychiatric/Behavioral: Negative.    All other systems reviewed and are negative.    Objective:    Physical Exam Vitals reviewed.  Constitutional:      Appearance: Normal appearance.  HENT:     Mouth/Throat:     Mouth: Mucous membranes are moist.  Eyes:     Pupils: Pupils are equal, round, and reactive to light.  Neck:     Vascular: No carotid bruit.  Cardiovascular:  Rate and Rhythm: Normal rate and regular rhythm.     Pulses: Normal pulses.     Heart sounds: Normal heart sounds.  Pulmonary:     Effort: Pulmonary effort is normal.     Breath sounds: Normal breath sounds.  Abdominal:     General: Bowel sounds are normal.     Palpations: Abdomen is soft. There is no hepatomegaly, splenomegaly or mass.     Tenderness: There is no abdominal tenderness.     Hernia: No hernia is present.  Musculoskeletal:        General: No tenderness.     Cervical back: Neck supple.     Right lower leg: No edema.     Left lower leg: No edema.  Skin:    Findings: No rash.  Neurological:     Mental Status: She is alert and oriented to person, place, and time.     Motor: No weakness.  Psychiatric:        Mood and Affect: Mood and affect normal.        Behavior: Behavior normal.    BP (!) 145/90   Pulse 96    Ht 5\' 4"  (1.626 m)   Wt 194 lb (88 kg)   LMP 01/02/2016   SpO2 98%   BMI 33.30 kg/m  Wt Readings from Last 3 Encounters:  04/21/21 194 lb (88 kg)  04/16/21 195 lb 4.8 oz (88.6 kg)  03/28/21 165 lb (74.8 kg)     Health Maintenance Due  Topic Date Due   COVID-19 Vaccine (1) Never done   Hepatitis C Screening  Never done   TETANUS/TDAP  Never done   PAP SMEAR-Modifier  Never done   COLONOSCOPY (Pts 45-39yrs Insurance coverage will need to be confirmed)  Never done   MAMMOGRAM  Never done   Zoster Vaccines- Shingrix (1 of 2) Never done   INFLUENZA VACCINE  02/17/2021    There are no preventive care reminders to display for this patient.  Lab Results  Component Value Date   TSH 0.858 04/13/2014   Lab Results  Component Value Date   WBC 6.5 03/28/2021   HGB 12.7 03/28/2021   HCT 38.1 03/28/2021   MCV 87.8 03/28/2021   PLT 287 03/28/2021   Lab Results  Component Value Date   NA 139 03/28/2021   K 3.6 03/28/2021   CO2 22 03/28/2021   GLUCOSE 103 (H) 03/28/2021   BUN 18 03/28/2021   CREATININE 0.75 03/28/2021   BILITOT 0.3 04/13/2014   ALKPHOS 77 04/13/2014   AST 15 04/13/2014   ALT 14 04/13/2014   PROT 7.3 04/13/2014   ALBUMIN 4.1 04/13/2014   CALCIUM 9.9 03/28/2021   ANIONGAP 10 03/28/2021   Lab Results  Component Value Date   CHOL 194 04/13/2014   Lab Results  Component Value Date   HDL 49 04/13/2014   Lab Results  Component Value Date   LDLCALC 128 (H) 04/13/2014   Lab Results  Component Value Date   TRIG 84 04/13/2014   Lab Results  Component Value Date   CHOLHDL 4.0 04/13/2014   No results found for: HGBA1C    Assessment & Plan:   Problem List Items Addressed This Visit       Cardiovascular and Mediastinum   Essential hypertension, benign - Primary     Patient denies any chest pain or shortness of breath there is no history of palpitation or paroxysmal nocturnal dyspnea   patient was advised to follow low-salt low-cholesterol  diet    ideally I want to keep systolic blood pressure below 887 mmHg, patient was asked to check blood pressure one times a week and give me a report on that.  Patient will be follow-up in 3 months  or earlier as needed, patient will call me back for any change in the cardiovascular symptoms Patient was advised to buy a book from local bookstore concerning blood pressure and read several chapters  every day.  This will be supplemented by some of the material we will give him from the office.  Patient should also utilize other resources like YouTube and Internet to learn more about the blood pressure and the diet.      Relevant Orders   Electrolyte panel     Respiratory   Atypical pneumonia    Repeat chest x-ray is normal without any infiltrate heart size is normal        Musculoskeletal and Integument   OA (osteoarthritis) of knee    Patient was advised to see his orthopedic doctor        Other   Right knee pain   Relevant Orders   Ambulatory referral to Orthopedics   Tobacco use disorder    - I instructed the patient to stop smoking and provided them with smoking cessation materials.  - I informed the patient that smoking puts them at increased risk for cancer, COPD, hypertension, and more.  - Informed the patient to seek help if they begin to have trouble breathing, develop chest pain, start to cough up blood, feel faint, or pass out.       No orders of the defined types were placed in this encounter.   Follow-up: No follow-ups on file.    Corky Downs, MD

## 2021-04-21 NOTE — Assessment & Plan Note (Signed)
Patient was advised to see his orthopedic doctor

## 2021-04-21 NOTE — Assessment & Plan Note (Signed)

## 2021-04-22 LAB — ELECTROLYTE PANEL
CO2: 25 mmol/L (ref 20–32)
Chloride: 104 mmol/L (ref 98–110)
Potassium: 4.2 mmol/L (ref 3.5–5.3)
Sodium: 141 mmol/L (ref 135–146)

## 2021-04-23 ENCOUNTER — Telehealth: Payer: Self-pay

## 2021-04-23 NOTE — Telephone Encounter (Signed)
Left vm to confirm 04/24/21 appointment-Toni

## 2021-04-24 ENCOUNTER — Ambulatory Visit: Payer: PRIVATE HEALTH INSURANCE | Admitting: Internal Medicine

## 2021-04-24 ENCOUNTER — Other Ambulatory Visit: Payer: Self-pay

## 2021-04-24 ENCOUNTER — Encounter: Payer: Self-pay | Admitting: Internal Medicine

## 2021-04-24 VITALS — BP 146/84 | HR 89 | Temp 98.4°F | Resp 16 | Ht 64.0 in | Wt 193.8 lb

## 2021-04-24 DIAGNOSIS — K219 Gastro-esophageal reflux disease without esophagitis: Secondary | ICD-10-CM | POA: Diagnosis not present

## 2021-04-24 DIAGNOSIS — G4719 Other hypersomnia: Secondary | ICD-10-CM

## 2021-04-24 DIAGNOSIS — J449 Chronic obstructive pulmonary disease, unspecified: Secondary | ICD-10-CM | POA: Diagnosis not present

## 2021-04-24 DIAGNOSIS — J811 Chronic pulmonary edema: Secondary | ICD-10-CM

## 2021-04-24 NOTE — Patient Instructions (Signed)
Chronic Obstructive Pulmonary Disease Chronic obstructive pulmonary disease (COPD) is a long-term (chronic) lung problem. When you have COPD, it is hard for air to get in and out of your lungs. Usually the condition gets worse over time, and your lungs will never return to normal. There are things you can do to keep yourself as healthy as possible. What are the causes? Smoking. This is the most common cause. Certain genes passed from parent to child (inherited). What increases the risk? Being exposed to secondhand smoke from cigarettes, pipes, or cigars. Being exposed to chemicals and other irritants, such as fumes and dust in the work environment. Having chronic lung conditions or infections. What are the signs or symptoms? Shortness of breath, especially during physical activity. A long-term cough with a large amount of thick mucus. Sometimes, the cough may not have any mucus (dry cough). Wheezing. Breathing quickly. Skin that looks gray or blue, especially in the fingers, toes, or lips. Feeling tired (fatigue). Weight loss. Chest tightness. Having infections often. Episodes when breathing symptoms become much worse (exacerbations). At the later stages of this disease, you may have swelling in the ankles, feet, or legs. How is this treated? Taking medicines. Quitting smoking, if you smoke. Rehabilitation. This includes steps to make your body work better. It may involve a team of specialists. Doing exercises. Making changes to your diet. Using oxygen. Lung surgery. Lung transplant. Comfort measures (palliative care). Follow these instructions at home: Medicines Take over-the-counter and prescription medicines only as told by your doctor. Talk to your doctor before taking any cough or allergy medicines. You may need to avoid medicines that cause your lungs to be dry. Lifestyle If you smoke, stop smoking. Smoking makes the problem worse. Do not smoke or use any products that  contain nicotine or tobacco. If you need help quitting, ask your doctor. Avoid being around things that make your breathing worse. This may include smoke, chemicals, and fumes. Stay active, but remember to rest as well. Learn and use tips on how to manage stress and control your breathing. Make sure you get enough sleep. Most adults need at least 7 hours of sleep every night. Eat healthy foods. Eat smaller meals more often. Rest before meals. Controlled breathing Learn and use tips on how to control your breathing as told by your doctor. Try: Breathing in (inhaling) through your nose for 1 second. Then, pucker your lips and breath out (exhale) through your lips for 2 seconds. Putting one hand on your belly (abdomen). Breathe in slowly through your nose for 1 second. Your hand on your belly should move out. Pucker your lips and breathe out slowly through your lips. Your hand on your belly should move in as you breathe out.  Controlled coughing Learn and use controlled coughing to clear mucus from your lungs. Follow these steps: Lean your head a little forward. Breathe in deeply. Try to hold your breath for 3 seconds. Keep your mouth slightly open while coughing 2 times. Spit any mucus out into a tissue. Rest and do the steps again 1 or 2 times as needed. General instructions Make sure you get all the shots (vaccines) that your doctor recommends. Ask your doctor about a flu shot and a pneumonia shot. Use oxygen therapy and pulmonary rehabilitation if told by your doctor. If you need home oxygen therapy, ask your doctor if you should buy a tool to measure your oxygen level (oximeter). Make a COPD action plan with your doctor. This helps you to   know what to do if you feel worse than usual. Manage any other conditions you have as told by your doctor. Avoid going outside when it is very hot, cold, or humid. Avoid people who have a sickness you can catch (contagious). Keep all follow-up  visits. Contact a doctor if: You cough up more mucus than usual. There is a change in the color or thickness of the mucus. It is harder to breathe than usual. Your breathing is faster than usual. You have trouble sleeping. You need to use your medicines more often than usual. You have trouble doing your normal activities such as getting dressed or walking around the house. Get help right away if: You have shortness of breath while resting. You have shortness of breath that stops you from: Being able to talk. Doing normal activities. Your chest hurts for longer than 5 minutes. Your skin color is more blue than usual. Your pulse oximeter shows that you have low oxygen for longer than 5 minutes. You have a fever. You feel too tired to breathe normally. These symptoms may represent a serious problem that is an emergency. Do not wait to see if the symptoms will go away. Get medical help right away. Call your local emergency services (911 in the [place] Do not drive yourself to the hospital. Summary Chronic obstructive pulmonary disease (COPD) is a long-term lung problem. The way your lungs work will never return to normal. Usually the condition gets worse over time. There are things you can do to keep yourself as healthy as possible. Take over-the-counter and prescription medicines only as told by your doctor. If you smoke, stop. Smoking makes the problem worse. This information is not intended to replace advice given to you by your health care provider. Make sure you discuss any questions you have with your health care provider. Document Revised: 05/14/2020 Document Reviewed: 05/14/2020 [employer] Patient Education  2022 [employer] Steps to Quit Smoking Smoking tobacco is the leading cause of preventable death. It can affect almost every organ in the body. Smoking puts you and people around you at risk for many serious, long-lasting (chronic) diseases. Quitting smoking can be hard, but it  is one of the best things that you can do for your health. It is never too late to quit. How do I get ready to quit? When you decide to quit smoking, make a plan to help you succeed. Before you quit: Pick a date to quit. Set a date within the next 2 weeks to give you time to prepare. Write down the reasons why you are quitting. Keep this list in places where you will see it often. Tell your family, friends, and co-workers that you are quitting. Their support is important. Talk with your doctor about the choices that may help you quit. Find out if your health insurance will pay for these treatments. Know the people, places, things, and activities that make you want to smoke (triggers). Avoid them. What first steps can I take to quit smoking? Throw away all cigarettes at home, at work, and in your car. Throw away the things that you use when you smoke, such as ashtrays and lighters. Clean your car. Make sure to empty the ashtray. Clean your home, including curtains and carpets. What can I do to help me quit smoking? Talk with your doctor about taking medicines and seeing a counselor at the same time. You are more likely to succeed when you do both. If you are pregnant or breastfeeding, talk   with your doctor about counseling or other ways to quit smoking. Do not take medicine to help you quit smoking unless your doctor tells you to do so. To quit smoking: Quit right away Quit smoking totally, instead of slowly cutting back on how much you smoke over a period of time. Go to counseling. You are more likely to quit if you go to counseling sessions regularly. Take medicine You may take medicines to help you quit. Some medicines need a prescription, and some you can buy over-the-counter. Some medicines may contain a drug called nicotine to replace the nicotine in cigarettes. Medicines may: Help you to stop having the desire to smoke (cravings). Help to stop the problems that come when you stop  smoking (withdrawal symptoms). Your doctor may ask you to use: Nicotine patches, gum, or lozenges. Nicotine inhalers or sprays. Non-nicotine medicine that is taken by mouth. Find resources Find resources and other ways to help you quit smoking and remain smoke-free after you quit. These resources are most helpful when you use them often. They include: Online chats with a counselor. Phone quitlines. Printed self-help materials. Support groups or group counseling. Text messaging programs. Mobile phone apps. Use apps on your mobile phone or tablet that can help you stick to your quit plan. There are many free apps for mobile phones and tablets as well as websites. Examples include Quit Guide from the CDC and smokefree.gov  What things can I do to make it easier to quit?  Talk to your family and friends. Ask them to support and encourage you. Call a phone quitline (1-800-QUIT-NOW), reach out to support groups, or work with a counselor. Ask people who smoke to not smoke around you. Avoid places that make you want to smoke, such as: Bars. Parties. Smoke-break areas at work. Spend time with people who do not smoke. Lower the stress in your life. Stress can make you want to smoke. Try these things to help your stress: Getting regular exercise. Doing deep-breathing exercises. Doing yoga. Meditating. Doing a body scan. To do this, close your eyes, focus on one area of your body at a time from head to toe. Notice which parts of your body are tense. Try to relax the muscles in those areas. How will I feel when I quit smoking? Day 1 to 3 weeks Within the first 24 hours, you may start to have some problems that come from quitting tobacco. These problems are very bad 2-3 days after you quit, but they do not often last for more than 2-3 weeks. You may get these symptoms: Mood swings. Feeling restless, nervous, angry, or annoyed. Trouble concentrating. Dizziness. Strong desire for high-sugar  foods and nicotine. Weight gain. Trouble pooping (constipation). Feeling like you may vomit (nausea). Coughing or a sore throat. Changes in how the medicines that you take for other issues work in your body. Depression. Trouble sleeping (insomnia). Week 3 and afterward After the first 2-3 weeks of quitting, you may start to notice more positive results, such as: Better sense of smell and taste. Less coughing and sore throat. Slower heart rate. Lower blood pressure. Clearer skin. Better breathing. Fewer sick days. Quitting smoking can be hard. Do not give up if you fail the first time. Some people need to try a few times before they succeed. Do your best to stick to your quit plan, and talk with your doctor if you have any questions or concerns. Summary Smoking tobacco is the leading cause of preventable death. Quitting smoking   can be hard, but it is one of the best things that you can do for your health. When you decide to quit smoking, make a plan to help you succeed. Quit smoking right away, not slowly over a period of time. When you start quitting, seek help from your doctor, family, or friends. This information is not intended to replace advice given to you by your health care provider. Make sure you discuss any questions you have with your health care provider. Document Revised: 03/31/2019 Document Reviewed: 09/24/2018 [employer] Patient Education  2022 [employer]  

## 2021-04-24 NOTE — Progress Notes (Signed)
[facility] [address] [place], [place] [address]  Pulmonary Sleep Medicine   Office Visit Note  Patient Name: Kathleen [name] DOB: [birth year] MRN [id number]  Date of Service: 04/24/2021  Complaints/HPI: SOB, SMOker. She states that she does not have major SOB. Patient has been a smoker states she has cut down. No recent PFT done. She has been a smoker 10 years. She had been having CP and was sent for a CT scan. This shows no PE but had diffuse haziness of her lungs ?fluids. She has not had any ankle edema. No echo was done and apparently ecg looked OK. Patient states she was put on cardiac meds. She states she is not having any chest pain. She does get swelling of her knees on occasion. She states that she does snore. Her ex husband states she did snore heavily. She wakes fairly fresh in the morning depending on how much sleep she gets. She works third shift and so sleeps mostly in the daytime  ROS  General: (-) fever, (-) chills, (-) night sweats, (-) weakness Skin: (-) rashes, (-) itching,. Eyes: (-) visual changes, (-) redness, (-) itching. Nose and Sinuses: (-) nasal stuffiness or itchiness, (-) postnasal drip, (-) nosebleeds, (-) sinus trouble. Mouth and Throat: (-) sore throat, (-) hoarseness. Neck: (-) swollen glands, (-) enlarged thyroid, (-) neck pain. Respiratory: + cough, (-) bloody sputum, + shortness of breath, - wheezing. Cardiovascular: - ankle swelling, (-) chest pain. Lymphatic: (-) lymph node enlargement. Neurologic: (-) numbness, (-) tingling. Psychiatric: (-) anxiety, (-) depression   Current Medication: Outpatient Encounter Medications as of 04/24/2021  Medication Sig   aspirin EC 81 MG tablet Take 1 tablet (81 mg total) by mouth daily. Swallow whole.   carvedilol (COREG) 6.25 MG tablet Take 6.25 mg by mouth 2 (two) times daily with a meal.   Cyanocobalamin (VITAMIN B12 PO) Take by mouth.   furosemide (LASIX) 40 MG tablet Take 1 tablet (40 mg  total) by mouth daily.   ibuprofen (ADVIL,MOTRIN) 600 MG tablet Take 1 tablet (600 mg total) by mouth every 6 (six) hours as needed (mild pain).   losartan (COZAAR) 50 MG tablet Take 1 tablet (50 mg total) by mouth daily.   Multiple Vitamins-Minerals (ONE-A-DAY ENERGY PO) Take 1 tablet by mouth daily.   spironolactone (ALDACTONE) 25 MG tablet Take 25 mg by mouth daily.   No facility-administered encounter medications on file as of 04/24/2021.    Surgical History: Past Surgical History:  Procedure Laterality Date   CESAREAN SECTION     3 cesarean sections    Medical History: Past Medical History:  Diagnosis Date   Hypertension     Family History: Family History  Problem Relation Age of Onset   Cancer Mother        Female   Hypertension Mother     Social History: Social History   Socioeconomic History   Marital status: Married    Spouse name: Not on file   Number of children: Not on file   Years of education: Not on file   Highest education level: Not on file  Occupational History   Not on file  Tobacco Use   Smoking status: Every Day    Packs/day: 0.50    Types: Cigarettes   Smokeless tobacco: Never  Substance and Sexual Activity   Alcohol use: No   Drug use: No   Sexual activity: Yes    Birth control/protection: None  Other Topics Concern   Not  on file  Social History Narrative   Not on file   Social Determinants of Health   Financial Resource Strain: Not on file  Food Insecurity: Not on file  Transportation Needs: Not on file  Physical Activity: Not on file  Stress: Not on file  Social Connections: Not on file  Intimate Partner Violence: Not on file    Vital Signs: Blood pressure (!) 146/84, pulse 89, temperature 98.4 F (36.9 C), resp. rate 16, height 5\' 4"  (1.626 m), weight 193 lb 12.8 oz (87.9 kg), last menstrual period 01/02/2016, SpO2 98 %.  Examination: General Appearance: The patient is well-developed, well-nourished, and in no  distress. Skin: Gross inspection of skin unremarkable. Head: normocephalic, no gross deformities. Eyes: no gross deformities noted. ENT: ears appear grossly normal no exudates. Neck: Supple. No thyromegaly. No LAD. Respiratory: few rhonchi noted. Cardiovascular: Normal S1 and S2 without murmur or rub. Extremities: No cyanosis. pulses are equal. Neurologic: Alert and oriented. No involuntary movements.  LABS: Recent Results (from the past 2160 hour(s))  CBC     Status: None   Collection Time: 03/28/21  1:39 PM  Result Value Ref Range   WBC 6.5 4.0 - 10.5 K/uL   RBC 4.34 3.87 - 5.11 MIL/uL   Hemoglobin 12.7 12.0 - 15.0 g/dL   HCT 05/28/21 10.2 - 58.5 %   MCV 87.8 80.0 - 100.0 fL   MCH 29.3 26.0 - 34.0 pg   MCHC 33.3 30.0 - 36.0 g/dL   RDW 27.7 82.4 - 23.5 %   Platelets 287 150 - 400 K/uL   nRBC 0.0 0.0 - 0.2 %    Comment: Performed at [facility], [address], [place], [place]  Pregnancy, urine     Status: None   Collection Time: 03/28/21  4:45 PM  Result Value Ref Range   Preg Test, Ur NEGATIVE NEGATIVE    Comment:        THE SENSITIVITY OF THIS METHODOLOGY IS >20 mIU/mL. Performed at 05/28/21, [address], [place], [place]   Basic metabolic panel     Status: Abnormal   Collection Time: 03/28/21  5:34 PM  Result Value Ref Range   Sodium 139 135 - 145 mmol/L   Potassium 3.6 3.5 - 5.1 mmol/L   Chloride 107 98 - 111 mmol/L   CO2 22 22 - 32 mmol/L   Glucose, Bld 103 (H) 70 - 99 mg/dL    Comment: Glucose reference range applies only to samples taken after fasting for at least 8 hours.   BUN 18 6 - 20 mg/dL   Creatinine, Ser 05/28/21 0.44 - 1.00 mg/dL   Calcium 9.9 8.9 - 7.61 mg/dL   GFR, Estimated 95.0 >93 mL/min    Comment: (NOTE) Calculated using the CKD-EPI Creatinine Equation (2021)    Anion gap 10 5 - 15    Comment: Performed at [birth year]1972, [address], [place], [place]   Troponin I (High Sensitivity)     Status: Abnormal   Collection Time: 03/28/21  5:34 PM  Result Value Ref Range   Troponin I (High Sensitivity) 154 (HH) <18 ng/L    Comment: CRITICAL RESULT CALLED TO, READ BACK BY AND VERIFIED WITH: M ROBERTS 03/28/21 @ 1845 MTV (NOTE) Elevated high sensitivity troponin I (hsTnI) values and significant  changes across serial measurements may suggest ACS but many other  chronic and acute conditions are known to elevate hsTnI results.  Refer to the Links  section for chest pain algorithms and additional  guidance. Performed at [facility], [address], [place], [place] [address]   Brain natriuretic peptide     Status: Abnormal   Collection Time: 03/28/21  5:34 PM  Result Value Ref Range   B Natriuretic Peptide 605.3 (H) 0.0 - 100.0 pg/mL    Comment: Performed at [facility], [address] [place], [place] [address]  D-dimer, quantitative     Status: Abnormal   Collection Time: 03/28/21  5:34 PM  Result Value Ref Range   D-Dimer, Quant 0.94 (H) 0.00 - 0.50 ug/mL-FEU    Comment: (NOTE) At the manufacturer cut-off value of 0.5 g/mL FEU, this assay has a negative predictive value of 95-100%.This assay is intended for use in conjunction with a clinical pretest probability (PTP) assessment model to exclude pulmonary embolism (PE) and deep venous thrombosis (DVT) in outpatients suspected of PE or DVT. Results should be correlated with clinical presentation. Performed at [facility], [address], [place], [place] [address]   Troponin I (High Sensitivity)     Status: Abnormal   Collection Time: 03/28/21  7:35 PM  Result Value Ref Range   Troponin I (High Sensitivity) 124 (HH) <18 ng/L    Comment: CRITICAL VALUE NOTED.  VALUE IS CONSISTENT WITH PREVIOUSLY REPORTED AND CALLED VALUE. (NOTE) Elevated high sensitivity troponin I (hsTnI) values and significant  changes across serial  measurements may suggest ACS but many other  chronic and acute conditions are known to elevate hsTnI results.  Refer to the Links section for chest pain algorithms and additional  guidance. Performed at [facility], [address] [place], [place] [address]   Electrolyte panel     Status: None   Collection Time: 04/21/21 11:21 AM  Result Value Ref Range   Sodium 141 135 - 146 mmol/L   Potassium 4.2 3.5 - 5.3 mmol/L   Chloride 104 98 - 110 mmol/L   CO2 25 20 - 32 mmol/L    Radiology: DG Chest 2 View  Result Date: 04/17/2021 CLINICAL DATA:  55 year old female with history of pneumonia. EXAM: CHEST - 2 VIEW COMPARISON:  Chest x-ray 03/28/2021. FINDINGS: Lung volumes are normal. No consolidative airspace disease. No pleural effusions. No pneumothorax. No pulmonary nodule or mass noted. Pulmonary vasculature and the cardiomediastinal silhouette are within normal limits. IMPRESSION: No radiographic evidence of acute cardiopulmonary disease. Electronically Signed   By: Trudie Reed M.D.   On: 04/17/2021 13:49   DG Knee Complete 4 Views Right  Result Date: 04/18/2021 CLINICAL DATA:  Knee pain EXAM: RIGHT KNEE - COMPLETE 4+ VIEW COMPARISON:  04/25/2014 FINDINGS: Osseous mineralization low normal. Medial compartment joint space narrowing and spur formation. Mild patellofemoral joint space narrowing. No fracture, dislocation, or bone destruction. No joint effusion. IMPRESSION: Degenerative changes RIGHT knee primarily at medial compartment. No acute abnormalities. Electronically Signed   By: Ulyses Southward M.D.   On: 04/18/2021 13:59    No results found.  DG Chest 2 View  Result Date: 04/17/2021 CLINICAL DATA:  55 year old female with history of pneumonia. EXAM: CHEST - 2 VIEW COMPARISON:  Chest x-ray 03/28/2021. FINDINGS: Lung volumes are normal. No consolidative airspace disease. No pleural effusions. No pneumothorax. No pulmonary nodule or mass noted. Pulmonary  vasculature and the cardiomediastinal silhouette are within normal limits. IMPRESSION: No radiographic evidence of acute cardiopulmonary disease. Electronically Signed   By: Trudie Reed M.D.   On: 04/17/2021 13:49   DG Chest 2 View  Result Date: 03/28/2021 CLINICAL DATA:  Chest pain EXAM: CHEST - 2 VIEW COMPARISON:  02/01/2012 FINDINGS: Mild cardiac enlargement. No pleural effusion. Pulmonary vascular congestion. No airspace densities. Visualized osseous structures are unremarkable. IMPRESSION: Cardiac enlargement and pulmonary vascular congestion. Electronically Signed   By: Signa Kell M.D.   On: 03/28/2021 14:13   CT Angio Chest PE W and/or Wo Contrast  Result Date: 03/28/2021 CLINICAL DATA:  Positive D-dimer.  Concern for pulmonary embolism. EXAM: CT ANGIOGRAPHY CHEST WITH CONTRAST TECHNIQUE: Multidetector CT imaging of the chest was performed using the standard protocol during bolus administration of intravenous contrast. Multiplanar CT image reconstructions and MIPs were obtained to evaluate the vascular anatomy. CONTRAST:  12mL OMNIPAQUE IOHEXOL 350 MG/ML SOLN COMPARISON:  Chest radiograph dated 03/28/2021. FINDINGS: Cardiovascular: Mild cardiomegaly. No pericardial effusion. There is retrograde flow of contrast from the right atrium into the IVC. The thoracic aorta is grossly unremarkable for the degree of opacification. Evaluation of the pulmonary arteries is limited due to respiratory motion. No pulmonary artery embolus identified. Mediastinum/Nodes: Mildly enlarged right hilar and subcarinal lymph nodes. Right hilar lymph nodes measure 15 mm short axis and subcarinal lymph nodes measure 18 mm short axis. There is apparent faint calcification, likely representing partially calcified granuloma. The esophagus is grossly unremarkable. No mediastinal fluid collection. Lungs/Pleura: There is diffuse hazy airspace density throughout the lungs. There is diffuse interstitial and interlobular septal  prominence as well as scattered small clusters of ground-glass density. Findings may represent edema, although atypical pneumonia is not excluded clinical correlation is recommended. No pleural effusion or pneumothorax. The central airways are patent. Upper Abdomen: No acute abnormality. Musculoskeletal: Degenerative changes of the spine. No acute osseous pathology. Review of the MIP images confirms the above findings. IMPRESSION: 1. No CT evidence of pulmonary embolism. 2. Diffuse hazy airspace density throughout the lungs may represent edema, although atypical pneumonia is not excluded. Clinical correlation is recommended. 3. Mildly enlarged right hilar and subcarinal lymph nodes, likely reactive. Electronically Signed   By: Elgie Collard M.D.   On: 03/28/2021 20:26   DG Knee Complete 4 Views Right  Result Date: 04/18/2021 CLINICAL DATA:  Knee pain EXAM: RIGHT KNEE - COMPLETE 4+ VIEW COMPARISON:  04/25/2014 FINDINGS: Osseous mineralization low normal. Medial compartment joint space narrowing and spur formation. Mild patellofemoral joint space narrowing. No fracture, dislocation, or bone destruction. No joint effusion. IMPRESSION: Degenerative changes RIGHT knee primarily at medial compartment. No acute abnormalities. Electronically Signed   By: Ulyses Southward M.D.   On: 04/18/2021 13:59      Assessment and Plan: Patient Active Problem List   Diagnosis Date Noted   Atypical pneumonia 04/16/2021   OA (osteoarthritis) of knee 05/02/2014   Essential hypertension, benign 04/13/2014   Right knee pain 04/13/2014   Tobacco use disorder 04/13/2014   Left pyosalpinx 02/04/2014   1. Obstructive chronic bronchitis without exacerbation (HCC) Likely COPD longstanding history of smoking.  We will get the pulmonary functions as ordered - Pulmonary function test; Future  2. Other hypersomnia Significant symptoms of sleep disordered breathing will get patient in for PSG   - PSG Sleep Study; Future  3.  GERD without esophagitis At baseline we will continue to follow  4. Obesity, morbid (HCC) Obesity Counseling: Had a lengthy discussion regarding patients BMI and weight issues. Patient was instructed on portion control as well as increased activity. Also discussed caloric restrictions with trying to maintain intake less than 2000 Kcal. Discussions were made in accordance with the 5As  of weight management. Simple actions such as not eating late and if able to, taking a walk is suggested.   5. Chronic pulmonary edema Needs to monitor fluid status closely the patient is being seen by her cardiologist Dr. Harl Bowie   General Counseling: I have discussed the findings of the evaluation and examination with [facility].  I have also discussed any further diagnostic evaluation thatmay be needed or ordered today. Chalyn verbalizes understanding of the findings of todays visit. We also reviewed her medications today and discussed drug interactions and side effects including but not limited excessive drowsiness and altered mental states. We also discussed that there is always a risk not just to her but also people around her. she has been encouraged to call the office with any questions or concerns that should arise related to todays visit.  No orders of the defined types were placed in this encounter.    Time spent: 12  I have personally obtained a history, examined the patient, evaluated laboratory and imaging results, formulated the assessment and plan and placed orders.    Yevonne Pax, MD [facility] Pulmonary and Critical Care Sleep medicine

## 2021-05-05 ENCOUNTER — Other Ambulatory Visit: Payer: Self-pay

## 2021-05-05 ENCOUNTER — Encounter: Payer: Self-pay | Admitting: Internal Medicine

## 2021-05-05 ENCOUNTER — Ambulatory Visit (INDEPENDENT_AMBULATORY_CARE_PROVIDER_SITE_OTHER): Payer: PRIVATE HEALTH INSURANCE | Admitting: Internal Medicine

## 2021-05-05 VITALS — BP 143/90 | HR 87 | Ht 64.0 in | Wt 195.9 lb

## 2021-05-05 DIAGNOSIS — M1711 Unilateral primary osteoarthritis, right knee: Secondary | ICD-10-CM

## 2021-05-05 DIAGNOSIS — F17200 Nicotine dependence, unspecified, uncomplicated: Secondary | ICD-10-CM

## 2021-05-05 DIAGNOSIS — E669 Obesity, unspecified: Secondary | ICD-10-CM

## 2021-05-05 DIAGNOSIS — I10 Essential (primary) hypertension: Secondary | ICD-10-CM

## 2021-05-05 NOTE — Assessment & Plan Note (Signed)

## 2021-05-05 NOTE — Assessment & Plan Note (Signed)
Arthritis is better patient was advised to lose weight

## 2021-05-05 NOTE — Assessment & Plan Note (Signed)
   Use a measuring cup to measure serving sizes. You could also try weighing out portions on a kitchen scale. With time, you will be able to estimate serving sizes for some foods.  Take time to put servings of different foods on your favorite plates or in your favorite bowls and cups so you know what a serving looks like.  Try not to eat straight from a food's packaging, such as from a bag or box. Eating straight from the package makes it hard to see how much you are eating and can lead to overeating. Put the amount you would like to eat in a cup or on a plate to make sure you are eating the right portion.  Use smaller plates, glasses, and bowls for smaller portions and to prevent overeating.  Try not to multitask. For example, avoid watching TV or using your computer while eating. If it is time to eat, sit down at a table and enjoy your food. This will help you recognize when you are full. It will also help you be more mindful of what and how much you are eating. What are tips for following this plan? Reading food labels  Check the calorie count compared with the serving size. The serving size may be smaller than what you are used to eating.  Check the source of the calories. Try to choose foods that are high in protein, fiber, and vitamins, and low in saturated fat, trans fat, and sodium. Shopping  Read nutrition labels while you shop. This will help you make healthy decisions about which foods to buy.  Pay attention to nutrition labels for low-fat or fat-free foods. These foods sometimes have the same number of calories or more calories than the full-fat versions. They also often have added sugar, starch, or salt to make up for flavor that was removed with the fat.  Make a grocery list of lower-calorie foods and stick to it. Cooking  Try to cook your favorite foods in a healthier way. For example, try baking instead of frying.  Use low-fat dairy products. Meal planning  Use more  fruits and vegetables. One-half of your plate should be fruits and vegetables.  Include lean proteins, such as chicken, turkey, and fish. Lifestyle Each week, aim to do one of the following:  150 minutes of moderate exercise, such as walking.  75 minutes of vigorous exercise, such as running. General information  Know how many calories are in the foods you eat most often. This will help you calculate calorie counts faster.  Find a way of tracking calories that works for you. Get creative. Try different apps or programs if writing down calories does not work for you. 

## 2021-05-05 NOTE — Assessment & Plan Note (Signed)

## 2021-05-05 NOTE — Progress Notes (Signed)
Established Patient Office Visit  Subjective:  Patient ID: Kathleen [name], female    DOB: 1966-07-19  Age: 55 y.o. MRN: [id number]  CC:  Chief Complaint  Patient presents with   Hypertension    Hypertension   Christin Bach presents for bp check  Past Medical History:  Diagnosis Date   Hypertension     Past Surgical History:  Procedure Laterality Date   CESAREAN SECTION     3 cesarean sections    Family History  Problem Relation Age of Onset   Cancer Mother        Female   Hypertension Mother     Social History   Socioeconomic History   Marital status: Married    Spouse name: Not on file   Number of children: Not on file   Years of education: Not on file   Highest education level: Not on file  Occupational History   Not on file  Tobacco Use   Smoking status: Every Day    Packs/day: 0.50    Types: Cigarettes   Smokeless tobacco: Never  Substance and Sexual Activity   Alcohol use: No   Drug use: No   Sexual activity: Yes    Birth control/protection: None  Other Topics Concern   Not on file  Social History Narrative   Not on file   Social Determinants of Health   Financial Resource Strain: Not on file  Food Insecurity: Not on file  Transportation Needs: Not on file  Physical Activity: Not on file  Stress: Not on file  Social Connections: Not on file  Intimate Partner Violence: Not on file     Current Outpatient Medications:    aspirin EC 81 MG tablet, Take 1 tablet (81 mg total) by mouth daily. Swallow whole., Disp: 30 tablet, Rfl: 11   carvedilol (COREG) 6.25 MG tablet, Take 6.25 mg by mouth 2 (two) times daily with a meal., Disp: , Rfl:    Cyanocobalamin (VITAMIN B12 PO), Take by mouth., Disp: , Rfl:    furosemide (LASIX) 40 MG tablet, Take 1 tablet (40 mg total) by mouth daily., Disp: 30 tablet, Rfl: 0   ibuprofen (ADVIL,MOTRIN) 600 MG tablet, Take 1 tablet (600 mg total) by mouth every 6 (six) hours as needed (mild pain)., Disp: 30  tablet, Rfl: 0   losartan (COZAAR) 50 MG tablet, Take 1 tablet (50 mg total) by mouth daily., Disp: 30 tablet, Rfl: 0   Multiple Vitamins-Minerals (ONE-A-DAY ENERGY PO), Take 1 tablet by mouth daily., Disp: , Rfl:    spironolactone (ALDACTONE) 25 MG tablet, Take 25 mg by mouth daily., Disp: , Rfl:    Allergies  Allergen Reactions   Bee Venom Anaphylaxis and Swelling   Penicillins     ROS Review of Systems  Constitutional: Negative.   HENT: Negative.    Eyes: Negative.   Respiratory: Negative.    Cardiovascular: Negative.   Gastrointestinal: Negative.   Endocrine: Negative.   Genitourinary: Negative.   Musculoskeletal: Negative.   Skin: Negative.   Allergic/Immunologic: Negative.   Neurological: Negative.   Hematological: Negative.   Psychiatric/Behavioral: Negative.    All other systems reviewed and are negative.    Objective:    Physical Exam Vitals reviewed.  Constitutional:      Appearance: Normal appearance.  HENT:     Mouth/Throat:     Mouth: Mucous membranes are moist.  Eyes:     Pupils: Pupils are equal, round, and reactive to light.  Neck:  Vascular: No carotid bruit.  Cardiovascular:     Rate and Rhythm: Normal rate and regular rhythm.     Pulses: Normal pulses.     Heart sounds: Normal heart sounds.  Pulmonary:     Effort: Pulmonary effort is normal.     Breath sounds: Normal breath sounds.  Abdominal:     General: Bowel sounds are normal.     Palpations: Abdomen is soft. There is no hepatomegaly, splenomegaly or mass.     Tenderness: There is no abdominal tenderness.     Hernia: No hernia is present.  Musculoskeletal:        General: No tenderness.     Cervical back: Neck supple.     Right lower leg: No edema.     Left lower leg: No edema.  Skin:    Findings: No rash.  Neurological:     Mental Status: She is alert and oriented to person, place, and time.     Motor: No weakness.  Psychiatric:        Mood and Affect: Mood and affect  normal.        Behavior: Behavior normal.    BP (!) 143/90   Pulse 87   Ht 5\' 4"  (1.626 m)   Wt 195 lb 14.4 oz (88.9 kg)   LMP 01/02/2016   BMI 33.63 kg/m  Wt Readings from Last 3 Encounters:  05/05/21 195 lb 14.4 oz (88.9 kg)  04/24/21 193 lb 12.8 oz (87.9 kg)  04/21/21 194 lb (88 kg)     Health Maintenance Due  Topic Date Due   Hepatitis C Screening  Never done   TETANUS/TDAP  Never done   PAP SMEAR-Modifier  Never done   COLONOSCOPY (Pts 45-59yrs Insurance coverage will need to be confirmed)  Never done   MAMMOGRAM  Never done    There are no preventive care reminders to display for this patient.  Lab Results  Component Value Date   TSH 0.858 04/13/2014   Lab Results  Component Value Date   WBC 6.5 03/28/2021   HGB 12.7 03/28/2021   HCT 38.1 03/28/2021   MCV 87.8 03/28/2021   PLT 287 03/28/2021   Lab Results  Component Value Date   NA 141 04/21/2021   K 4.2 04/21/2021   CO2 25 04/21/2021   GLUCOSE 103 (H) 03/28/2021   BUN 18 03/28/2021   CREATININE 0.75 03/28/2021   BILITOT 0.3 04/13/2014   ALKPHOS 77 04/13/2014   AST 15 04/13/2014   ALT 14 04/13/2014   PROT 7.3 04/13/2014   ALBUMIN 4.1 04/13/2014   CALCIUM 9.9 03/28/2021   ANIONGAP 10 03/28/2021   Lab Results  Component Value Date   CHOL 194 04/13/2014   Lab Results  Component Value Date   HDL 49 04/13/2014   Lab Results  Component Value Date   LDLCALC 128 (H) 04/13/2014   Lab Results  Component Value Date   TRIG 84 04/13/2014   Lab Results  Component Value Date   CHOLHDL 4.0 04/13/2014   No results found for: HGBA1C    Assessment & Plan:   Problem List Items Addressed This Visit       Cardiovascular and Mediastinum   Essential hypertension, benign - Primary    The following hypertensive lifestyle modification were recommended and discussed:  1. Limiting alcohol intake to less than 1 oz/day of ethanol:(24 oz of beer or 8 oz of wine or 2 oz of 100-proof whiskey). 2. Take  baby ASA 81 mg  daily. 3. Importance of regular aerobic exercise and losing weight. 4. Reduce dietary saturated fat and cholesterol intake for overall cardiovascular health. 5. Maintaining adequate dietary potassium, calcium, and magnesium intake. 6. Regular monitoring of the blood pressure. 7. Reduce sodium intake to less than 100 mmol/day (less than 2.3 gm of sodium or less than 6 gm of sodium choride)         Musculoskeletal and Integument   OA (osteoarthritis) of knee    Arthritis is better patient was advised to lose weight        Other   Tobacco use disorder    Counseled patient on the dangers of tobacco use, advised patient to stop smoking, and reviewed strategies to maximize success Smoking cessation instruction/counseling given:  counseled patient on the dangers of tobacco use, advised patient to stop smoking, and reviewed strategies to maximize success It is very important that pt quit smoking. There are various alternatives available to help with this difficult task, but first and foremost, pt must make a firm commitment and decision to quit. The nature of nicotine addiction is discussed. The usefulness of behavioral therapy is discussed and suggested.  The correct use, cost and side effects of nicotine replacement therapy such as gum or patches is discussed. Bupropion and its cost (sometimes not covered fully by insurance) and side effects are reviewed. The quit rates are discussed. I recommend pt not allow potential costs of treatment to deter ptfrom using nicotine replacement therapy or bupropion, as the long term economic and health benefits are obvious.       Obesity (BMI 30-39.9)        Use a measuring cup to measure serving sizes. You could also try weighing out portions on a kitchen scale. With time, you will be able to estimate serving sizes for some foods.  Take time to put servings of different foods on your favorite plates or in your favorite bowls and cups so you know  what a serving looks like.  Try not to eat straight from a food's packaging, such as from a bag or box. Eating straight from the package makes it hard to see how much you are eating and can lead to overeating. Put the amount you would like to eat in a cup or on a plate to make sure you are eating the right portion.  Use smaller plates, glasses, and bowls for smaller portions and to prevent overeating.  Try not to multitask. For example, avoid watching TV or using your computer while eating. If it is time to eat, sit down at a table and enjoy your food. This will help you recognize when you are full. It will also help you be more mindful of what and how much you are eating. What are tips for following this plan? Reading food labels  Check the calorie count compared with the serving size. The serving size may be smaller than what you are used to eating.  Check the source of the calories. Try to choose foods that are high in protein, fiber, and vitamins, and low in saturated fat, trans fat, and sodium. Shopping  Read nutrition labels while you shop. This will help you make healthy decisions about which foods to buy.  Pay attention to nutrition labels for low-fat or fat-free foods. These foods sometimes have the same number of calories or more calories than the full-fat versions. They also often have added sugar, starch, or salt to make up for flavor that was removed with the  fat.  Make a grocery list of lower-calorie foods and stick to it. Cooking  Try to cook your favorite foods in a healthier way. For example, try baking instead of frying.  Use low-fat dairy products. Meal planning  Use more fruits and vegetables. One-half of your plate should be fruits and vegetables.  Include lean proteins, such as chicken, [place], and fish. Lifestyle Each week, aim to do one of the following:  150 minutes of moderate exercise, such as walking.  75 minutes of vigorous exercise, such as  running. General information  Know how many calories are in the foods you eat most often. This will help you calculate calorie counts faster.  Find a way of tracking calories that works for you. Get creative. Try different apps or programs if writing down calories does not work for you.       No orders of the defined types were placed in this encounter.   Follow-up: No follow-ups on file.    Corky Downs, MD

## 2021-05-13 ENCOUNTER — Telehealth: Payer: Self-pay

## 2021-05-13 NOTE — Telephone Encounter (Signed)
Patient has been scheduled a PSG with FG for Sunday May 18, 2021.

## 2021-05-21 ENCOUNTER — Other Ambulatory Visit: Payer: Self-pay

## 2021-05-21 ENCOUNTER — Ambulatory Visit (INDEPENDENT_AMBULATORY_CARE_PROVIDER_SITE_OTHER): Payer: PRIVATE HEALTH INSURANCE | Admitting: Internal Medicine

## 2021-05-21 DIAGNOSIS — R0602 Shortness of breath: Secondary | ICD-10-CM

## 2021-05-21 DIAGNOSIS — J449 Chronic obstructive pulmonary disease, unspecified: Secondary | ICD-10-CM

## 2021-05-21 LAB — PULMONARY FUNCTION TEST

## 2021-05-25 NOTE — Procedures (Signed)
[facility] [address] [place], [address]    Complete Pulmonary Function Testing Interpretation:  FINDINGS:  Forced vital capacity is mildly decreased.  FEV1 is 1.5 L which is 69% of predicted and is mildly decreased.  FEV1 FVC ratio is normal.  Postbronchodilator no significant change in FEV1.  Total lung capacity is mildly decreased.  Residual volume is normal.  Residual volume total lung capacity ratio is increased.  FRC is decreased.  The DLCO is within normal limits  IMPRESSION:  This pulmonary function study is suggestive of mild restrictive lung disease  Yevonne Pax, MD [facility] Pulmonary Critical Care Medicine Sleep Medicine

## 2021-05-31 ENCOUNTER — Encounter (INDEPENDENT_AMBULATORY_CARE_PROVIDER_SITE_OTHER): Payer: PRIVATE HEALTH INSURANCE | Admitting: Internal Medicine

## 2021-05-31 DIAGNOSIS — G4733 Obstructive sleep apnea (adult) (pediatric): Secondary | ICD-10-CM | POA: Diagnosis not present

## 2021-06-02 ENCOUNTER — Encounter: Payer: Self-pay | Admitting: Internal Medicine

## 2021-06-02 ENCOUNTER — Ambulatory Visit: Payer: PRIVATE HEALTH INSURANCE | Admitting: Internal Medicine

## 2021-06-02 ENCOUNTER — Other Ambulatory Visit: Payer: Self-pay

## 2021-06-02 VITALS — BP 140/72 | HR 88 | Temp 98.0°F | Resp 16 | Ht 64.0 in | Wt 199.0 lb

## 2021-06-02 DIAGNOSIS — J449 Chronic obstructive pulmonary disease, unspecified: Secondary | ICD-10-CM | POA: Diagnosis not present

## 2021-06-02 DIAGNOSIS — G4719 Other hypersomnia: Secondary | ICD-10-CM | POA: Diagnosis not present

## 2021-06-02 DIAGNOSIS — K219 Gastro-esophageal reflux disease without esophagitis: Secondary | ICD-10-CM | POA: Diagnosis not present

## 2021-06-02 NOTE — Progress Notes (Signed)
[facility] [address] [place], [place] [address]  Pulmonary Sleep Medicine   Office Visit Note  Patient Name: Kathleen [name] DOB: [birth year] MRN [id number]  Date of Service: 06/02/2021  Complaints/HPI: Smoker PFT Follow Up. OSA follow up.  Patient had a sleep study done on the 12th and this sleep study shows that she has significant obstructive sleep apnea with worsening during REM sleep.The baseline apnea-hypopnea index was 9.9 and the REM apnea-hypopnea index was 20.2 a CPAP titration study therefore would be recommended.  As far as her smoking is concerned she does continue to smoke smoking cessation counseling was also provided at this time.  I also did review her pulmonary function studies which showed an FEV1 that was decreased down to 69% she had restrictive lung pattern which is likely related to her weight however she probably has an underlying obstructive component also  ROS  General: (-) fever, (-) chills, (-) night sweats, (-) weakness Skin: (-) rashes, (-) itching,. Eyes: (-) visual changes, (-) redness, (-) itching. Nose and Sinuses: (-) nasal stuffiness or itchiness, (-) postnasal drip, (-) nosebleeds, (-) sinus trouble. Mouth and Throat: (-) sore throat, (-) hoarseness. Neck: (-) swollen glands, (-) enlarged thyroid, (-) neck pain. Respiratory: - cough, (-) bloody sputum, - shortness of breath, - wheezing. Cardiovascular: - ankle swelling, (-) chest pain. Lymphatic: (-) lymph node enlargement. Neurologic: (-) numbness, (-) tingling. Psychiatric: (-) anxiety, (-) depression   Current Medication: Outpatient Encounter Medications as of 06/02/2021  Medication Sig   aspirin EC 81 MG tablet Take 1 tablet (81 mg total) by mouth daily. Swallow whole.   carvedilol (COREG) 6.25 MG tablet Take 6.25 mg by mouth 2 (two) times daily with a meal.   Cyanocobalamin (VITAMIN B12 PO) Take by mouth.   ibuprofen (ADVIL,MOTRIN) 600 MG tablet Take 1 tablet (600 mg  total) by mouth every 6 (six) hours as needed (mild pain).   losartan (COZAAR) 50 MG tablet Take 1 tablet (50 mg total) by mouth daily.   Multiple Vitamins-Minerals (ONE-A-DAY ENERGY PO) Take 1 tablet by mouth daily.   spironolactone (ALDACTONE) 25 MG tablet Take 25 mg by mouth daily.   [DISCONTINUED] furosemide (LASIX) 40 MG tablet Take 1 tablet (40 mg total) by mouth daily. (Patient not taking: Reported on 06/02/2021)   No facility-administered encounter medications on file as of 06/02/2021.    Surgical History: Past Surgical History:  Procedure Laterality Date   CESAREAN SECTION     3 cesarean sections    Medical History: Past Medical History:  Diagnosis Date   Hypertension     Family History: Family History  Problem Relation Age of Onset   Cancer Mother        Female   Hypertension Mother     Social History: Social History   Socioeconomic History   Marital status: Married    Spouse name: Not on file   Number of children: Not on file   Years of education: Not on file   Highest education level: Not on file  Occupational History   Not on file  Tobacco Use   Smoking status: Every Day    Packs/day: 0.50    Types: Cigarettes   Smokeless tobacco: Never  Substance and Sexual Activity   Alcohol use: No   Drug use: No   Sexual activity: Yes    Birth control/protection: None  Other Topics Concern   Not on file  Social History Narrative   Not on file   Social  Determinants of Health   Financial Resource Strain: Not on file  Food Insecurity: Not on file  Transportation Needs: Not on file  Physical Activity: Not on file  Stress: Not on file  Social Connections: Not on file  Intimate Partner Violence: Not on file    Vital Signs: Blood pressure 140/72, pulse 88, temperature 98 F (36.7 C), resp. rate 16, height 5\' 4"  (1.626 m), weight 199 lb (90.3 kg), last menstrual period 01/02/2016, SpO2 100 %.  Examination: General Appearance: The patient is  well-developed, well-nourished, and in no distress. Skin: Gross inspection of skin unremarkable. Head: normocephalic, no gross deformities. Eyes: no gross deformities noted. ENT: ears appear grossly normal no exudates. Neck: Supple. No thyromegaly. No LAD. Respiratory: no rhonchi noted. Cardiovascular: Normal S1 and S2 without murmur or rub. Extremities: No cyanosis. pulses are equal. Neurologic: Alert and oriented. No involuntary movements.  LABS: Recent Results (from the past 2160 hour(s))  CBC     Status: None   Collection Time: 03/28/21  1:39 PM  Result Value Ref Range   WBC 6.5 4.0 - 10.5 K/uL   RBC 4.34 3.87 - 5.11 MIL/uL   Hemoglobin 12.7 12.0 - 15.0 g/dL   HCT 05/28/21 12.4 - 58.0 %   MCV 87.8 80.0 - 100.0 fL   MCH 29.3 26.0 - 34.0 pg   MCHC 33.3 30.0 - 36.0 g/dL   RDW 99.8 33.8 - 25.0 %   Platelets 287 150 - 400 K/uL   nRBC 0.0 0.0 - 0.2 %    Comment: Performed at [facility], [address], [place], [place]  Pregnancy, urine     Status: None   Collection Time: 03/28/21  4:45 PM  Result Value Ref Range   Preg Test, Ur NEGATIVE NEGATIVE    Comment:        THE SENSITIVITY OF THIS METHODOLOGY IS >20 mIU/mL. Performed at 05/28/21, [address], [place], [place]   Basic metabolic panel     Status: Abnormal   Collection Time: 03/28/21  5:34 PM  Result Value Ref Range   Sodium 139 135 - 145 mmol/L   Potassium 3.6 3.5 - 5.1 mmol/L   Chloride 107 98 - 111 mmol/L   CO2 22 22 - 32 mmol/L   Glucose, Bld 103 (H) 70 - 99 mg/dL    Comment: Glucose reference range applies only to samples taken after fasting for at least 8 hours.   BUN 18 6 - 20 mg/dL   Creatinine, Ser 05/28/21 0.44 - 1.00 mg/dL   Calcium 9.9 8.9 - 0.24 mg/dL   GFR, Estimated 09.7 >35 mL/min    Comment: (NOTE) Calculated using the CKD-EPI Creatinine Equation (2021)    Anion gap 10 5 - 15    Comment: Performed at 07-15-1972, [address] [place], [place]  Troponin I (High Sensitivity)     Status: Abnormal   Collection Time: 03/28/21  5:34 PM  Result Value Ref Range   Troponin I (High Sensitivity) 154 (HH) <18 ng/L    Comment: CRITICAL RESULT CALLED TO, READ BACK BY AND VERIFIED WITH: M ROBERTS 03/28/21 @ 1845 MTV (NOTE) Elevated high sensitivity troponin I (hsTnI) values and significant  changes across serial measurements may suggest ACS but many other  chronic and acute conditions are known to elevate hsTnI results.  Refer to the Links section for chest pain algorithms and additional  guidance. Performed at 05/28/21, [id number]  [place], [place] [address]   Brain natriuretic peptide     Status: Abnormal   Collection Time: 03/28/21  5:34 PM  Result Value Ref Range   B Natriuretic Peptide 605.3 (H) 0.0 - 100.0 pg/mL    Comment: Performed at [facility], [address], [place], [place] [address]  D-dimer, quantitative     Status: Abnormal   Collection Time: 03/28/21  5:34 PM  Result Value Ref Range   D-Dimer, Quant 0.94 (H) 0.00 - 0.50 ug/mL-FEU    Comment: (NOTE) At the manufacturer cut-off value of 0.5 g/mL FEU, this assay has a negative predictive value of 95-100%.This assay is intended for use in conjunction with a clinical pretest probability (PTP) assessment model to exclude pulmonary embolism (PE) and deep venous thrombosis (DVT) in outpatients suspected of PE or DVT. Results should be correlated with clinical presentation. Performed at [facility], [address], [place], [place] [address]   Troponin I (High Sensitivity)     Status: Abnormal   Collection Time: 03/28/21  7:35 PM  Result Value Ref Range   Troponin I (High Sensitivity) 124 (HH) <18 ng/L    Comment: CRITICAL VALUE NOTED.  VALUE IS CONSISTENT WITH PREVIOUSLY REPORTED AND CALLED VALUE. (NOTE) Elevated high sensitivity troponin I (hsTnI) values and  significant  changes across serial measurements may suggest ACS but many other  chronic and acute conditions are known to elevate hsTnI results.  Refer to the Links section for chest pain algorithms and additional  guidance. Performed at [facility], [address] [place], [place] [address]   Electrolyte panel     Status: None   Collection Time: 04/21/21 11:21 AM  Result Value Ref Range   Sodium 141 135 - 146 mmol/L   Potassium 4.2 3.5 - 5.3 mmol/L   Chloride 104 98 - 110 mmol/L   CO2 25 20 - 32 mmol/L    Radiology: DG Chest 2 View  Result Date: 04/17/2021 CLINICAL DATA:  55 year old female with history of pneumonia. EXAM: CHEST - 2 VIEW COMPARISON:  Chest x-ray 03/28/2021. FINDINGS: Lung volumes are normal. No consolidative airspace disease. No pleural effusions. No pneumothorax. No pulmonary nodule or mass noted. Pulmonary vasculature and the cardiomediastinal silhouette are within normal limits. IMPRESSION: No radiographic evidence of acute cardiopulmonary disease. Electronically Signed   By: Trudie Reed M.D.   On: 04/17/2021 13:49   DG Knee Complete 4 Views Right  Result Date: 04/18/2021 CLINICAL DATA:  Knee pain EXAM: RIGHT KNEE - COMPLETE 4+ VIEW COMPARISON:  04/25/2014 FINDINGS: Osseous mineralization low normal. Medial compartment joint space narrowing and spur formation. Mild patellofemoral joint space narrowing. No fracture, dislocation, or bone destruction. No joint effusion. IMPRESSION: Degenerative changes RIGHT knee primarily at medial compartment. No acute abnormalities. Electronically Signed   By: Ulyses Southward M.D.   On: 04/18/2021 13:59    No results found.  No results found.    Assessment and Plan: Patient Active Problem List   Diagnosis Date Noted   Obesity (BMI 30-39.9) 05/05/2021   Atypical pneumonia 04/16/2021   OA (osteoarthritis) of knee 05/02/2014   Essential hypertension, benign 04/13/2014   Right knee pain 04/13/2014    Tobacco use disorder 04/13/2014   Left pyosalpinx 02/04/2014    1. Obstructive chronic bronchitis without exacerbation (HCC) Smoking cessation counseling once again provided strongly encouraged to stop smoking she does have a heavy smoking history in addition to that her pulmonary functions were abnormal  2. Obesity, morbid (HCC) Obesity Counseling:  Had a lengthy discussion regarding patients BMI and weight issues. Patient was instructed on portion control as well as increased activity. Also discussed caloric restrictions with trying to maintain intake less than 2000 Kcal. Discussions were made in accordance with the 5As of weight management. Simple actions such as not eating late and if able to, taking a walk is suggested.   3. Other hypersomnia She has OSA with an AHI 9.9 and REM associated AHI of 20 per hour. CPAP is recommended. She is not interested in going for a CPAP titration. Spoke to her about risks of OSA but she states that she wants to lose weight. Explained to her that she needs to lose weight if she does not I would strongly recommend CPAP. She will check back in 6 months  4. GERD without esophagitis Reflux prevention PPI use all discussed with her along with weight loss  General Counseling: I have discussed the findings of the evaluation and examination with Kathleen [name].  I have also discussed any further diagnostic evaluation thatmay be needed or ordered today. Kathleen [name] verbalizes understanding of the findings of todays visit. We also reviewed her medications today and discussed drug interactions and side effects including but not limited excessive drowsiness and altered mental states. We also discussed that there is always a risk not just to her but also people around her. she has been encouraged to call the office with any questions or concerns that should arise related to todays visit.  No orders of the defined types were placed in this encounter.    Time spent: 5  I have  personally obtained a history, examined the patient, evaluated laboratory and imaging results, formulated the assessment and plan and placed orders.    Yevonne Pax, MD [facility] Pulmonary and Critical Care Sleep medicine

## 2021-06-02 NOTE — Patient Instructions (Signed)
Sleep Apnea ?Sleep apnea affects breathing during sleep. It causes breathing to stop for 10 seconds or more, or to become shallow. People with sleep apnea usually snore loudly. ?It can also increase the risk of: ?Heart attack. ?Stroke. ?Being very overweight (obese). ?Diabetes. ?Heart failure. ?Irregular heartbeat. ?High blood pressure. ?The goal of treatment is to help you breathe normally again. ?What are the causes? ?The most common cause of this condition is a collapsed or blocked airway. ?There are three kinds of sleep apnea: ?Obstructive sleep apnea. This is caused by a blocked or collapsed airway. ?Central sleep apnea. This happens when the brain does not send the right signals to the muscles that control breathing. ?Mixed sleep apnea. This is a combination of obstructive and central sleep apnea. ?What increases the risk? ?Being overweight. ?Smoking. ?Having a small airway. ?Being older. ?Being female. ?Drinking alcohol. ?Taking medicines to calm yourself (sedatives or tranquilizers). ?Having family members with the condition. ?Having a tongue or tonsils that are larger than normal. ?What are the signs or symptoms? ?Trouble staying asleep. ?Loud snoring. ?Headaches in the morning. ?Waking up gasping. ?Dry mouth or sore throat in the morning. ?Being sleepy or tired during the day. ?If you are sleepy or tired during the day, you may also: ?Not be able to focus your mind (concentrate). ?Forget things. ?Get angry a lot and have mood swings. ?Feel sad (depressed). ?Have changes in your personality. ?Have less interest in sex, if you are female. ?Be unable to have an erection, if you are female. ?How is this treated? ? ?Sleeping on your side. ?Using a medicine to get rid of mucus in your nose (decongestant). ?Avoiding the use of alcohol, medicines to help you relax, or certain pain medicines (narcotics). ?Losing weight, if needed. ?Changing your diet. ?Quitting smoking. ?Using a machine to open your airway while you  sleep, such as: ?An oral appliance. This is a mouthpiece that shifts your lower jaw forward. ?A CPAP device. This device blows air through a mask when you breathe out (exhale). ?An EPAP device. This has valves that you put in each nostril. ?A BIPAP device. This device blows air through a mask when you breathe in (inhale) and breathe out. ?Having surgery if other treatments do not work. ?Follow these instructions at home: ?Lifestyle ?Make changes that your doctor recommends. ?Eat a healthy diet. ?Lose weight if needed. ?Avoid alcohol, medicines to help you relax, and some pain medicines. ?Do not smoke or use any products that contain nicotine or tobacco. If you need help quitting, ask your doctor. ?General instructions ?Take over-the-counter and prescription medicines only as told by your doctor. ?If you were given a machine to use while you sleep, use it only as told by your doctor. ?If you are having surgery, make sure to tell your doctor you have sleep apnea. You may need to bring your device with you. ?Keep all follow-up visits. ?Contact a doctor if: ?The machine that you were given to use during sleep bothers you or does not seem to be working. ?You do not get better. ?You get worse. ?Get help right away if: ?Your chest hurts. ?You have trouble breathing in enough air. ?You have an uncomfortable feeling in your back, arms, or stomach. ?You have trouble talking. ?One side of your body feels weak. ?A part of your face is hanging down. ?These symptoms may be an emergency. Get help right away. Call your local emergency services (911 in the [place] ?Do not wait to see if the symptoms   will go away. ?Do not drive yourself to the hospital. ?Summary ?This condition affects breathing during sleep. ?The most common cause is a collapsed or blocked airway. ?The goal of treatment is to help you breathe normally while you sleep. ?This information is not intended to replace advice given to you by your health care provider. Make  sure you discuss any questions you have with your health care provider. ?Document Revised: 02/12/2021 Document Reviewed: 06/14/2020 ?Elsevier Patient Education ? 2022 [employer] Inc[employer] ? ?

## 2021-06-06 DIAGNOSIS — G4733 Obstructive sleep apnea (adult) (pediatric): Secondary | ICD-10-CM | POA: Insufficient documentation

## 2021-06-06 NOTE — Procedures (Signed)
[facility]  Polysomnogram Report Part I                                                                 Phone: [phone] Fax: [phone]  Patient Name: Kathleen [name], Kathleen [name] Acquisition Number: [id number]  Date of Birth: 05-18-1966 Acquisition Date: 05/31/2021  Referring Physician: Yevonne Pax, MD     History: The patient is a 55 year old female who was referred for evaluation of possible sleep apnea with snoring and sleepiness. Medical History: osteoarthritis, atypical pneumonia, benign essential hypertension.  Medications: aspirin 81, Coreg, vitamin B12, Lasix, Advil, Motrin, Cozaar, aldactone.  Procedure: This routine overnight polysomnogram was performed on the [facility] 5 using the standard diagnostic protocol. This included 6 channels of EEG, 2 channels of EOG, chin EMG, bilateral anterior tibialis EMG, nasal/oral thermistor, PTAF (nasal pressure transducer), chest and abdominal wall movements, EKG, and  pulse oximetry.   Description: The total recording time was 395.0 minutes. The total sleep time was 352.0 minutes. There were a total of 42.8 minutes of wakefulness after sleep onset for a slightly reducedsleep efficiency of 89.1%. The latency to sleep onset was shortat 0.2 minutes. The R sleep onset latency was very short at 15.0 minutes. Sleep parameters, as a percentage of the total sleep time, demonstrated 8.2% of sleep was in N1 sleep, 38.4% N2, 16.3% N3 and 37.1% R sleep. There were a total of 159 arousals for an arousal index of 27.1 arousals per hour of sleep that was elevated.  Respiratory monitoring demonstrated frequent moderate to severe degree of snoring. Less than 4 minutes of non-supine sleep was observed. There were 58 apneas and hypopneas for an Apnea Hypopnea Index of 9.9 apneas and hypopneas per hour of sleep. The REM related apnea hypopnea index was 20.2/hr of REM sleep compared to a NREM AHI of 3.5/hr.  The average duration of the respiratory events was 31.4  seconds with a maximum duration of 78.0 seconds. The respiratory events were associated with peripheral oxygen desaturations on the average to 90%. The lowest oxygen desaturation associated with a respiratory event was 84%. Additionally, the baseline oxygen saturation during wakefulness was 98%, during NREM sleep averaged 97%, and during REM sleep averaged 96%. The total duration of oxygen < 90% was 3.6 minutes.  Cardiac monitoring- did not demonstrate transient cardiac decelerations associated with the apneas. There were no significant cardiac rhythm irregularities.   Periodic limb movement monitoring- demonstrated that there were 324 periodic limb movements for a periodic limb movement index of 55.2 periodic limb movements per hour of sleep.      Impression: This routine overnight polysomnogram demonstrated significant, REM-related obstructive sleep apnea with an overall Apnea Hypopnea Index of 9.9 apneas and hypopneas per hour of sleep, which increased to 20.2 in REM sleep.  There was a significantly elevated periodic limb movement index of 55.2 periodic limb movements per hour of sleep. Sometimes these limb movements subside once the apnea is controlled. Clinical correlation is suggested.  There was a slightly reduced sleep efficiency with anelevated arousal index, and increased awakenings. REM latency was very short. This is likely due to the patient's limited sleep at home.  Recommendations:    A CPAP titration would be recommended for the sleep apnea. Some supine  sleep should be ensured to optimize the titration. Would recommend weight loss in a patient with a BMI of 33.3.  Alternative treatment options may include an oral appliance, a nasal resistance device, or ENT surgery in the appropriate clinical context.     Yevonne Pax, MD, [facility] Diplomate ABMS-Pulmonary, Critical Care and Sleep Medicine  Electronically reviewed and digitally signed   [facility] Polysomnogram  Report Part II  Phone: [phone] Fax: [phone]  Patient last name Kathleen [name] Neck Size 16.0 in. Acquisition [id number]  Patient first name Kathleen [name] Weight 194.0 lbs. Started 05/31/2021 at 10:46:39 PM  Birth date 12/04/1965 Height 64.0 in. Stopped 06/01/2021 at 5:34:33 AM  Age 60 BMI 33.3 lb/in2 Duration 395.0  Study Type Adult      Report generated by J.Coursey, RPSGT   Reviewed by: Valentino Hue. Henke, PhD, ABSM, FAASM Sleep Data: Lights Out: 10:51:57 PM Sleep Onset: 10:52:09 PM  Lights On: 5:26:57 AM Sleep Efficiency: 89.1 %  Total Recording Time: 395.0 min Sleep Latency (from Lights Off) 0.2 min  Total Sleep Time (TST): 352.0 min R Latency (from Sleep Onset): 15.0 min  Sleep Period Time: 394.0 min Total number of awakenings: 16  Wake during sleep: 42.0 min Wake After Sleep Onset (WASO): 42.8 min   Sleep Data:         Arousal Summary: Stage  Latency from lights out (min) Latency from sleep onset (min) Duration (min) % Total Sleep Time  Normal values  N 1 0.0 11.0 29.2 8.2 (5%)  N 2 0.2 0.0 135.0 38.4 (50%)  N 3 61.2 61.0 57.5 16.3 (20%)  R 15.2 15.0 130.5 37.1 (25%)    Number Index  Spontaneous 66 11.3  Apneas & Hypopneas 53 9.0  RERAs 0 0.0       (Apneas & Hypopneas & RERAs)  (53) (9.0)  Limb Movement 40 6.8  Snore 0 0.0  TOTAL 159 27.1     Respiratory Data:  CA OA MA Apnea Hypopnea* A+ H RERA Total  Number 1 0 4 5 53 58 0 58  Mean Dur (sec) 12.5 0.0 33.4 29.2 31.6 31.4 0.0 31.4  Max Dur (sec) 12.5 0.0 42.0 42.0 78.0 78.0 0.0 78.0  Total Dur (min) 0.2 0.0 2.2 2.4 27.9 30.4 0.0 30.4  % of TST 0.1 0.0 0.6 0.7 7.9 8.6 0.0 8.6  Index (#/h TST) 0.2 0.0 0.7 0.9 9.0 9.9 0.0 9.9  *Hypopneas scored based on 4% or greater desaturation.  Sleep Stage:        REM NREM TST  AHI 20.2 3.5 9.9  RDI 20.2 3.5 9.9           Body Position Data:  Sleep (min) TST (%) REM (min) NREM (min) CA (#) OA (#) MA (#) HYP (#) AHI (#/h) RERA (#) RDI (#/h) Desat (#)  Supine  348.7 99.06 130.5 218.2 1 0 4 53 10.0 0 10.0 57  Non-Supine 3.30 0.94 0.00 3.30 0.00 0.00 0.00 0.00 0.00 0 0.00 0.00  Left: 3.3 0.94 0.0 3.3 0 0 0 0 0.0 0 0.00 0  Right: 0.0 0.00 0.0 0.0 0 0 0 0 0.0 0 0.00 0  UP: 0.0 0.00 0.0 0.0 0 0 0 0 0.0 0 0.00 0     Snoring: Total number of snoring episodes  0  Total time with snoring    min (   % of sleep)   Oximetry Distribution:  WK REM NREM TOTAL  Average (%)   98 96 97 97  < 90% 0.0 3.6 0.0 3.6  < 80% 0.0 0.0 0.0 0.0  < 70% 0.0 0.0 0.0 0.0  # of Desaturations* 0 38 11 49  Desat Index (#/hour) 0.0 17.5 3.0 8.3  Desat Max (%) 0 16 13 16   Desat Max Dur (sec) 0.0 120.0 102.0 120.0  Approx Min O2 during sleep 84  Approx min O2 during a respiratory event 84  Was Oxygen added (Y/N) and final rate No:   0 LPM  *Desaturations based on 4% or greater drop from baseline.   Cheyne Stokes Breathing: None Present   Heart Rate Summary:  Average Heart Rate During Sleep 76.5 bpm      Highest Heart Rate During Sleep (95th %) 89.0 bpm      Highest Heart Rate During Sleep 164 bpm (artifact)  Highest Heart Rate During Recording (TIB) 224 bpm (artifact)   Heart Rate Observations: Event Type # Events   Bradycardia 0 Lowest HR Scored: N/A  Sinus Tachycardia During Sleep 0 Highest HR Scored: N/A  Narrow Complex Tachycardia 0 Highest HR Scored: N/A  Wide Complex Tachycardia 0 Highest HR Scored: N/A  Asystole 0 Longest Pause: N/A  Atrial Fibrillation 0 Duration Longest Event: N/A  Other Arrythmias  No Type:    Periodic Limb Movement Data: (Primary legs unless otherwise noted) Total # Limb Movement 340 Limb Movement Index 58.0  Total # PLMS 324 PLMS Index 55.2  Total # PLMS Arousals 25 PLMS Arousal Index 4.3  Percentage Sleep Time with PLMS 134.12min (38.1 % sleep)  Mean Duration limb movements (secs) 2013.5

## 2021-07-24 ENCOUNTER — Encounter: Payer: Self-pay | Admitting: Nurse Practitioner

## 2021-07-24 ENCOUNTER — Ambulatory Visit
Admission: RE | Admit: 2021-07-24 | Discharge: 2021-07-24 | Disposition: A | Discharge status: Home / Self Care | Payer: PRIVATE HEALTH INSURANCE | Source: Ambulatory Visit | Attending: Nurse Practitioner | Admitting: Nurse Practitioner

## 2021-07-24 ENCOUNTER — Ambulatory Visit: Payer: PRIVATE HEALTH INSURANCE | Admitting: Nurse Practitioner

## 2021-07-24 ENCOUNTER — Other Ambulatory Visit: Payer: Self-pay

## 2021-07-24 ENCOUNTER — Ambulatory Visit
Admission: RE | Admit: 2021-07-24 | Discharge: 2021-07-24 | Disposition: A | Discharge status: Home / Self Care | Payer: PRIVATE HEALTH INSURANCE | Attending: Nurse Practitioner | Admitting: Nurse Practitioner

## 2021-07-24 VITALS — BP 168/90 | HR 90 | Temp 98.2°F | Resp 16 | Ht 64.0 in | Wt 201.8 lb

## 2021-07-24 DIAGNOSIS — I509 Heart failure, unspecified: Secondary | ICD-10-CM

## 2021-07-24 DIAGNOSIS — N951 Menopausal and female climacteric states: Secondary | ICD-10-CM

## 2021-07-24 DIAGNOSIS — M5441 Lumbago with sciatica, right side: Secondary | ICD-10-CM | POA: Insufficient documentation

## 2021-07-24 DIAGNOSIS — R5383 Other fatigue: Secondary | ICD-10-CM

## 2021-07-24 DIAGNOSIS — D509 Iron deficiency anemia, unspecified: Secondary | ICD-10-CM

## 2021-07-24 DIAGNOSIS — R300 Dysuria: Secondary | ICD-10-CM

## 2021-07-24 DIAGNOSIS — R7303 Prediabetes: Secondary | ICD-10-CM | POA: Diagnosis not present

## 2021-07-24 DIAGNOSIS — R7301 Impaired fasting glucose: Secondary | ICD-10-CM

## 2021-07-24 DIAGNOSIS — J449 Chronic obstructive pulmonary disease, unspecified: Secondary | ICD-10-CM

## 2021-07-24 DIAGNOSIS — I10 Essential (primary) hypertension: Secondary | ICD-10-CM | POA: Diagnosis not present

## 2021-07-24 DIAGNOSIS — E559 Vitamin D deficiency, unspecified: Secondary | ICD-10-CM

## 2021-07-24 DIAGNOSIS — N898 Other specified noninflammatory disorders of vagina: Secondary | ICD-10-CM

## 2021-07-24 DIAGNOSIS — E538 Deficiency of other specified B group vitamins: Secondary | ICD-10-CM

## 2021-07-24 DIAGNOSIS — Z7689 Persons encountering health services in other specified circumstances: Secondary | ICD-10-CM

## 2021-07-24 DIAGNOSIS — E782 Mixed hyperlipidemia: Secondary | ICD-10-CM

## 2021-07-24 LAB — POCT URINALYSIS DIPSTICK
Bilirubin, UA: NEGATIVE
Blood, UA: NEGATIVE
Glucose, UA: NEGATIVE
Leukocytes, UA: NEGATIVE
Nitrite, UA: NEGATIVE
Protein, UA: POSITIVE — AB
Spec Grav, UA: >=1.03 — AB (ref 1.010–1.025)
Urobilinogen, UA: 0.2 E.U./dL
pH, UA: 5 (ref 5.0–8.0)

## 2021-07-24 LAB — POCT GLYCOSYLATED HEMOGLOBIN (HGB A1C): Hemoglobin A1C: 6.3 % — AB (ref 4.0–5.6)

## 2021-07-24 NOTE — Progress Notes (Signed)
[facility] [place],  [id number]  Internal MEDICINE  Office Visit Note  Patient Name: Kathleen [name]  [id number]  [id number]  Date of Service: 07/24/2021   Complaints/HPI Pt is here for establishment of PCP. Chief Complaint  Patient presents with   New Patient (Initial Visit)    Back pain worse this week, knee pain for over a year, discuss vaginal odor, not sexually active for 2 years   Fatigue   Quality Metric Gaps    Needs CPE   HPI Kathleen [name] presents for a new patient visit to establish care. She was already established with the clinic for pulmonology but is in need of a new PCP. She has congestive heart failure, hypertension and obstructive bronchitis.  Her A1C is 6.3 today which is prediabetic. Her blood pressure is poorly controlled. She was on lasix but this was discontinued in November 2022. She is overdue for labs. She has low back pain but has not had a baseline lumbar spine xray. She also has not had an echocardiogram since being diagnosed with congestive heart failure.  She has chronic arthritic knee pain.  She has recently noticed an odor to her vaginal discharge but she has not been sexually active for 2 years now.   Current Medication: Outpatient Encounter Medications as of 07/24/2021  Medication Sig   aspirin EC 81 MG tablet Take 1 tablet (81 mg total) by mouth daily. Swallow whole.   carvedilol (COREG) 6.25 MG tablet Take 6.25 mg by mouth 2 (two) times daily with a meal.   Cyanocobalamin (VITAMIN B12 PO) Take by mouth.   ibuprofen (ADVIL,MOTRIN) 600 MG tablet Take 1 tablet (600 mg total) by mouth every 6 (six) hours as needed (mild pain).   losartan (COZAAR) 50 MG tablet Take 1 tablet (50 mg total) by mouth daily.   Multiple Vitamins-Minerals (ONE-A-DAY ENERGY PO) Take 1 tablet by mouth daily.   spironolactone (ALDACTONE) 25 MG tablet Take 25 mg by mouth daily. Take 1/2 tablet daily (12.5 mg)   losartan (COZAAR) 100 MG tablet Take  100 mg by mouth daily.   No facility-administered encounter medications on file as of 07/24/2021.    Surgical History: Past Surgical History:  Procedure Laterality Date   CESAREAN SECTION     3 cesarean sections    Medical History: Past Medical History:  Diagnosis Date   Hypertension     Family History: Family History  Problem Relation Age of Onset   Cancer Mother        Female   Hypertension Mother     Social History   Socioeconomic History   Marital status: Married    Spouse name: Not on file   Number of children: Not on file   Years of education: Not on file   Highest education level: Not on file  Occupational History   Not on file  Tobacco Use   Smoking status: Every Day    Packs/day: 0.50    Types: Cigarettes   Smokeless tobacco: Never  Substance and Sexual Activity   Alcohol use: No   Drug use: No   Sexual activity: Yes    Birth control/protection: None  Other Topics Concern   Not on file  Social History Narrative   Not on file   Social Determinants of Health   Financial Resource Strain: Not on file  Food Insecurity: Not on file  Transportation Needs: Not on file  Physical Activity: Not on file  Stress: Not on file  Social Connections: Not on file  Intimate Partner Violence: Not on file     Review of Systems  Constitutional:  Negative for chills, fatigue and unexpected weight change.  HENT:  Negative for congestion, rhinorrhea, sneezing and sore throat.   Eyes:  Negative for redness.  Respiratory:  Negative for cough, chest tightness and shortness of breath.   Cardiovascular:  Negative for chest pain and palpitations.  Gastrointestinal:  Negative for abdominal pain, constipation, diarrhea, nausea and vomiting.  Genitourinary:  Negative for dysuria and frequency.  Musculoskeletal:  Negative for arthralgias, back pain, joint swelling and neck pain.  Skin:  Negative for rash.  Neurological: Negative.  Negative for tremors and numbness.   Hematological:  Negative for adenopathy. Does not bruise/bleed easily.  Psychiatric/Behavioral:  Negative for behavioral problems (Depression), sleep disturbance and suicidal ideas. The patient is not nervous/anxious.    Vital Signs: BP (!) 168/90    Pulse 90    Temp 98.2 F (36.8 C)    Resp 16    Ht 5' 4"  (1.626 m)    Wt 201 lb 12.8 oz (91.5 kg)    LMP 01/02/2016    SpO2 99%    BMI 34.64 kg/m    Physical Exam Vitals reviewed.  Constitutional:      General: She is not in acute distress.    Appearance: Normal appearance. She is obese. She is not ill-appearing.  HENT:     Head: Normocephalic and atraumatic.  Eyes:     Pupils: Pupils are equal, round, and reactive to light.  Cardiovascular:     Rate and Rhythm: Normal rate and regular rhythm.  Pulmonary:     Effort: Pulmonary effort is normal. No respiratory distress.  Neurological:     Mental Status: She is alert and oriented to person, place, and time.     Cranial Nerves: No cranial nerve deficit.     Coordination: Coordination normal.     Gait: Gait normal.  Psychiatric:        Mood and Affect: Mood normal.        Behavior: Behavior normal.      Assessment/Plan: 1. Essential hypertension, benign Takes losartan 100 mg daily. Blood pressure is not optimally controlled  - losartan (COZAAR) 100 MG tablet; Take 100 mg by mouth daily.  2. Chronic congestive heart failure, unspecified heart failure type ([place]) Echo ordered - ECHOCARDIOGRAM COMPLETE; Future  3. Prediabetes Thyroid levels ordered.  - TSH + free T4  4. Other fatigue Labs ordered to rule out possible causes of fatigue.  - B12 and Folate Panel - FSH/LH - Estradiol - TSH + free T4 - POCT HgB A1C - Iron, TIBC and Ferritin Panel  5. Impaired fasting glucose Routine labs ordered. A1C is 6.3, she is prediabetic. Discussed helpful diet modifications.  - CMP14+EGFR - TSH + free T4 - POCT HgB A1C  6. Iron deficiency anemia, unspecified iron deficiency  anemia type Routine lab ordered - Iron, TIBC and Ferritin Panel  7. Mixed hyperlipidemia Routine lab ordered - Lipid Profile  8. B12 deficiency Routine lab ordered - B12 and Folate Panel  9. Vitamin D deficiency Routine lab ordered.  - Vitamin D (25 hydroxy)  10. Acute right-sided low back pain with right-sided sciatica Xray of lumbar spine ordered to assess for skeletal abnormalities or acute injury.  - DG Lumbar Spine Complete; Future  11. Symptomatic menopausal or female climacteric states Labs ordered - FSH/LH - Estradiol  12. Vaginal odor Blind vaginal swab done by  patient in office to test for bacterial vaginosis.  - NuSwab Vaginitis Plus (VG+)  13. Dysuria Urinalysis negative for UTI - POCT Urinalysis Dipstick  14. Encounter to establish care with new doctor Need PCP, due for labs, needs echo.     General Counseling: Kathleen [name] understanding of the findings of todays visit and agrees with plan of treatment. I have discussed any further diagnostic evaluation that may be needed or ordered today. We also reviewed her medications today. she has been encouraged to call the office with any questions or concerns that should arise related to todays visit.    Counseling:  [place] Controlled Substance Database was reviewed by me.  Orders Placed This Encounter  Procedures   DG Lumbar Spine Complete   B12 and Folate Panel   Vitamin D (25 hydroxy)   CMP14+EGFR   FSH/LH   Estradiol   TSH + free T4   Lipid Profile   Iron, TIBC and Ferritin Panel   NuSwab Vaginitis Plus (VG+)   POCT Urinalysis Dipstick   POCT HgB A1C   ECHOCARDIOGRAM COMPLETE    No orders of the defined types were placed in this encounter.   Return in about 3 weeks (around 08/14/2021) for F/U, Echo @ [place], Review labs/test, Aydin Cavalieri PCP and also needs CPEw/PAP at earliest available..  Time spent:30 Minutes  This patient was seen by Jonetta Osgood, FNP-C in collaboration with Dr. Clayborn Bigness as a part of collaborative care agreement.    Kathleen [name] R. Valetta Fuller, MSN, FNP-C Internal Medicine

## 2021-07-25 ENCOUNTER — Telehealth: Payer: Self-pay

## 2021-07-25 LAB — CMP14+EGFR
ALT: 16 IU/L (ref 0–32)
AST: 17 IU/L (ref 0–40)
Albumin/Globulin Ratio: 1.5 (ref 1.2–2.2)
Albumin: 4.2 g/dL (ref 3.8–4.9)
Alkaline Phosphatase: 90 IU/L (ref 44–121)
BUN/Creatinine Ratio: 27 — ABNORMAL HIGH (ref 9–23)
BUN: 25 mg/dL — ABNORMAL HIGH (ref 6–24)
Bilirubin Total: 0.3 mg/dL (ref 0.0–1.2)
CO2: 23 mmol/L (ref 20–29)
Calcium: 10.4 mg/dL — ABNORMAL HIGH (ref 8.7–10.2)
Chloride: 106 mmol/L (ref 96–106)
Creatinine, Ser: 0.91 mg/dL (ref 0.57–1.00)
Globulin, Total: 2.8 g/dL (ref 1.5–4.5)
Glucose: 100 mg/dL — ABNORMAL HIGH (ref 70–99)
Potassium: 4 mmol/L (ref 3.5–5.2)
Sodium: 144 mmol/L (ref 134–144)
Total Protein: 7 g/dL (ref 6.0–8.5)
eGFR: 75 mL/min/{1.73_m2} (ref >59)

## 2021-07-25 LAB — B12 AND FOLATE PANEL
Folate: >20 ng/mL (ref >3.0)
Vitamin B-12: >2000 pg/mL — ABNORMAL HIGH (ref 232–1245)

## 2021-07-25 LAB — VITAMIN D 25 HYDROXY (VIT D DEFICIENCY, FRACTURES): Vit D, 25-Hydroxy: 19.6 ng/mL — ABNORMAL LOW (ref 30.0–100.0)

## 2021-07-25 LAB — IRON,TIBC AND FERRITIN PANEL
Ferritin: 150 ng/mL (ref 15–150)
Iron Saturation: 21 % (ref 15–55)
Iron: 60 ug/dL (ref 27–159)
Total Iron Binding Capacity: 291 ug/dL (ref 250–450)
UIBC: 231 ug/dL (ref 131–425)

## 2021-07-25 LAB — ESTRADIOL: Estradiol: <5 pg/mL

## 2021-07-25 LAB — LIPID PANEL
Chol/HDL Ratio: 3.8 ratio (ref 0.0–4.4)
Cholesterol, Total: 194 mg/dL (ref 100–199)
HDL: 51 mg/dL (ref >39)
LDL Chol Calc (NIH): 122 mg/dL — ABNORMAL HIGH (ref 0–99)
Triglycerides: 115 mg/dL (ref 0–149)
VLDL Cholesterol Cal: 21 mg/dL (ref 5–40)

## 2021-07-25 LAB — TSH+FREE T4
Free T4: 1.44 ng/dL (ref 0.82–1.77)
TSH: 0.653 u[IU]/mL (ref 0.450–4.500)

## 2021-07-25 LAB — FSH/LH
FSH: 46.7 m[IU]/mL
LH: 35.5 m[IU]/mL

## 2021-07-25 NOTE — Telephone Encounter (Signed)
No answer, vm full. Sent mychart message to confirm 07/30/21 ultrasound appointment-Toni

## 2021-07-26 LAB — NUSWAB VAGINITIS PLUS (VG+)
BVAB 2: HIGH Score — AB
Candida albicans, NAA: NEGATIVE
Candida glabrata, NAA: NEGATIVE
Chlamydia trachomatis, NAA: NEGATIVE
Neisseria gonorrhoeae, NAA: NEGATIVE
Trich vag by NAA: NEGATIVE

## 2021-07-30 ENCOUNTER — Other Ambulatory Visit: Payer: Self-pay

## 2021-07-30 ENCOUNTER — Ambulatory Visit: Payer: PRIVATE HEALTH INSURANCE

## 2021-07-30 ENCOUNTER — Telehealth: Payer: Self-pay

## 2021-07-30 DIAGNOSIS — R0602 Shortness of breath: Secondary | ICD-10-CM | POA: Diagnosis not present

## 2021-07-30 DIAGNOSIS — B9689 Other specified bacterial agents as the cause of diseases classified elsewhere: Secondary | ICD-10-CM

## 2021-07-30 DIAGNOSIS — I509 Heart failure, unspecified: Secondary | ICD-10-CM

## 2021-07-30 DIAGNOSIS — R5383 Other fatigue: Secondary | ICD-10-CM

## 2021-07-30 MED ORDER — METRONIDAZOLE 500 MG PO TABS: 500.0000 mg | ORAL_TABLET | Freq: Two times a day (BID) | ORAL | 0 refills | Status: DC

## 2021-07-30 NOTE — Telephone Encounter (Signed)
-----   Message from [name], NP sent at 07/30/2021  4:15 AM EST ----- Hi Alex,  Please call patient and let her know that her vaginal swab was borderline abnormal for possible bacterial vaginosis. I discussed with her what this is when I saw her in the office. I can send an antibiotic to treat it.  The rest of her labs we will discuss at her office visit later this month.  ----- Message ----- From: Jeannetta Ellis, CMA Sent: 07/24/2021  10:33 AM EST To: [name], NP

## 2021-07-30 NOTE — Progress Notes (Signed)
I have reviewed the lab results. There are no critically abnormal values requiring immediate intervention but there are some abnormals that will be discussed at the next office visit.  

## 2021-07-30 NOTE — Progress Notes (Signed)
Anterolisthesis and some degenerative changes of the right hip. Will discuss at next office visit

## 2021-07-30 NOTE — Telephone Encounter (Signed)
Spoke to pt, she is aware med was sent to pharmacy. Pt had previously stated she will pick it up on Friday.

## 2021-08-14 ENCOUNTER — Encounter: Payer: Self-pay | Admitting: Nurse Practitioner

## 2021-08-14 ENCOUNTER — Ambulatory Visit: Payer: PRIVATE HEALTH INSURANCE | Admitting: Nurse Practitioner

## 2021-08-14 ENCOUNTER — Other Ambulatory Visit: Payer: Self-pay

## 2021-08-14 VITALS — BP 158/100 | HR 80 | Temp 98.0°F | Resp 16 | Ht 64.0 in | Wt 201.8 lb

## 2021-08-14 DIAGNOSIS — I10 Essential (primary) hypertension: Secondary | ICD-10-CM | POA: Diagnosis not present

## 2021-08-14 DIAGNOSIS — I340 Nonrheumatic mitral (valve) insufficiency: Secondary | ICD-10-CM

## 2021-08-14 DIAGNOSIS — E782 Mixed hyperlipidemia: Secondary | ICD-10-CM | POA: Diagnosis not present

## 2021-08-14 DIAGNOSIS — G8929 Other chronic pain: Secondary | ICD-10-CM

## 2021-08-14 DIAGNOSIS — B3731 Acute candidiasis of vulva and vagina: Secondary | ICD-10-CM

## 2021-08-14 DIAGNOSIS — R7303 Prediabetes: Secondary | ICD-10-CM

## 2021-08-14 DIAGNOSIS — M545 Low back pain, unspecified: Secondary | ICD-10-CM

## 2021-08-14 MED ORDER — FLUCONAZOLE 150 MG PO TABS: 150.0000 mg | ORAL_TABLET | Freq: Once | ORAL | 0 refills | Status: AC

## 2021-08-14 NOTE — Progress Notes (Signed)
[facility] [address] [place], [place] [address]  Internal MEDICINE  Office Visit Note  Patient Name: Kathleen [name]  [id number]  [id number]  Date of Service: 08/14/2021  Chief Complaint  Patient presents with   Follow-up    Labs and U/S, back pain   Results    HPI Kathleen [name] presents for a follow up visit to discuss lab results, echocardiogram and lumbar xray.  Her A1C is 6.3 which is prediabetic. She is not on any medications for this right now. She has been working on diet modifications.  Labs discussed: iron panel is normal, ferritin level is normal. B12 and folate levels are normal. Vitamin D level is significantly low at 19.6. Her metabolic panel is grossly normal except for slight hypercalcemia at 10.4. FSH level is 46.7 which is postmenopausal. Estradiol is normal for postmenopause. Her thyroid levels are normal. Her lipid panel is normal except for a slightly elevated LDL.  Lumbar xray shows some degenerative changes and grade 1 anterolisthesis of L4-L5. Some degenerative changes seen in the right hip on xray.  She had an echocardiogram done earlier this month. Her echo showed mild left atrial dilation, mildly increased LV wall thickness, LVEF 68.7%, and moderate mitral regurgitation. The rest of the echo was grossly normal.     Current Medication: Outpatient Encounter Medications as of 08/14/2021  Medication Sig   aspirin EC 81 MG tablet Take 1 tablet (81 mg total) by mouth daily. Swallow whole.   carvedilol (COREG) 6.25 MG tablet Take 6.25 mg by mouth 2 (two) times daily with a meal.   Cyanocobalamin (VITAMIN B12 PO) Take by mouth.   [EXPIRED] fluconazole (DIFLUCAN) 150 MG tablet Take 1 tablet (150 mg total) by mouth once for 1 dose. May take an additional dose after 3 days if still symptomatic.   ibuprofen (ADVIL,MOTRIN) 600 MG tablet Take 1 tablet (600 mg total) by mouth every 6 (six) hours as needed (mild pain).   losartan (COZAAR) 100 MG tablet Take 100  mg by mouth daily.   Multiple Vitamins-Minerals (ONE-A-DAY ENERGY PO) Take 1 tablet by mouth daily.   spironolactone (ALDACTONE) 25 MG tablet Take 25 mg by mouth daily. Take 1/2 tablet daily (12.5 mg)   [DISCONTINUED] losartan (COZAAR) 50 MG tablet Take 1 tablet (50 mg total) by mouth daily.   [DISCONTINUED] metroNIDAZOLE (FLAGYL) 500 MG tablet Take 1 tablet (500 mg total) by mouth 2 (two) times daily. (Patient not taking: Reported on 08/29/2021)   No facility-administered encounter medications on file as of 08/14/2021.    Surgical History: Past Surgical History:  Procedure Laterality Date   CESAREAN SECTION     3 cesarean sections    Medical History: Past Medical History:  Diagnosis Date   Hypertension     Family History: Family History  Problem Relation Age of Onset   Cancer Mother        Female   Hypertension Mother     Social History   Socioeconomic History   Marital status: Married    Spouse name: Not on file   Number of children: Not on file   Years of education: Not on file   Highest education level: Not on file  Occupational History   Not on file  Tobacco Use   Smoking status: Every Day    Packs/day: 0.50    Types: Cigarettes   Smokeless tobacco: Never  Substance and Sexual Activity   Alcohol use: No   Drug use: No   Sexual activity:  Yes    Birth control/protection: None  Other Topics Concern   Not on file  Social History Narrative   Not on file   Social Determinants of Health   Financial Resource Strain: Not on file  Food Insecurity: Not on file  Transportation Needs: Not on file  Physical Activity: Not on file  Stress: Not on file  Social Connections: Not on file  Intimate Partner Violence: Not on file      Review of Systems  Constitutional:  Negative for chills, fatigue and unexpected weight change.  HENT:  Negative for congestion, rhinorrhea, sneezing and sore throat.   Respiratory:  Negative for cough, chest tightness and shortness of  breath.   Cardiovascular:  Negative for chest pain and palpitations.  Gastrointestinal:  Negative for abdominal pain, constipation, diarrhea, nausea and vomiting.  Musculoskeletal:  Negative for arthralgias, back pain, joint swelling and neck pain.  Skin:  Negative for rash.  Neurological: Negative.  Negative for tremors and numbness.  Psychiatric/Behavioral:  Negative for behavioral problems (Depression), sleep disturbance and suicidal ideas. The patient is not nervous/anxious.    Vital Signs: BP (!) 158/100 Comment: 160/100   Pulse 80    Temp 98 F (36.7 C)    Resp 16    Ht 5\' 4"  (1.626 m)    Wt 201 lb 12.8 oz (91.5 kg)    LMP 01/02/2016    SpO2 97%    BMI 34.64 kg/m    Physical Exam Vitals reviewed.  Constitutional:      General: She is not in acute distress.    Appearance: Normal appearance. She is obese. She is not ill-appearing.  HENT:     Head: Normocephalic and atraumatic.  Eyes:     Pupils: Pupils are equal, round, and reactive to light.  Cardiovascular:     Rate and Rhythm: Normal rate and regular rhythm.  Pulmonary:     Effort: Pulmonary effort is normal. No respiratory distress.  Neurological:     Mental Status: She is alert and oriented to person, place, and time.  Psychiatric:        Mood and Affect: Mood normal.        Behavior: Behavior normal.       Assessment/Plan: 1. Essential hypertension, benign Blood pressure is still elevated. She is seeing a cardiologist and has an upcoming appointment with him. She will discuss medication adjustments with him.   2. Moderate mitral valve regurgitation Seen on echocardiogram. Repeat echo in 1 year.   3. Prediabetes Stable, working on diet, will repeat A1C in 2 months.   4. Mixed hyperlipidemia Slightly elevated LDL at 122. Limit red meat intake, increase lean proteins, add an OTC fish oil supplement 1000 mg daily.   5. Chronic right-sided low back pain without sciatica Continue ibuprofen as needed. If  something stronger is needed, will discuss at that time. No orthopedic referral right now      General Counseling: Kathleen [name] verbalizes understanding of the findings of todays visit and agrees with plan of treatment. I have discussed any further diagnostic evaluation that may be needed or ordered today. We also reviewed her medications today. she has been encouraged to call the office with any questions or concerns that should arise related to todays visit.    No orders of the defined types were placed in this encounter.   Meds ordered this encounter  Medications   fluconazole (DIFLUCAN) 150 MG tablet    Sig: Take 1 tablet (150 mg total) by mouth  once for 1 dose. May take an additional dose after 3 days if still symptomatic.    Dispense:  3 tablet    Refill:  0    Return for previously scheduled, CPE, Glenn Christo PCP.   Total time spent:30 Minutes Time spent includes review of chart, medications, test results, and follow up plan with the patient.   [place] Controlled Substance Database was reviewed by me.  This patient was seen by [name], FNP-C in collaboration with Dr. Beverely Risen as a part of collaborative care agreement.   Taren Dymek R. Tedd Sias, MSN, FNP-C Internal medicine

## 2021-08-29 ENCOUNTER — Other Ambulatory Visit: Payer: Self-pay

## 2021-08-29 ENCOUNTER — Encounter: Payer: Self-pay | Admitting: Nurse Practitioner

## 2021-08-29 ENCOUNTER — Ambulatory Visit (INDEPENDENT_AMBULATORY_CARE_PROVIDER_SITE_OTHER): Payer: PRIVATE HEALTH INSURANCE | Admitting: Nurse Practitioner

## 2021-08-29 VITALS — BP 140/78 | HR 76 | Temp 98.5°F | Resp 16 | Ht 64.0 in | Wt 200.0 lb

## 2021-08-29 DIAGNOSIS — R300 Dysuria: Secondary | ICD-10-CM

## 2021-08-29 DIAGNOSIS — Z1212 Encounter for screening for malignant neoplasm of rectum: Secondary | ICD-10-CM

## 2021-08-29 DIAGNOSIS — Z0001 Encounter for general adult medical examination with abnormal findings: Secondary | ICD-10-CM | POA: Diagnosis not present

## 2021-08-29 DIAGNOSIS — I509 Heart failure, unspecified: Secondary | ICD-10-CM | POA: Diagnosis not present

## 2021-08-29 DIAGNOSIS — R7303 Prediabetes: Secondary | ICD-10-CM

## 2021-08-29 DIAGNOSIS — Z1231 Encounter for screening mammogram for malignant neoplasm of breast: Secondary | ICD-10-CM

## 2021-08-29 DIAGNOSIS — I10 Essential (primary) hypertension: Secondary | ICD-10-CM | POA: Diagnosis not present

## 2021-08-29 DIAGNOSIS — Z1211 Encounter for screening for malignant neoplasm of colon: Secondary | ICD-10-CM

## 2021-08-29 DIAGNOSIS — Z720 Tobacco use: Secondary | ICD-10-CM

## 2021-08-29 MED ORDER — CHANTIX STARTING MONTH PAK 0.5 MG X 11 & 1 MG X 42 PO TBPK: ORAL_TABLET | ORAL | 0 refills | Status: AC

## 2021-08-29 NOTE — Progress Notes (Cosign Needed)
[facility] [place], [place] [address]  Internal MEDICINE  Office Visit Note  Patient Name: Kathleen [name]  [id number]  [id number]  Date of Service: 08/29/2021  Chief Complaint  Patient presents with   Annual Exam   Hypertension    HPI Loy presents for an annual well visit and physical exam. She is a well appearing 56 yo female with hypertension. Her blood pressure has improved since her previous office visit. She is requesting medication to help her with smoking cessation. She is due for routine screening mammogram and colorectal cancer screening. She has requested to hold off on her pap smear until next year. She is working on diet modifications to help her lose weight and is having a problem with decreasing starches in her diet. Patient reports that she eats a lot of bread and potatoes and will work on decreasing this in the future.     Current Medication: Outpatient Encounter Medications as of 08/29/2021  Medication Sig   aspirin EC 81 MG tablet Take 1 tablet (81 mg total) by mouth daily. Swallow whole.   carvedilol (COREG) 6.25 MG tablet Take 6.25 mg by mouth 2 (two) times daily with a meal.   Cyanocobalamin (VITAMIN B12 PO) Take by mouth.   ibuprofen (ADVIL,MOTRIN) 600 MG tablet Take 1 tablet (600 mg total) by mouth every 6 (six) hours as needed (mild pain).   losartan (COZAAR) 100 MG tablet Take 100 mg by mouth daily.   losartan (COZAAR) 50 MG tablet Take 1 tablet (50 mg total) by mouth daily.   Multiple Vitamins-Minerals (ONE-A-DAY ENERGY PO) Take 1 tablet by mouth daily.   spironolactone (ALDACTONE) 25 MG tablet Take 25 mg by mouth daily. Take 1/2 tablet daily (12.5 mg)   Varenicline Tartrate, Starter, (CHANTIX STARTING MONTH PAK) 0.5 MG X 11 & 1 MG X 42 TBPK Take 0.5 mg by mouth daily for 3 days, THEN 0.5 mg 2 (two) times daily for 4 days, THEN 1 mg 2 (two) times daily for 23 days.   [DISCONTINUED] metroNIDAZOLE (FLAGYL) 500 MG tablet Take 1  tablet (500 mg total) by mouth 2 (two) times daily. (Patient not taking: Reported on 08/29/2021)   No facility-administered encounter medications on file as of 08/29/2021.    Surgical History: Past Surgical History:  Procedure Laterality Date   CESAREAN SECTION     3 cesarean sections    Medical History: Past Medical History:  Diagnosis Date   Hypertension     Family History: Family History  Problem Relation Age of Onset   Cancer Mother        Female   Hypertension Mother     Social History   Socioeconomic History   Marital status: Married    Spouse name: Not on file   Number of children: Not on file   Years of education: Not on file   Highest education level: Not on file  Occupational History   Not on file  Tobacco Use   Smoking status: Every Day    Packs/day: 0.50    Types: Cigarettes   Smokeless tobacco: Never  Substance and Sexual Activity   Alcohol use: No   Drug use: No   Sexual activity: Yes    Birth control/protection: None  Other Topics Concern   Not on file  Social History Narrative   Not on file   Social Determinants of Health   Financial Resource Strain: Not on file  Food Insecurity: Not on file  Transportation Needs:  Not on file  Physical Activity: Not on file  Stress: Not on file  Social Connections: Not on file  Intimate Partner Violence: Not on file      Review of Systems  Constitutional:  Negative for activity change, appetite change, chills, fatigue, fever and unexpected weight change.  HENT: Negative.  Negative for congestion, ear pain, rhinorrhea, sore throat and trouble swallowing.   Eyes: Negative.   Respiratory: Negative.  Negative for cough, chest tightness, shortness of breath and wheezing.   Cardiovascular: Negative.  Negative for chest pain.  Gastrointestinal: Negative.  Negative for abdominal pain, blood in stool, constipation, diarrhea, nausea and vomiting.  Endocrine: Negative.   Genitourinary: Negative.  Negative for  difficulty urinating, dysuria, frequency, hematuria and urgency.  Musculoskeletal: Negative.  Negative for arthralgias, back pain, joint swelling, myalgias and neck pain.  Skin: Negative.  Negative for rash and wound.  Allergic/Immunologic: Negative.  Negative for immunocompromised state.  Neurological: Negative.  Negative for dizziness, seizures, numbness and headaches.  Hematological: Negative.   Psychiatric/Behavioral: Negative.  Negative for behavioral problems, self-injury and suicidal ideas. The patient is not nervous/anxious.    Vital Signs: BP 140/78 Comment: 144/80   Pulse 76    Temp 98.5 F (36.9 C)    Resp 16    Ht 5\' 4"  (1.626 m)    Wt 200 lb (90.7 kg)    LMP 01/02/2016    SpO2 99%    BMI 34.33 kg/m    Physical Exam Vitals reviewed.  Constitutional:      General: She is not in acute distress.    Appearance: Normal appearance. She is well-developed. She is obese. She is not ill-appearing or diaphoretic.  HENT:     Head: Normocephalic and atraumatic.     Right Ear: Tympanic membrane, ear canal and external ear normal.     Left Ear: Tympanic membrane, ear canal and external ear normal.     Nose: Nose normal. No congestion or rhinorrhea.     Mouth/Throat:     Mouth: Mucous membranes are moist.     Pharynx: Oropharynx is clear. No oropharyngeal exudate or posterior oropharyngeal erythema.  Eyes:     General: No scleral icterus.       Right eye: No discharge.        Left eye: No discharge.     Extraocular Movements: Extraocular movements intact.     Conjunctiva/sclera: Conjunctivae normal.     Pupils: Pupils are equal, round, and reactive to light.  Neck:     Thyroid: No thyromegaly.     Vascular: No JVD.     Trachea: No tracheal deviation.  Cardiovascular:     Rate and Rhythm: Normal rate and regular rhythm.     Pulses: Normal pulses.     Heart sounds: Normal heart sounds. No murmur heard.   No friction rub. No gallop.  Pulmonary:     Effort: Pulmonary effort is  normal. No respiratory distress.     Breath sounds: Normal breath sounds. No stridor. No wheezing or rales.  Chest:     Chest wall: No tenderness.  Abdominal:     General: Bowel sounds are normal. There is no distension.     Palpations: Abdomen is soft. There is no mass.     Tenderness: There is no abdominal tenderness. There is no guarding or rebound.  Musculoskeletal:        General: No tenderness or deformity. Normal range of motion.     Cervical back: Normal range  of motion and neck supple.  Lymphadenopathy:     Cervical: No cervical adenopathy.  Skin:    General: Skin is warm and dry.     Capillary Refill: Capillary refill takes less than 2 seconds.     Coloration: Skin is not pale.     Findings: No erythema or rash.  Neurological:     Mental Status: She is alert and oriented to person, place, and time.     Cranial Nerves: No cranial nerve deficit.     Motor: No abnormal muscle tone.     Coordination: Coordination normal.     Gait: Gait normal.     Deep Tendon Reflexes: Reflexes are normal and symmetric.  Psychiatric:        Mood and Affect: Mood normal.        Behavior: Behavior normal.        Thought Content: Thought content normal.        Judgment: Judgment normal.       Assessment/Plan: 1. Encounter for routine adult health examination with abnormal findings Age-appropriate preventive screenings and vaccinations discussed, annual physical exam completed. Routine labs for health maintenance drawn and results discussed previously. PHM updated.   2. Essential hypertension, benign Stable with current medications.   3. Chronic congestive heart failure, unspecified heart failure type ([place]) Followed and managed by cardiology  4. Prediabetes A1C is 6.3 in January, will recheck the A1C in 2 months. Encouraged patient to adhere to ADA diet recommendations.   5. Screening for colorectal cancer Cologard test ordered - Cologuard  6. Encounter for screening mammogram  for malignant neoplasm of breast Routine mammogram ordered - MM 3D SCREEN BREAST BILATERAL; Future  7. Dysuria Routine urinalysis done - UA/M w/rflx Culture, Routine  8. Currently attempting to quit smoking Start chantix as per directions on prescription - Varenicline Tartrate, Starter, (CHANTIX STARTING MONTH PAK) 0.5 MG X 11 & 1 MG X 42 TBPK; Take 0.5 mg by mouth daily for 3 days, THEN 0.5 mg 2 (two) times daily for 4 days, THEN 1 mg 2 (two) times daily for 23 days.  Dispense: 53 each; Refill: 0       General Counseling: Shenna verbalizes understanding of the findings of todays visit and agrees with plan of treatment. I have discussed any further diagnostic evaluation that may be needed or ordered today. We also reviewed her medications today. she has been encouraged to call the office with any questions or concerns that should arise related to todays visit.    Orders Placed This Encounter  Procedures   MM 3D SCREEN BREAST BILATERAL   UA/M w/rflx Culture, Routine   Cologuard    Meds ordered this encounter  Medications   Varenicline Tartrate, Starter, (CHANTIX STARTING MONTH PAK) 0.5 MG X 11 & 1 MG X 42 TBPK    Sig: Take 0.5 mg by mouth daily for 3 days, THEN 0.5 mg 2 (two) times daily for 4 days, THEN 1 mg 2 (two) times daily for 23 days.    Dispense:  53 each    Refill:  0    Return in about 6 months (around 02/26/2022) for F/U, med refill, [place] PCP.   Total time spent:30 Minutes Time spent includes review of chart, medications, test results, and follow up plan with the patient.   [place] Controlled Substance Database was reviewed by me.  This patient was seen by Jonetta Osgood, FNP-C in collaboration with Dr. Clayborn Bigness as a part of collaborative care agreement.  Ebert Forrester R. Valetta Fuller, MSN, FNP-C Internal medicine

## 2021-08-30 ENCOUNTER — Encounter: Payer: Self-pay | Admitting: Nurse Practitioner

## 2021-09-03 LAB — MICROSCOPIC EXAMINATION
Bacteria, UA: NONE SEEN
Casts: NONE SEEN /lpf
WBC, UA: >30 /hpf — AB (ref 0–5)

## 2021-09-03 LAB — UA/M W/RFLX CULTURE, ROUTINE
Bilirubin, UA: NEGATIVE
Glucose, UA: NEGATIVE
Ketones, UA: NEGATIVE
Nitrite, UA: NEGATIVE
RBC, UA: NEGATIVE
Specific Gravity, UA: >=1.03 — AB (ref 1.005–1.030)
Urobilinogen, Ur: 1 mg/dL (ref 0.2–1.0)
pH, UA: 5 (ref 5.0–7.5)

## 2021-09-03 LAB — URINE CULTURE, REFLEX

## 2021-09-07 ENCOUNTER — Encounter: Payer: Self-pay | Admitting: Nurse Practitioner

## 2021-09-27 ENCOUNTER — Encounter: Payer: Self-pay | Admitting: Nurse Practitioner

## 2021-12-01 ENCOUNTER — Encounter: Payer: Self-pay | Admitting: Internal Medicine

## 2021-12-01 ENCOUNTER — Ambulatory Visit: Payer: PRIVATE HEALTH INSURANCE | Admitting: Internal Medicine

## 2021-12-01 VITALS — BP 152/98 | HR 79 | Temp 98.1°F | Resp 16 | Ht 64.0 in | Wt 205.4 lb

## 2021-12-01 DIAGNOSIS — M545 Low back pain, unspecified: Secondary | ICD-10-CM | POA: Diagnosis not present

## 2021-12-01 DIAGNOSIS — I509 Heart failure, unspecified: Secondary | ICD-10-CM | POA: Diagnosis not present

## 2021-12-01 DIAGNOSIS — G8929 Other chronic pain: Secondary | ICD-10-CM

## 2021-12-01 DIAGNOSIS — M069 Rheumatoid arthritis, unspecified: Secondary | ICD-10-CM | POA: Insufficient documentation

## 2021-12-01 DIAGNOSIS — G4733 Obstructive sleep apnea (adult) (pediatric): Secondary | ICD-10-CM | POA: Diagnosis not present

## 2021-12-01 NOTE — Patient Instructions (Signed)

## 2021-12-01 NOTE — Progress Notes (Signed)
[facility] [address] [place], [place] [address]  Pulmonary Sleep Medicine   Office Visit Note  Patient Name: Kathleen [name] DOB: 1966/03/07 MRN [id number]  Date of Service: 12/01/2021  Complaints/HPI: She is still smoking. States that her insurance is not covering medical therapy. SHe has also had issues with weight not going down. She has knee problems. She states she does not really go to the gym and has wasted money. She states some SOB still noted. Patient denies having CP. She has no fevers or chills. She is on amlodipine which may cause fluid retention. Also on HCTZ. She wants to try ozempic. She still does not want to use the CPAP for her OSA.  ROS  General: (-) fever, (-) chills, (-) night sweats, (-) weakness Skin: (-) rashes, (-) itching,. Eyes: (-) visual changes, (-) redness, (-) itching. Nose and Sinuses: (-) nasal stuffiness or itchiness, (-) postnasal drip, (-) nosebleeds, (-) sinus trouble. Mouth and Throat: (-) sore throat, (-) hoarseness. Neck: (-) swollen glands, (-) enlarged thyroid, (-) neck pain. Respiratory: - cough, (-) bloody sputum, - shortness of breath, - wheezing. Cardiovascular: - ankle swelling, (-) chest pain. Lymphatic: (-) lymph node enlargement. Neurologic: (-) numbness, (-) tingling. Psychiatric: (-) anxiety, (-) depression   Current Medication: Outpatient Encounter Medications as of 12/01/2021  Medication Sig   amLODipine (NORVASC) 5 MG tablet Take 5 mg by mouth daily.   aspirin EC 81 MG tablet Take 1 tablet (81 mg total) by mouth daily. Swallow whole.   carvedilol (COREG) 25 MG tablet Take 25 mg by mouth 2 (two) times daily.   Cyanocobalamin (VITAMIN B12 PO) Take by mouth.   hydrochlorothiazide (HYDRODIURIL) 25 MG tablet Take 25 mg by mouth daily.   ibuprofen (ADVIL,MOTRIN) 600 MG tablet Take 1 tablet (600 mg total) by mouth every 6 (six) hours as needed (mild pain).   Multiple Vitamins-Minerals (ONE-A-DAY ENERGY PO) Take 1  tablet by mouth daily.   spironolactone (ALDACTONE) 25 MG tablet Take 25 mg by mouth daily. Take 1/2 tablet daily (12.5 mg)   [DISCONTINUED] carvedilol (COREG) 6.25 MG tablet Take 6.25 mg by mouth 2 (two) times daily with a meal.   [DISCONTINUED] losartan (COZAAR) 100 MG tablet Take 100 mg by mouth daily. (Patient not taking: Reported on 12/01/2021)   No facility-administered encounter medications on file as of 12/01/2021.    Surgical History: Past Surgical History:  Procedure Laterality Date   CESAREAN SECTION     3 cesarean sections    Medical History: Past Medical History:  Diagnosis Date   Hypertension    Rheumatoid arthritis (HCC)     Family History: Family History  Problem Relation Age of Onset   Cancer Mother        Female   Hypertension Mother     Social History: Social History   Socioeconomic History   Marital status: Married    Spouse name: Not on file   Number of children: Not on file   Years of education: Not on file   Highest education level: Not on file  Occupational History   Not on file  Tobacco Use   Smoking status: Every Day    Packs/day: 0.50    Types: Cigarettes   Smokeless tobacco: Never  Substance and Sexual Activity   Alcohol use: No   Drug use: No   Sexual activity: Yes    Birth control/protection: None  Other Topics Concern   Not on file  Social History Narrative  Not on file   Social Determinants of Health   Financial Resource Strain: Not on file  Food Insecurity: Not on file  Transportation Needs: Not on file  Physical Activity: Not on file  Stress: Not on file  Social Connections: Not on file  Intimate Partner Violence: Not on file    Vital Signs: Blood pressure (!) 152/98, pulse 79, temperature 98.1 F (36.7 C), resp. rate 16, height 5\' 4"  (1.626 m), weight 205 lb 6.4 oz (93.2 kg), last menstrual period 01/02/2016, SpO2 98 %.  Examination: General Appearance: The patient is well-developed, well-nourished, and in no  distress. Skin: Gross inspection of skin unremarkable. Head: normocephalic, no gross deformities. Eyes: no gross deformities noted. ENT: ears appear grossly normal no exudates. Neck: Supple. No thyromegaly. No LAD. Respiratory: no rhonchi noted at this time. Cardiovascular: Normal S1 and S2 without murmur or rub. Extremities: No cyanosis. pulses are equal. Neurologic: Alert and oriented. No involuntary movements.  LABS: No results found for this or any previous visit (from the past 2160 hour(s)).  Radiology: DG Lumbar Spine Complete  Result Date: 07/24/2021 CLINICAL DATA:  actue lower back pain with right hip pain EXAM: LUMBAR SPINE - COMPLETE 4+ VIEW COMPARISON:  None. FINDINGS: Markedly limited evaluation due to overlapping osseous structures and overlying soft tissues. Five non-rib-bearing lumbar vertebral bodies. There is no evidence of lumbar spine fracture. Grade 1 anterolisthesis of L4 on L5. Otherwise alignment is normal. Scattered degenerative changes spine. Right hip at least moderate degenerative changes. IMPRESSION: 1. No acute displaced fracture or traumatic listhesis of the lumbar spine. 2. Grade 1 anterolisthesis of L4 on L5. 3. Poorly visualized right hip at least moderate degenerative changes. Electronically Signed   By: Tish FredericksonMorgane  Naveau M.D.   On: 07/24/2021 16:55    No results found.  No results found.    Assessment and Plan: Patient Active Problem List   Diagnosis Date Noted   Rheumatoid arthritis (HCC) 12/01/2021   OSA (obstructive sleep apnea) 06/06/2021   Obesity, morbid (HCC) 06/06/2021   Obesity (BMI 30-39.9) 05/05/2021   Atypical pneumonia 04/16/2021   OA (osteoarthritis) of knee 05/02/2014   Essential hypertension, benign 04/13/2014   Right knee pain 04/13/2014   Tobacco use disorder 04/13/2014   Left pyosalpinx 02/04/2014    1. OSA (obstructive sleep apnea) Once again discussed importance of compliance with recommended therapy and treatment of  obstructive sleep apnea.  She currently is not interested in using the PAP therapy.  She does still have elevated blood pressure risk factors discussed in length with her once again  2. Obesity, morbid (HCC) Obesity Counseling: Had a lengthy discussion regarding patients BMI and weight issues. Patient was instructed on portion control as well as increased activity. Also discussed caloric restrictions with trying to maintain intake less than 2000 Kcal. Discussions were made in accordance with the 5As of weight management. Simple actions such as not eating late and if able to, taking a walk is suggested.   3. Chronic congestive heart failure, unspecified heart failure type (HCC) Monitor fluid status patient has been on spironolactone and also is on Coreg she will continue to follow with her PCP regarding the heart failure issues appears to be compensated at this time  4. Chronic right-sided low back pain without sciatica Pain management avoid excessive narcotic use secondary to the obstructive sleep apnea issues  General Counseling: I have discussed the findings of the evaluation and examination with Rayfield Citizenaroline.  I have also discussed any further diagnostic evaluation thatmay  be needed or ordered today. Azizah verbalizes understanding of the findings of todays visit. We also reviewed her medications today and discussed drug interactions and side effects including but not limited excessive drowsiness and altered mental states. We also discussed that there is always a risk not just to her but also people around her. she has been encouraged to call the office with any questions or concerns that should arise related to todays visit.  No orders of the defined types were placed in this encounter.    Time spent: 10  I have personally obtained a history, examined the patient, evaluated laboratory and imaging results, formulated the assessment and plan and placed orders.    Yevonne Pax, MD  [facility] Pulmonary and Critical Care Sleep medicine

## 2022-02-27 ENCOUNTER — Ambulatory Visit: Payer: PRIVATE HEALTH INSURANCE | Admitting: Nurse Practitioner

## 2022-03-02 ENCOUNTER — Ambulatory Visit: Payer: PRIVATE HEALTH INSURANCE | Admitting: Nurse Practitioner

## 2022-03-02 ENCOUNTER — Encounter: Payer: Self-pay | Admitting: Nurse Practitioner

## 2022-03-02 VITALS — BP 160/90 | HR 81 | Temp 97.5°F | Resp 16 | Ht 64.0 in | Wt 215.8 lb

## 2022-03-02 DIAGNOSIS — I509 Heart failure, unspecified: Secondary | ICD-10-CM

## 2022-03-02 DIAGNOSIS — R7303 Prediabetes: Secondary | ICD-10-CM

## 2022-03-02 DIAGNOSIS — I10 Essential (primary) hypertension: Secondary | ICD-10-CM | POA: Diagnosis not present

## 2022-03-02 DIAGNOSIS — I83899 Varicose veins of unspecified lower extremities with other complications: Secondary | ICD-10-CM

## 2022-03-02 DIAGNOSIS — E1151 Type 2 diabetes mellitus with diabetic peripheral angiopathy without gangrene: Secondary | ICD-10-CM

## 2022-03-02 DIAGNOSIS — E782 Mixed hyperlipidemia: Secondary | ICD-10-CM

## 2022-03-02 LAB — POCT GLYCOSYLATED HEMOGLOBIN (HGB A1C): Hemoglobin A1C: 6.5 % — AB (ref 4.0–5.6)

## 2022-03-02 MED ORDER — OLMESARTAN-AMLODIPINE-HCTZ 40-5-25 MG PO TABS: 1.0000 | ORAL_TABLET | Freq: Every day | ORAL | 2 refills | Status: DC

## 2022-03-02 MED ORDER — ROSUVASTATIN CALCIUM 5 MG PO TABS: 5.0000 mg | ORAL_TABLET | ORAL | 2 refills | Status: DC

## 2022-03-02 NOTE — Progress Notes (Signed)
[facility] [address] [place], [place] [address]  Internal MEDICINE  Office Visit Note  Patient Name: Kathleen [name]  [id number]  [id number]  Date of Service: 03/02/2022  Chief Complaint  Patient presents with   Follow-up   Hypertension   Joint Swelling    Ankle swelling on left side   Referral    Referral to vein and vascular   Chest Pain    Having on and off chest tightness for 2 weeks - not consistent, has spoken to cardiologist and has had stress test done - has been taking large amounts of Tylenol due to leg pain    HPI [name] presents for a follow up visit for hypertension, prediabetes, high cholesterol, varicose veins. Hypertension -- significantly elevated, not satisfied with her cardiologist not changing things up when the current regimen is not working. BP uncontrolled, bilateral LE edema, sporadic chest tightness for the past 2 weeks Prediabetes-- a1c 6.5 today, now in diabetic range.  High cholesterol -- cholesterol levels are significantly elevated, need statin therapy Spider veins/varicose -- painful tender areas on both legs bilaterally, wants referral to vascular surgery     Current Medication: Outpatient Encounter Medications as of 03/02/2022  Medication Sig   aspirin EC 81 MG tablet Take 1 tablet (81 mg total) by mouth daily. Swallow whole.   carvedilol (COREG) 25 MG tablet Take 25 mg by mouth 2 (two) times daily.   Cyanocobalamin (VITAMIN B12 PO) Take by mouth.   Multiple Vitamins-Minerals (ONE-A-DAY ENERGY PO) Take 1 tablet by mouth daily.   Olmesartan-amLODIPine-HCTZ 40-5-25 MG TABS Take 1 tablet by mouth daily with supper.   rosuvastatin (CRESTOR) 5 MG tablet Take 1 tablet (5 mg total) by mouth every other day.   [DISCONTINUED] amLODipine (NORVASC) 5 MG tablet Take 5 mg by mouth daily. (Patient not taking: Reported on 04/14/2022)   [DISCONTINUED] atorvastatin (LIPITOR) 40 MG tablet Take 40 mg by mouth daily.   [DISCONTINUED]  hydrochlorothiazide (HYDRODIURIL) 25 MG tablet Take 25 mg by mouth daily.   [DISCONTINUED] ibuprofen (ADVIL,MOTRIN) 600 MG tablet Take 1 tablet (600 mg total) by mouth every 6 (six) hours as needed (mild pain). (Patient not taking: Reported on 04/14/2022)   [DISCONTINUED] losartan-hydrochlorothiazide (HYZAAR) 100-25 MG tablet Take 1 tablet by mouth daily.   [DISCONTINUED] spironolactone (ALDACTONE) 25 MG tablet Take 25 mg by mouth daily. Take 1/2 tablet daily (12.5 mg) (Patient not taking: Reported on 04/14/2022)   No facility-administered encounter medications on file as of 03/02/2022.    Surgical History: Past Surgical History:  Procedure Laterality Date   CESAREAN SECTION     3 cesarean sections    Medical History: Past Medical History:  Diagnosis Date   Hypertension    Rheumatoid arthritis (HCC)     Family History: Family History  Problem Relation Age of Onset   Cancer Mother        Female   Hypertension Mother     Social History   Socioeconomic History   Marital status: Married    Spouse name: Not on file   Number of children: Not on file   Years of education: Not on file   Highest education level: Not on file  Occupational History   Not on file  Tobacco Use   Smoking status: Every Day    Packs/day: 0.50    Types: Cigarettes   Smokeless tobacco: Never  Substance and Sexual Activity   Alcohol use: No   Drug use: No   Sexual activity: Yes  Birth control/protection: None  Other Topics Concern   Not on file  Social History Narrative   Not on file   Social Determinants of Health   Financial Resource Strain: Not on file  Food Insecurity: Not on file  Transportation Needs: Not on file  Physical Activity: Not on file  Stress: Not on file  Social Connections: Not on file  Intimate Partner Violence: Not on file      Review of Systems  Constitutional:  Negative for chills, fatigue and unexpected weight change.  HENT:  Negative for congestion, rhinorrhea,  sneezing and sore throat.   Respiratory:  Negative for cough, chest tightness and shortness of breath.   Cardiovascular:  Negative for chest pain and palpitations.  Gastrointestinal:  Negative for abdominal pain, constipation, diarrhea, nausea and vomiting.  Musculoskeletal:  Negative for arthralgias, back pain, joint swelling and neck pain.  Skin:  Negative for rash.  Neurological: Negative.  Negative for tremors and numbness.  Psychiatric/Behavioral:  Negative for behavioral problems (Depression), sleep disturbance and suicidal ideas. The patient is not nervous/anxious.     Vital Signs: BP (!) 160/90 Comment: 172/40  Pulse 81   Temp (!) 97.5 F (36.4 C)   Resp 16   Ht 5\' 4"  (1.626 m)   Wt 215 lb 12.8 oz (97.9 kg)   LMP 01/02/2016   SpO2 97%   BMI 37.04 kg/m    Physical Exam Vitals reviewed.  Constitutional:      General: She is not in acute distress.    Appearance: Normal appearance. She is obese. She is not ill-appearing.  HENT:     Head: Normocephalic and atraumatic.  Eyes:     Pupils: Pupils are equal, round, and reactive to light.  Cardiovascular:     Rate and Rhythm: Normal rate and regular rhythm.  Pulmonary:     Effort: Pulmonary effort is normal. No respiratory distress.  Neurological:     Mental Status: She is alert and oriented to person, place, and time.  Psychiatric:        Mood and Affect: Mood normal.        Behavior: Behavior normal.        Assessment/Plan: 1. Essential hypertension, benign Discontinue losartan, amlodipine and HCTZ and spironolactone. Start triple therapy tablet olmesartan-amlodipine-HCTZ. Follow up in 2 weeks - Olmesartan-amLODIPine-HCTZ 40-5-25 MG TABS; Take 1 tablet by mouth daily with supper.  Dispense: 30 tablet; Refill: 2  2. Type 2 diabetes mellitus with diabetic peripheral angiopathy without gangrene, without long-term current use of insulin (HCC) Slightly increased a1c to 6.5, discussed diet and lifestyle modifications  that will improve her A1c, will repeat A1C in 3 months. - POCT glycosylated hemoglobin (Hb A1C)  3. Symptomatic spider varicose vein Referred to [facility], vascular surgery.  - Ambulatory referral to Vascular Surgery  4. Mixed hyperlipidemia Start rosuvastatin as prescribed. Atorvastatin discontinued - rosuvastatin (CRESTOR) 5 MG tablet; Take 1 tablet (5 mg total) by mouth every other day.  Dispense: 45 tablet; Refill: 2  5. Chronic congestive heart failure, unspecified heart failure type [facility]) Will discuss referral to new cardiologist at follow up visit in a couple weeks.   General Counseling: antonita mckendry understanding of the findings of todays visit and agrees with plan of treatment. I have discussed any further diagnostic evaluation that may be needed or ordered today. We also reviewed her medications today. she has been encouraged to call the office with any questions or concerns that should arise related to todays visit.  Orders Placed This Encounter  Procedures   Ambulatory referral to Vascular Surgery   POCT glycosylated hemoglobin (Hb A1C)    Meds ordered this encounter  Medications   rosuvastatin (CRESTOR) 5 MG tablet    Sig: Take 1 tablet (5 mg total) by mouth every other day.    Dispense:  45 tablet    Refill:  2    Discontinue atorvastatin, fill new script now.   Olmesartan-amLODIPine-HCTZ 40-5-25 MG TABS    Sig: Take 1 tablet by mouth daily with supper.    Dispense:  30 tablet    Refill:  2    Discontinue losartan-hydrochlorothiazide, hydrochlorothizide and amlodipine and please fill new prescription ASAP.    Return in about 2 weeks (around 03/16/2022) for F/U, eval new med, BP check, Verlisa Vara PCP.   Total time spent:30 Minutes Time spent includes review of chart, medications, test results, and follow up plan with the patient.   [place] Controlled Substance Database was reviewed by me.  This patient was seen by [name], FNP-C in collaboration with Dr.  Beverely Risen as a part of collaborative care agreement.   Derren Suydam R. Tedd Sias, MSN, FNP-C Internal medicine

## 2022-03-18 ENCOUNTER — Encounter: Payer: Self-pay | Admitting: Nurse Practitioner

## 2022-03-18 ENCOUNTER — Ambulatory Visit: Payer: PRIVATE HEALTH INSURANCE | Admitting: Nurse Practitioner

## 2022-03-18 VITALS — BP 130/75 | HR 90 | Temp 97.8°F | Resp 16 | Ht 64.0 in | Wt 212.0 lb

## 2022-03-18 DIAGNOSIS — X503XXA Overexertion from repetitive movements, initial encounter: Secondary | ICD-10-CM

## 2022-03-18 DIAGNOSIS — I509 Heart failure, unspecified: Secondary | ICD-10-CM

## 2022-03-18 DIAGNOSIS — I10 Essential (primary) hypertension: Secondary | ICD-10-CM

## 2022-03-18 MED ORDER — MELOXICAM 7.5 MG PO TABS: 7.5000 mg | ORAL_TABLET | Freq: Every day | ORAL | 0 refills | Status: DC

## 2022-03-18 MED ORDER — CYCLOBENZAPRINE HCL 10 MG PO TABS
10.0000 mg | ORAL_TABLET | Freq: Every evening | ORAL | 0 refills | Status: DC | PRN
Start: 2022-03-18 — End: 2022-05-22

## 2022-03-18 NOTE — Progress Notes (Signed)
[facility] [address] [place], [place] [address]  Internal MEDICINE  Office Visit Note  Patient Name: Kathleen [name]  [id number]  [id number]  Date of Service: 03/18/2022  Chief Complaint  Patient presents with   Medication Management    F/u bp   Hypertension    HPI Silva presents for a follow up visit for hypertension, and low back pain --high blood pressure -- much improved with most recent medication change.  --low back pain -- with work, having to move patients, turning and lifting. Trouble sleeping due to pain. Severity 8/10.      Current Medication: Outpatient Encounter Medications as of 03/18/2022  Medication Sig   amLODipine (NORVASC) 5 MG tablet Take 5 mg by mouth daily.   aspirin EC 81 MG tablet Take 1 tablet (81 mg total) by mouth daily. Swallow whole.   carvedilol (COREG) 25 MG tablet Take 25 mg by mouth 2 (two) times daily.   Cyanocobalamin (VITAMIN B12 PO) Take by mouth.   cyclobenzaprine (FLEXERIL) 10 MG tablet Take 1 tablet (10 mg total) by mouth at bedtime as needed for muscle spasms (and musculoskeletal pain). Take one tab po qhs for back spasm prn only   ibuprofen (ADVIL,MOTRIN) 600 MG tablet Take 1 tablet (600 mg total) by mouth every 6 (six) hours as needed (mild pain).   meloxicam (MOBIC) 7.5 MG tablet Take 1-2 tablets (7.5-15 mg total) by mouth daily.   Multiple Vitamins-Minerals (ONE-A-DAY ENERGY PO) Take 1 tablet by mouth daily.   Olmesartan-amLODIPine-HCTZ 40-5-25 MG TABS Take 1 tablet by mouth daily with supper.   rosuvastatin (CRESTOR) 5 MG tablet Take 1 tablet (5 mg total) by mouth every other day.   spironolactone (ALDACTONE) 25 MG tablet Take 25 mg by mouth daily. Take 1/2 tablet daily (12.5 mg)   [DISCONTINUED] losartan-hydrochlorothiazide (HYZAAR) 100-25 MG tablet Take 1 tablet by mouth daily.   No facility-administered encounter medications on file as of 03/18/2022.    Surgical History: Past Surgical History:  Procedure  Laterality Date   CESAREAN SECTION     3 cesarean sections    Medical History: Past Medical History:  Diagnosis Date   Hypertension    Rheumatoid arthritis (HCC)     Family History: Family History  Problem Relation Age of Onset   Cancer Mother        Female   Hypertension Mother     Social History   Socioeconomic History   Marital status: Married    Spouse name: Not on file   Number of children: Not on file   Years of education: Not on file   Highest education level: Not on file  Occupational History   Not on file  Tobacco Use   Smoking status: Every Day    Packs/day: 0.50    Types: Cigarettes   Smokeless tobacco: Never  Substance and Sexual Activity   Alcohol use: No   Drug use: No   Sexual activity: Yes    Birth control/protection: None  Other Topics Concern   Not on file  Social History Narrative   Not on file   Social Determinants of Health   Financial Resource Strain: Not on file  Food Insecurity: Not on file  Transportation Needs: Not on file  Physical Activity: Not on file  Stress: Not on file  Social Connections: Not on file  Intimate Partner Violence: Not on file      Review of Systems  Constitutional:  Negative for chills, fatigue and unexpected weight  change.  HENT:  Negative for congestion, postnasal drip, rhinorrhea, sneezing and sore throat.   Respiratory: Negative.  Negative for cough, chest tightness, shortness of breath and wheezing.   Cardiovascular: Negative.  Negative for chest pain and palpitations.  Gastrointestinal: Negative.  Negative for abdominal pain, constipation, diarrhea, nausea and vomiting.  Musculoskeletal:  Positive for arthralgias, back pain and myalgias. Negative for joint swelling and neck pain.  Skin:  Negative for rash.  Neurological: Negative.  Negative for tremors, numbness and headaches.  Psychiatric/Behavioral:  Negative for behavioral problems (Depression), sleep disturbance and suicidal ideas. The patient  is not nervous/anxious.     Vital Signs: BP 130/75 Comment: 169/73  Pulse 90   Temp 97.8 F (36.6 C)   Resp 16   Ht 5\' 4"  (1.626 m)   Wt 212 lb (96.2 kg)   LMP 01/02/2016   SpO2 95%   BMI 36.39 kg/m    Physical Exam Vitals reviewed.  Constitutional:      General: She is not in acute distress.    Appearance: Normal appearance. She is obese. She is not ill-appearing.  HENT:     Head: Normocephalic and atraumatic.  Eyes:     Pupils: Pupils are equal, round, and reactive to light.  Cardiovascular:     Rate and Rhythm: Normal rate and regular rhythm.  Pulmonary:     Effort: Pulmonary effort is normal. No respiratory distress.  Neurological:     Mental Status: She is alert and oriented to person, place, and time.  Psychiatric:        Mood and Affect: Mood normal.        Behavior: Behavior normal.        Assessment/Plan: 1. Essential hypertension, benign BP is better controlled now but patient needs cardiology consult for second opinion as well as for prior diagnosis of heart failure.  - Ambulatory referral to Cardiology  2. Chronic congestive heart failure, unspecified heart failure type (HCC) Referred to cardiology.  - Ambulatory referral to Cardiology  3. Repetitive strain injury of lower back, initial encounter NSAID and muscle relaxant prescribed, also encouraged patient to apply ice to decrease pain and inflammation and give her back time to rest. After a couple weeks, she can start light stretching, exercises were provided to patient. Encouraged patient to apply heat to her low back prior to stretching and performing the exercises provided. Call clinic if pain worsens or additional symptoms develop. - meloxicam (MOBIC) 7.5 MG tablet; Take 1-2 tablets (7.5-15 mg total) by mouth daily.  Dispense: 60 tablet; Refill: 0 - cyclobenzaprine (FLEXERIL) 10 MG tablet; Take 1 tablet (10 mg total) by mouth at bedtime as needed for muscle spasms (and musculoskeletal pain). Take  one tab po qhs for back spasm prn only  Dispense: 30 tablet; Refill: 0   General Counseling: Makinzy verbalizes understanding of the findings of todays visit and agrees with plan of treatment. I have discussed any further diagnostic evaluation that may be needed or ordered today. We also reviewed her medications today. she has been encouraged to call the office with any questions or concerns that should arise related to todays visit.    Orders Placed This Encounter  Procedures   Ambulatory referral to Cardiology    Meds ordered this encounter  Medications   meloxicam (MOBIC) 7.5 MG tablet    Sig: Take 1-2 tablets (7.5-15 mg total) by mouth daily.    Dispense:  60 tablet    Refill:  0   cyclobenzaprine (FLEXERIL)  10 MG tablet    Sig: Take 1 tablet (10 mg total) by mouth at bedtime as needed for muscle spasms (and musculoskeletal pain). Take one tab po qhs for back spasm prn only    Dispense:  30 tablet    Refill:  0    Return in about 4 weeks (around 04/15/2022) for F/U med adjustment , Lucillie Kiesel PCP need work note for yesterday.   Total time spent:30 Minutes Time spent includes review of chart, medications, test results, and follow up plan with the patient.   [place] Controlled Substance Database was reviewed by me.  This patient was seen by [name], FNP-C in collaboration with Dr. Beverely Risen as a part of collaborative care agreement.   Luvada Salamone R. Tedd Sias, MSN, FNP-C Internal medicine

## 2022-04-14 ENCOUNTER — Encounter (INDEPENDENT_AMBULATORY_CARE_PROVIDER_SITE_OTHER): Payer: Self-pay | Admitting: Vascular Surgery

## 2022-04-14 ENCOUNTER — Ambulatory Visit (INDEPENDENT_AMBULATORY_CARE_PROVIDER_SITE_OTHER): Payer: PRIVATE HEALTH INSURANCE | Admitting: Vascular Surgery

## 2022-04-14 VITALS — BP 158/100 | HR 87 | Resp 17 | Ht 64.0 in | Wt 211.6 lb

## 2022-04-14 DIAGNOSIS — I10 Essential (primary) hypertension: Secondary | ICD-10-CM | POA: Diagnosis not present

## 2022-04-14 DIAGNOSIS — M79604 Pain in right leg: Secondary | ICD-10-CM | POA: Diagnosis not present

## 2022-04-14 DIAGNOSIS — M79609 Pain in unspecified limb: Secondary | ICD-10-CM | POA: Insufficient documentation

## 2022-04-14 NOTE — Assessment & Plan Note (Signed)
blood pressure control important in reducing the progression of atherosclerotic disease. On appropriate oral medications.  

## 2022-04-14 NOTE — Progress Notes (Signed)
Patient ID: Kathleen [name], [name]   DOB: 1966/02/08, 56 y.o.   MRN: [id number]  Chief Complaint  Patient presents with   Establish Care    Referred by Dr Kathleen [name]    Kathleen [name] is a 56 y.o. [name].  I am asked to see the patient by Dr. Valetta [name] for evaluation of leg pain and varicose veins of the right lower extremity.  She has been having a lot of trouble with pain radiating down from her knee into the shin area.  She also has pain in the right hip and lower back that radiates down the outside of the leg.  She has very prominent varicosities in the right thigh and these have gotten worse over time.  The left leg has occasional swelling but no real pain and the varicosities on that side are very scattered.  She has had injections on her knee and has been treated for arthritis on her knee without significant improvement in her symptoms.  No ulcers or infection.  No fevers or chills.  No previous history of DVT or superficial thrombophlebitis to her knowledge.       Past Medical History:  Diagnosis Date   Hypertension    Rheumatoid arthritis ([place])     Past Surgical History:  Procedure Laterality Date   CESAREAN SECTION     3 cesarean sections    Family History  Problem Relation Age of Onset   Cancer Mother        [name]   Hypertension Mother   No bleeding or clotting disorders   Social History   Tobacco Use   Smoking status: Every Day    Packs/day: 0.50    Types: Cigarettes   Smokeless tobacco: Never  Substance Use Topics   Alcohol use: No   Drug use: No     Allergies  Allergen Reactions   Bee Venom Anaphylaxis and Swelling   Penicillins     Current Outpatient Medications  Medication Sig Dispense Refill   aspirin EC 81 MG tablet Take 1 tablet (81 mg total) by mouth daily. Swallow whole. 30 tablet 11   carvedilol (COREG) 25 MG tablet Take 25 mg by mouth 2 (two) times daily.     cholecalciferol (VITAMIN D3) 25 MCG (1000 UNIT) tablet Take 1,000  Units by mouth daily.     Collagen-Vitamin C 1000-10 MG TABS Take by mouth.     Cyanocobalamin (VITAMIN B12 PO) Take by mouth.     cyclobenzaprine (FLEXERIL) 10 MG tablet Take 1 tablet (10 mg total) by mouth at bedtime as needed for muscle spasms (and musculoskeletal pain). Take one tab po qhs for back spasm prn only 30 tablet 0   meloxicam (MOBIC) 7.5 MG tablet Take 1-2 tablets (7.5-15 mg total) by mouth daily. 60 tablet 0   Menthol, Topical Analgesic, (BIOFREEZE ROLL-ON EX) Apply topically.     Misc Natural Products (OSTEO BI-FLEX/5-LOXIN ADVANCED PO) Take by mouth.     Multiple Vitamins-Minerals (ONE-A-DAY ENERGY PO) Take 1 tablet by mouth daily.     Olmesartan-amLODIPine-HCTZ 40-5-25 MG TABS Take 1 tablet by mouth daily with supper. 30 tablet 2   Omega-3 Fatty Acids (FISH OIL) 1000 MG CAPS Take by mouth.     rosuvastatin (CRESTOR) 5 MG tablet Take 1 tablet (5 mg total) by mouth every other day. 45 tablet 2   No current facility-administered medications for this visit.      REVIEW OF SYSTEMS (Negative unless checked)  Constitutional: [] Weight loss  []   Fever  [] Chills Cardiac: [] Chest pain   [] Chest pressure   [] Palpitations   [] Shortness of breath when laying flat   [] Shortness of breath at rest   [] Shortness of breath with exertion. Vascular:  [] Pain in legs with walking   [] Pain in legs at rest   [] Pain in legs when laying flat   [] Claudication   [] Pain in feet when walking  [] Pain in feet at rest  [] Pain in feet when laying flat   [] History of DVT   [] Phlebitis   [x] Swelling in legs   [x] Varicose veins   [] Non-healing ulcers Pulmonary:   [] Uses home oxygen   [] Productive cough   [] Hemoptysis   [] Wheeze  [] COPD   [] Asthma Neurologic:  [] Dizziness  [] Blackouts   [] Seizures   [] History of stroke   [] History of TIA  [] Aphasia   [] Temporary blindness   [] Dysphagia   [] Weakness or numbness in arms   [] Weakness or numbness in legs Musculoskeletal:  [x] Arthritis   [] Joint swelling   [x] Joint  pain   [] Low back pain Hematologic:  [] Easy bruising  [] Easy bleeding   [] Hypercoagulable state   [] Anemic  [] Hepatitis Gastrointestinal:  [] Blood in stool   [] Vomiting blood  [] Gastroesophageal reflux/heartburn   [] Abdominal pain Genitourinary:  [] Chronic kidney disease   [] Difficult urination  [] Frequent urination  [] Burning with urination   [] Hematuria Skin:  [] Rashes   [] Ulcers   [] Wounds Psychological:  [] History of anxiety   []  History of major depression.    Physical Exam BP (!) 158/100 (BP Location: Right Arm)   Pulse 87   Resp 17   Ht 5\' 4"  (1.626 m)   Wt 211 lb 9.6 oz (96 kg)   LMP 01/02/2016   BMI 36.32 kg/m  Gen:  WD/WN, NAD Head: [place]/AT, No temporalis wasting.  Ear/Nose/Throat: Hearing grossly intact, dentition good Eyes: Sclera non-icteric. Conjunctiva clear Neck: Supple. Trachea midline Pulmonary:  Good air movement, no use of accessory muscles, respirations not labored.  Cardiac: RRR, No JVD Vascular: Varicosities diffuse and measuring up to 1-2 mm in the right lower extremity        Varicosities scattered and measuring up to 1 mm in the left lower extremity Vessel Right Left  Radial Palpable Palpable                          PT 1+ Palpable 1+ Palpable  DP 1+ Palpable 1+ Palpable   Gastrointestinal: soft, non-tender/non-distended.  Musculoskeletal: M/S 5/5 throughout.   Trace BLE edema Neurologic: Sensation grossly intact in extremities.  Symmetrical.  Speech is fluent.  Psychiatric: Judgment intact, Mood & affect appropriate for pt's clinical situation. Dermatologic: No rashes or ulcers noted.  No cellulitis or open wounds.    Radiology No results found.  Labs Recent Results (from the past 2160 hour(s))  POCT glycosylated hemoglobin (Hb A1C)     Status: Abnormal   Collection Time: 03/02/22 12:03 PM  Result Value Ref Range   Hemoglobin A1C 6.5 (A) 4.0 - 5.6 %   HbA1c POC (<> result, manual entry)     HbA1c, POC (prediabetic range)     HbA1c, POC  (controlled diabetic range)      Assessment/Plan:  Pain in limb  Recommend:  The patient has atypical pain symptoms for pure atherosclerotic disease. However, on physical exam there is evidence of vascular disease, given the diminished pulses and the edema associated with venous changes of the legs.  Further investigation of the patient's vascular disease  is necessary to determine the relationship of the patient's lower extremity symptoms and the degree of vascular disease.  Noninvasive studies of the will be obtained and the patient will follow up with me to review these studies.  I suspect the patient is c/o pseudoclaudication.  Patient should have an evaluation of his LS spine which I defer to either the primary service or the Spine service.  The patient should continue walking and begin a more formal exercise program. The patient should continue his antiplatelet therapy and aggressive treatment of the lipid abnormalities.   Essential hypertension, benign blood pressure control important in reducing the progression of atherosclerotic disease. On appropriate oral medications.       Festus Barren 04/14/2022, 1:32 PM   This note was created with Dragon medical transcription system.  Any errors from dictation are unintentional.

## 2022-04-14 NOTE — Patient Instructions (Signed)
Chronic Venous Insufficiency Chronic venous insufficiency is a condition where the leg veins cannot effectively pump blood from the legs to the heart. This happens when the vein walls are either stretched, weakened, or damaged, or when the valves inside the vein are damaged. With the right treatment, you should be able to continue with an active life. This condition is also called venous stasis. What are the causes? Common causes of this condition include: High blood pressure inside the veins (venous hypertension). Sitting or standing too long, causing increased blood pressure in the leg veins. A blood clot that blocks blood flow in a vein (deep vein thrombosis, DVT). Inflammation of a vein (phlebitis) that causes a blood clot to form. Tumors in the pelvis that cause blood to back up. What increases the risk? The following factors may make you more likely to develop this condition: Having a family history of this condition. Obesity. Pregnancy. Living without enough regular physical activity or exercise (sedentary lifestyle). Smoking. Having a job that requires long periods of standing or sitting in one place. Being a certain age. Women in their 40s and 50s and men in their 70s are more likely to develop this condition. What are the signs or symptoms? Symptoms of this condition include: Veins that are enlarged, bulging, or twisted (varicose veins). Skin breakdown or ulcers. Reddened skin or dark discoloration of skin on the leg between the knee and ankle. Brown, smooth, tight, and painful skin just above the ankle, usually on the inside of the leg (lipodermatosclerosis). Swelling of the legs. How is this diagnosed? This condition may be diagnosed based on: Your medical history. A physical exam. Tests, such as: A procedure that creates an image of a blood vessel and nearby organs and provides information about blood flow through the blood vessel (duplex ultrasound). A procedure that  tests blood flow (plethysmography). A procedure that looks at the veins using X-ray and dye (venogram). How is this treated? The goals of treatment are to help you return to an active life and to minimize pain or disability. Treatment depends on the severity of your condition, and it may include: Wearing compression stockings. These can help relieve symptoms and help prevent your condition from getting worse. However, they do not cure the condition. Sclerotherapy. This procedure involves an injection of a solution that shrinks damaged veins. Surgery. This may involve: Removing a diseased vein (vein stripping). Cutting off blood flow through the vein (laser ablation surgery). Repairing or reconstructing a valve within the affected vein. Follow these instructions at home:     Wear compression stockings as told by your health care provider. These stockings help to prevent blood clots and reduce swelling in your legs. Take over-the-counter and prescription medicines only as told by your health care provider. Stay active by exercising, walking, or doing different activities. Ask your health care provider what activities are safe for you and how much exercise you need. Drink enough fluid to keep your urine pale yellow. Do not use any products that contain nicotine or tobacco, such as cigarettes, e-cigarettes, and chewing tobacco. If you need help quitting, ask your health care provider. Keep all follow-up visits as told by your health care provider. This is important. Contact a health care provider if you: Have redness, swelling, or more pain in the affected area. See a red streak or line that goes up or down from the affected area. Have skin breakdown or skin loss in the affected area, even if the breakdown is small. Get   an injury in the affected area. Get help right away if: You get an injury and an open wound in the affected area. You have: Severe pain that does not get better with  medicine. Sudden numbness or weakness in the foot or ankle below the affected area. Trouble moving your foot or ankle. A fever. Worse or persistent symptoms. Chest pain. Shortness of breath. Summary Chronic venous insufficiency is a condition where the leg veins cannot effectively pump blood from the legs to the heart. Chronic venous insufficiency occurs when the vein walls become stretched, weakened, or damaged, or when valves within the vein are damaged. Treatment depends on how severe your condition is. It often involves wearing compression stockings and may involve having a procedure. Make sure you stay active by exercising, walking, or doing different activities. Ask your health care provider what activities are safe for you and how much exercise you need. This information is not intended to replace advice given to you by your health care provider. Make sure you discuss any questions you have with your health care provider. Document Revised: 09/17/2020 Document Reviewed: 09/17/2020 Elsevier Patient Education  2023 [employer]  

## 2022-04-14 NOTE — Assessment & Plan Note (Signed)
Recommend:  The patient has atypical pain symptoms for pure atherosclerotic disease. However, on physical exam there is evidence of vascular disease, given the diminished pulses and the edema associated with venous changes of the legs.  Further investigation of the patient's vascular disease is necessary to determine the relationship of the patient's lower extremity symptoms and the degree of vascular disease.  Noninvasive studies of the will be obtained and the patient will follow up with me to review these studies.  I suspect the patient is c/o pseudoclaudication.  Patient should have an evaluation of his LS spine which I defer to either the primary service or the Spine service.  The patient should continue walking and begin a more formal exercise program. The patient should continue his antiplatelet therapy and aggressive treatment of the lipid abnormalities. 

## 2022-04-15 ENCOUNTER — Encounter: Payer: Self-pay | Admitting: Nurse Practitioner

## 2022-04-15 ENCOUNTER — Ambulatory Visit: Payer: PRIVATE HEALTH INSURANCE | Admitting: Nurse Practitioner

## 2022-04-15 VITALS — BP 130/80 | HR 81 | Temp 95.3°F | Resp 16 | Ht 64.0 in | Wt 215.4 lb

## 2022-04-15 DIAGNOSIS — I509 Heart failure, unspecified: Secondary | ICD-10-CM

## 2022-04-15 DIAGNOSIS — I10 Essential (primary) hypertension: Secondary | ICD-10-CM | POA: Diagnosis not present

## 2022-04-15 DIAGNOSIS — I83899 Varicose veins of unspecified lower extremities with other complications: Secondary | ICD-10-CM | POA: Diagnosis not present

## 2022-04-15 NOTE — Progress Notes (Signed)
[facility] [address] [place], [place] [address]  Internal MEDICINE  Office Visit Note  Patient Name: Kathleen [name]  [id number]  [id number]  Date of Service: 04/15/2022  Chief Complaint  Patient presents with   Follow-up    Follow up med adjustment    Hypertension   Quality Metric Gaps    pap    HPI Amayrani presents for a follow up visit for hypertension and medication dose adjustment.  --BP is improved with current dose of combo BP med.  --was referred to Dr. Kirke Corin for CHF and will be seeing him soon for new patient appointment.  --saw Dr. Wyn Quaker yesterday for spider veins and varicose veins of both legs.    Current Medication: Outpatient Encounter Medications as of 04/15/2022  Medication Sig   aspirin EC 81 MG tablet Take 1 tablet (81 mg total) by mouth daily. Swallow whole.   carvedilol (COREG) 25 MG tablet Take 25 mg by mouth 2 (two) times daily.   cholecalciferol (VITAMIN D3) 25 MCG (1000 UNIT) tablet Take 1,000 Units by mouth daily.   Collagen-Vitamin C 1000-10 MG TABS Take 1 tablet by mouth daily.   Cyanocobalamin (VITAMIN B12 PO) Take 1 tablet by mouth daily.   Menthol, Topical Analgesic, (BIOFREEZE ROLL-ON EX) Apply topically as needed.   Misc Natural Products (OSTEO BI-FLEX/5-LOXIN ADVANCED PO) Take 2 tablets by mouth daily.   Multiple Vitamins-Minerals (ONE-A-DAY ENERGY PO) Take 1 tablet by mouth daily.   Omega-3 Fatty Acids (FISH OIL) 1000 MG CAPS Take 1 capsule by mouth at bedtime.   rosuvastatin (CRESTOR) 5 MG tablet Take 1 tablet (5 mg total) by mouth every other day.   [DISCONTINUED] cyclobenzaprine (FLEXERIL) 10 MG tablet Take 1 tablet (10 mg total) by mouth at bedtime as needed for muscle spasms (and musculoskeletal pain). Take one tab po qhs for back spasm prn only   [DISCONTINUED] meloxicam (MOBIC) 7.5 MG tablet Take 1-2 tablets (7.5-15 mg total) by mouth daily.   [DISCONTINUED] Olmesartan-amLODIPine-HCTZ 40-5-25 MG TABS Take 1 tablet by  mouth daily with supper.   No facility-administered encounter medications on file as of 04/15/2022.    Surgical History: Past Surgical History:  Procedure Laterality Date   CESAREAN SECTION     3 cesarean sections    Medical History: Past Medical History:  Diagnosis Date   Hypertension    Rheumatoid arthritis ([facility])     Family History: Family History  Problem Relation Age of Onset   Cancer Mother        Female   Hypertension Mother     Social History   Socioeconomic History   Marital status: Married    Spouse name: Not on file   Number of children: Not on file   Years of education: Not on file   Highest education level: Not on file  Occupational History   Not on file  Tobacco Use   Smoking status: Every Day    Packs/day: 0.50    Types: Cigarettes   Smokeless tobacco: Never  Vaping Use   Vaping Use: Former  Substance and Sexual Activity   Alcohol use: No   Drug use: No   Sexual activity: Yes    Birth control/protection: None  Other Topics Concern   Not on file  Social History Narrative   Not on file   Social Determinants of Health   Financial Resource Strain: Not on file  Food Insecurity: Not on file  Transportation Needs: Not on file  Physical Activity: Not  on file  Stress: Not on file  Social Connections: Not on file  Intimate Partner Violence: Not on file      Review of Systems  Constitutional:  Negative for chills, fatigue and unexpected weight change.  HENT:  Negative for congestion, postnasal drip, rhinorrhea, sneezing and sore throat.   Respiratory: Negative.  Negative for cough, chest tightness, shortness of breath and wheezing.   Cardiovascular: Negative.  Negative for chest pain and palpitations.  Gastrointestinal: Negative.  Negative for abdominal pain, constipation, diarrhea, nausea and vomiting.  Musculoskeletal:  Positive for arthralgias, back pain and myalgias. Negative for joint swelling and neck pain.  Skin:  Negative for rash.   Neurological: Negative.  Negative for tremors, numbness and headaches.  Psychiatric/Behavioral:  Negative for behavioral problems (Depression), sleep disturbance and suicidal ideas. The patient is not nervous/anxious.     Vital Signs: BP 130/80 Comment: 136/88  Pulse 81   Temp (!) 95.3 F (35.2 C)   Resp 16   Ht 5\' 4"  (1.626 m)   Wt 215 lb 6.4 oz (97.7 kg)   LMP 01/02/2016   BMI 36.97 kg/m    Physical Exam Vitals reviewed.  Constitutional:      General: She is not in acute distress.    Appearance: Normal appearance. She is obese. She is not ill-appearing.  HENT:     Head: Normocephalic and atraumatic.  Eyes:     Pupils: Pupils are equal, round, and reactive to light.  Cardiovascular:     Rate and Rhythm: Normal rate and regular rhythm.  Pulmonary:     Effort: Pulmonary effort is normal. No respiratory distress.  Neurological:     Mental Status: She is alert and oriented to person, place, and time.  Psychiatric:        Mood and Affect: Mood normal.        Behavior: Behavior normal.        Assessment/Plan: 1. Essential hypertension, benign Improved and stable with current carvedilol dose 25 mg twice daily and amlodipine-olmesartan-HCTZ 40-5-25 mg once daily with supper.   2. Chronic congestive heart failure, unspecified heart failure type ([place]) Schedule to see Dr. Fletcher Anon soon for new patient appointment  3. Symptomatic spider varicose vein Saw Dr. Lucky Cowboy yesterday, interested in surgical treatment to help with varicose and spider veins.    General Counseling: devony mcgrady understanding of the findings of todays visit and agrees with plan of treatment. I have discussed any further diagnostic evaluation that may be needed or ordered today. We also reviewed her medications today. she has been encouraged to call the office with any questions or concerns that should arise related to todays visit.    No orders of the defined types were placed in this  encounter.   No orders of the defined types were placed in this encounter.   Return for previously scheduled, Aneeka Bowden PCP in february, has appt with Dr. Fletcher Anon coming up .   Total time spent:30 Minutes Time spent includes review of chart, medications, test results, and follow up plan with the patient.   [place] Controlled Substance Database was reviewed by me.  This patient was seen by Jonetta Osgood, FNP-C in collaboration with Dr. Clayborn Bigness as a part of collaborative care agreement.   Kenzly Rogoff R. Valetta Fuller, MSN, FNP-C Internal medicine

## 2022-04-26 ENCOUNTER — Encounter: Payer: Self-pay | Admitting: Nurse Practitioner

## 2022-05-12 ENCOUNTER — Ambulatory Visit (INDEPENDENT_AMBULATORY_CARE_PROVIDER_SITE_OTHER): Payer: PRIVATE HEALTH INSURANCE

## 2022-05-12 ENCOUNTER — Encounter (INDEPENDENT_AMBULATORY_CARE_PROVIDER_SITE_OTHER): Payer: Self-pay | Admitting: Nurse Practitioner

## 2022-05-12 ENCOUNTER — Other Ambulatory Visit: Payer: Self-pay | Admitting: Nurse Practitioner

## 2022-05-12 ENCOUNTER — Ambulatory Visit (INDEPENDENT_AMBULATORY_CARE_PROVIDER_SITE_OTHER): Payer: PRIVATE HEALTH INSURANCE | Admitting: Nurse Practitioner

## 2022-05-12 VITALS — BP 143/82 | HR 72 | Resp 18 | Ht 64.0 in | Wt 213.0 lb

## 2022-05-12 DIAGNOSIS — M79604 Pain in right leg: Secondary | ICD-10-CM

## 2022-05-12 DIAGNOSIS — F17200 Nicotine dependence, unspecified, uncomplicated: Secondary | ICD-10-CM

## 2022-05-12 DIAGNOSIS — I10 Essential (primary) hypertension: Secondary | ICD-10-CM

## 2022-05-12 DIAGNOSIS — I83811 Varicose veins of right lower extremities with pain: Secondary | ICD-10-CM | POA: Diagnosis not present

## 2022-05-12 DIAGNOSIS — I739 Peripheral vascular disease, unspecified: Secondary | ICD-10-CM

## 2022-05-12 NOTE — Progress Notes (Signed)
Subjective:    Patient ID: Kathleen [name], female    DOB: 13-Mar-1966, 56 y.o.   MRN: [id number] No chief complaint on file.   Patient returns to clinic today for follow up Ultrasound with ABI's bilaterally related to history of right hip, thigh, and knee pain that radiated to her shin. Patient states she works 12 to 16 hour shifts on her feet and during the shift she must wear compression socks and a back brace when the pain becomes intense. The pain in centered around her right knee where she states her knee will become swollen with intense pain. She also has varicose vein cluster on the outside lateral aspect of her knee. She states after long working days this becomes very painful as well. Patient states today her primary doctor sent her here today to make sure she doesn't have any "blood clots in her right leg."    "Today noninvasive studies show an ABI of 1.11 bilaterally.  The right TBI is 0.56 with a left to 0.50.  There are biphasic tibial artery waveforms bilaterally with slightly diminished toe waveforms bilaterally.  The patient also underwent venous reflux study which showed no evidence of DVT or superficial thrombophlebitis bilaterally.  No evidence of deep venous insufficiency bilaterally.  No evidence of superficial venous reflux in the right lower extremity however the left lower extremity does have reflux in the great saphenous vein.    Review of Systems  Constitutional: Negative.   HENT: Negative.    Eyes: Negative.   Respiratory: Negative.    Cardiovascular: Negative.   Gastrointestinal: Negative.   Endocrine: Negative.   Genitourinary: Negative.   Musculoskeletal:  Positive for back pain, gait problem and joint swelling.  Skin: Negative.   Allergic/Immunologic: Negative.   Hematological: Negative.   Psychiatric/Behavioral: Negative.         Objective:   Physical Exam Constitutional:      Appearance: Normal appearance.  HENT:     Head: Normocephalic.   Cardiovascular:     Rate and Rhythm: Normal rate and regular rhythm.     Pulses: Normal pulses.  Pulmonary:     Effort: Pulmonary effort is normal.  Abdominal:     General: Bowel sounds are normal.  Musculoskeletal:        General: Swelling present.     Right lower leg: Edema present.  Skin:    General: Skin is warm and dry.     Capillary Refill: Capillary refill takes 2 to 3 seconds.  Neurological:     General: No focal deficit present.     Mental Status: She is alert and oriented to person, place, and time.  Psychiatric:        Mood and Affect: Mood normal.     BP (!) 143/82 (BP Location: Right Arm)   Pulse 72   Resp 18   Ht 5\' 4"  (1.626 m)   Wt 213 lb (96.6 kg)   LMP 01/02/2016   BMI 36.56 kg/m   Past Medical History:  Diagnosis Date   Hypertension    Rheumatoid arthritis ([place])     Social History   Socioeconomic History   Marital status: Married    Spouse name: Not on file   Number of children: Not on file   Years of education: Not on file   Highest education level: Not on file  Occupational History   Not on file  Tobacco Use   Smoking status: Every Day    Packs/day: 0.50  Types: Cigarettes   Smokeless tobacco: Never  Substance and Sexual Activity   Alcohol use: No   Drug use: No   Sexual activity: Yes    Birth control/protection: None  Other Topics Concern   Not on file  Social History Narrative   Not on file   Social Determinants of Health   Financial Resource Strain: Not on file  Food Insecurity: Not on file  Transportation Needs: Not on file  Physical Activity: Not on file  Stress: Not on file  Social Connections: Not on file  Intimate Partner Violence: Not on file    Past Surgical History:  Procedure Laterality Date   CESAREAN SECTION     3 cesarean sections    Family History  Problem Relation Age of Onset   Cancer Mother        Female   Hypertension Mother     Allergies  Allergen Reactions   Bee Venom Anaphylaxis and  Swelling   Penicillins        Latest Ref Rng & Units 03/28/2021    1:39 PM 04/13/2014   12:59 PM 02/05/2014    5:20 AM  CBC  WBC 4.0 - 10.5 K/uL 6.5  4.5  7.9   Hemoglobin 12.0 - 15.0 g/dL 12.7  12.6  10.8   Hematocrit 36.0 - 46.0 % 38.1  37.0  31.7   Platelets 150 - 400 K/uL 287  377  347       CMP     Component Value Date/Time   NA 144 07/24/2021 1323   K 4.0 07/24/2021 1323   CL 106 07/24/2021 1323   CO2 23 07/24/2021 1323   GLUCOSE 100 (H) 07/24/2021 1323   GLUCOSE 103 (H) 03/28/2021 1734   BUN 25 (H) 07/24/2021 1323   CREATININE 0.91 07/24/2021 1323   CREATININE 0.72 04/13/2014 1259   CALCIUM 10.4 (H) 07/24/2021 1323   PROT 7.0 07/24/2021 1323   ALBUMIN 4.2 07/24/2021 1323   AST 17 07/24/2021 1323   ALT 16 07/24/2021 1323   ALKPHOS 90 07/24/2021 1323   BILITOT 0.3 07/24/2021 1323   GFRNONAA >60 03/28/2021 1734   GFRAA >90 02/05/2014 0520     No results found.     Assessment & Plan:   1. Varicose veins of right lower extremity with pain Recommend:  Patient should undergo injection sclerotherapy to treat the painful varicosities.  While the patient's varicosities are not the entirety of the cause of knee pain.  The inflammation caused by these can certainly exacerbate it.  Based on this sclerotherapy would be in the patient's best interest.  The risks, benefits and alternative therapies were reviewed in detail with the patient.  All questions were answered.  The patient agrees to proceed with sclerotherapy at their convenience.  The patient will continue wearing the graduated compression stockings with daily leg elevation and using the over-the-counter pain medications to treat her symptoms.     2. Pain of right lower extremity Based on the patient's description of pain, it is largely been driving from her knee as well as her lower back area.  The patient's noninvasive studies would not explain her current set of symptoms.  Patient is advised to continue to  follow with PCP for further work-up of knee discomfort and pain.  She notes that she does have a pending referral to [facility].  3. Essential hypertension, benign Continue antihypertensive medications as already ordered, these medications have been reviewed and there are no changes at this time.  4. Tobacco use disorder Smoking cessation was discussed, 3-10 minutes spent on this topic specifically   5. PAD (peripheral artery disease) (HCC) Currently the patient had normal ABIs but slightly diminished TBI's with biphasic waveforms.  Given the patient's continued smoking history it would be prudent to continue to follow her on an annual basis for known peripheral arterial disease.  Patient is advised to follow-up sooner if she begins to develop worsening symptoms such as claudication, rest pain or lower extremity ulcerations.   Current Outpatient Medications on File Prior to Visit  Medication Sig Dispense Refill   aspirin EC 81 MG tablet Take 1 tablet (81 mg total) by mouth daily. Swallow whole. 30 tablet 11   carvedilol (COREG) 25 MG tablet Take 25 mg by mouth 2 (two) times daily.     cholecalciferol (VITAMIN D3) 25 MCG (1000 UNIT) tablet Take 1,000 Units by mouth daily.     Collagen-Vitamin C 1000-10 MG TABS Take by mouth.     Cyanocobalamin (VITAMIN B12 PO) Take by mouth.     cyclobenzaprine (FLEXERIL) 10 MG tablet Take 1 tablet (10 mg total) by mouth at bedtime as needed for muscle spasms (and musculoskeletal pain). Take one tab po qhs for back spasm prn only 30 tablet 0   meloxicam (MOBIC) 7.5 MG tablet Take 1-2 tablets (7.5-15 mg total) by mouth daily. 60 tablet 0   Menthol, Topical Analgesic, (BIOFREEZE ROLL-ON EX) Apply topically.     Misc Natural Products (OSTEO BI-FLEX/5-LOXIN ADVANCED PO) Take by mouth.     Multiple Vitamins-Minerals (ONE-A-DAY ENERGY PO) Take 1 tablet by mouth daily.     Olmesartan-amLODIPine-HCTZ 40-5-25 MG TABS Take 1 tablet by mouth daily with supper. 30  tablet 2   Omega-3 Fatty Acids (FISH OIL) 1000 MG CAPS Take by mouth.     rosuvastatin (CRESTOR) 5 MG tablet Take 1 tablet (5 mg total) by mouth every other day. 45 tablet 2   No current facility-administered medications on file prior to visit.    There are no Patient Instructions on file for this visit. No follow-ups on file.   Kris Hartmann, NP

## 2022-05-19 ENCOUNTER — Encounter: Payer: Self-pay | Admitting: Nurse Practitioner

## 2022-05-21 ENCOUNTER — Ambulatory Visit: Payer: PRIVATE HEALTH INSURANCE | Attending: Cardiovascular Disease | Admitting: Cardiovascular Disease

## 2022-05-21 ENCOUNTER — Encounter: Payer: Self-pay | Admitting: Cardiovascular Disease

## 2022-05-21 VITALS — BP 120/80 | HR 78 | Ht 64.0 in | Wt 213.0 lb

## 2022-05-21 DIAGNOSIS — I119 Hypertensive heart disease without heart failure: Secondary | ICD-10-CM

## 2022-05-21 DIAGNOSIS — G4733 Obstructive sleep apnea (adult) (pediatric): Secondary | ICD-10-CM | POA: Diagnosis not present

## 2022-05-21 DIAGNOSIS — Z720 Tobacco use: Secondary | ICD-10-CM

## 2022-05-21 DIAGNOSIS — I10 Essential (primary) hypertension: Secondary | ICD-10-CM | POA: Diagnosis not present

## 2022-05-21 DIAGNOSIS — E785 Hyperlipidemia, unspecified: Secondary | ICD-10-CM | POA: Diagnosis not present

## 2022-05-21 NOTE — Progress Notes (Signed)
Cardiology Office Note   Date:  05/21/2022   ID:  HUGH GARROW, DOB [birth year], MRN [id number]  PCP:  Jonetta Osgood, NP  Cardiologist:   Kathlyn Sacramento, MD   Chief Complaint  Patient presents with   NEW patient-CHF evaluation      History of Present Illness: DALYNN JHAVERI is a 56 y.o. female who was referred by Jonetta Osgood for evaluation of congestive heart failure. She has known history of essential hypertension, hyperlipidemia, chronic back pain, sleep apnea not on CPAP, tobacco use and obesity. She has difficult to control hypertension and her blood pressure was extremely high in early 2023.  She reports that her systolic blood pressure was greater than 200 and she was having frequent episodes of chest pain and shortness of breath.  She had an echocardiogram done in January which showed normal LV systolic function with mild left ventricular hypertrophy and impaired relaxation.  The study is not available for review.  She was placed on multiple antihypertensive medications which were gradually adjusted.  She saw Dr. Terrence Dupont but these records are not available.  She reports having a stress test with him. She reports overall improvement in shortness of breath and chest pain.  She works as a third Airline pilot at [employer]  Her job is very stressful.  In addition, she went through divorce recently and takes care of of a special needs 42 year old son.  She does not use CPAP for sleep apnea.   Past Medical History:  Diagnosis Date   Hypertension    Rheumatoid arthritis ([place])     Past Surgical History:  Procedure Laterality Date   CESAREAN SECTION     3 cesarean sections     Current Outpatient Medications  Medication Sig Dispense Refill   aspirin EC 81 MG tablet Take 1 tablet (81 mg total) by mouth daily. Swallow whole. 30 tablet 11   carvedilol (COREG) 25 MG tablet Take 25 mg by mouth 2 (two) times daily.     cholecalciferol (VITAMIN D3) 25 MCG (1000 UNIT)  tablet Take 1,000 Units by mouth daily.     Collagen-Vitamin C 1000-10 MG TABS Take 1 tablet by mouth daily.     Cyanocobalamin (VITAMIN B12 PO) Take 1 tablet by mouth daily.     cyclobenzaprine (FLEXERIL) 10 MG tablet Take 1 tablet (10 mg total) by mouth at bedtime as needed for muscle spasms (and musculoskeletal pain). Take one tab po qhs for back spasm prn only 30 tablet 0   meloxicam (MOBIC) 7.5 MG tablet Take 1-2 tablets (7.5-15 mg total) by mouth daily. 60 tablet 0   Menthol, Topical Analgesic, (BIOFREEZE ROLL-ON EX) Apply topically as needed.     Misc Natural Products (OSTEO BI-FLEX/5-LOXIN ADVANCED PO) Take 2 tablets by mouth daily.     Multiple Vitamins-Minerals (ONE-A-DAY ENERGY PO) Take 1 tablet by mouth daily.     Olmesartan-amLODIPine-HCTZ 40-5-25 MG TABS TAKE 1 TABLET BY MOUTH ONCE DAILY WITH SUPPER 30 tablet 0   Omega-3 Fatty Acids (FISH OIL) 1000 MG CAPS Take 1 capsule by mouth at bedtime.     rosuvastatin (CRESTOR) 5 MG tablet Take 1 tablet (5 mg total) by mouth every other day. 45 tablet 2   No current facility-administered medications for this visit.    Allergies:   Bee venom and Penicillins    Social History:  The patient  reports that she has been smoking cigarettes. She has been smoking an average of .5 packs per day. She  has never used smokeless tobacco. She reports that she does not drink alcohol and does not use drugs.   Family History:  The patient's family history includes Cancer in her mother; Hypertension in her mother.    ROS:  Please see the history of present illness.   Otherwise, review of systems are positive for none.   All other systems are reviewed and negative.    PHYSICAL EXAM: VS:  BP 120/80 (BP Location: Right Arm, Patient Position: Sitting, Cuff Size: Large)   Pulse 78   Ht 5\' 4"  (1.626 m)   Wt 213 lb (96.6 kg)   LMP 01/02/2016   SpO2 94%   BMI 36.56 kg/m  , BMI Body mass index is 36.56 kg/m. GEN: Well nourished, well developed, in no  acute distress  HEENT: normal  Neck: no JVD, carotid bruits, or masses Cardiac: RRR; no murmurs, rubs, or gallops,no edema  Respiratory:  clear to auscultation bilaterally, normal work of breathing GI: soft, nontender, nondistended, + BS MS: no deformity or atrophy  Skin: warm and dry, no rash Neuro:  Strength and sensation are intact Psych: euthymic mood, full affect   EKG:  EKG is ordered today. The ekg ordered today demonstrates normal sinus rhythm with nonspecific ST changes.   Recent Labs: 07/24/2021: ALT 16; BUN 25; Creatinine, Ser 0.91; Potassium 4.0; Sodium 144; TSH 0.653    Lipid Panel    Component Value Date/Time   CHOL 194 07/24/2021 1323   TRIG 115 07/24/2021 1323   HDL 51 07/24/2021 1323   CHOLHDL 3.8 07/24/2021 1323   CHOLHDL 4.0 04/13/2014 1259   VLDL 17 04/13/2014 1259   LDLCALC 122 (H) 07/24/2021 1323      Wt Readings from Last 3 Encounters:  05/21/22 213 lb (96.6 kg)  05/12/22 213 lb (96.6 kg)  04/15/22 215 lb 6.4 oz (97.7 kg)          05/21/2022    9:56 AM  PAD Screen  Previous PAD dx? No  Previous surgical procedure? Yes  Pain with walking? No  Feet/toe relief with dangling? No  Painful, non-healing ulcers? No  Extremities discolored? No      ASSESSMENT AND PLAN:  1.  Hypertensive heart disease without heart failure: The patient's symptoms improved overall after controlling her blood pressure.  There is no evidence of volume overload at the present time and thus no need for a diuretic.  2.  Atypical chest pain: Her symptoms were mostly in the setting of uncontrolled hypertension.  She reports improvement in symptoms.  I will request her cardiac records from Dr. Terrence Dupont.  3.  Essential hypertension: Blood pressure is controlled but she is requiring 4 antihypertensive medications.  I discussed with her the importance of low-sodium diet and also discussed the importance of treating her sleep apnea which might be contributing to uncontrolled  hypertension. The patient might be a candidate for renal denervation if this becomes FDA approved in the near future.  4.  Hyperlipidemia: LDL was 120.  She ran out of rosuvastatin and this was refilled.  5.  Tobacco use: I discussed with her the importance of smoking cessation.    Disposition:   FU with me in 6 months  Signed,  Kathlyn Sacramento, MD  05/21/2022 10:19 AM    [place]

## 2022-05-21 NOTE — Patient Instructions (Signed)
Medication Instructions:  Your physician recommends that you continue on your current medications as directed. Please refer to the Current Medication list given to you today.  *If you need a refill on your cardiac medications before your next appointment, please call your pharmacy*   Lab Work: None ordered If you have labs (blood work) drawn today and your tests are completely normal, you will receive your results only by: MyChart Message (if you have MyChart) OR A paper copy in the mail If you have any lab test that is abnormal or we need to change your treatment, we will call you to review the results.   Follow-Up: At [facility], you and your health needs are our priority.  As part of our continuing mission to provide you with exceptional heart care, we have created designated Provider Care Teams.  These Care Teams include your primary Cardiologist (physician) and Advanced Practice Providers (APPs -  Physician Assistants and Nurse Practitioners) who all work together to provide you with the care you need, when you need it.  We recommend signing up for the patient portal called "MyChart".  Sign up information is provided on this After Visit Summary.  MyChart is used to connect with patients for Virtual Visits (Telemedicine).  Patients are able to view lab/test results, encounter notes, upcoming appointments, etc.  Non-urgent messages can be sent to your provider as well.   To learn more about what you can do with MyChart, go to [email or web address].    Your next appointment:   6 month(s)  The format for your next appointment:   In Person  Provider:   [name], MD   Other Instructions Steps to Quit Smoking Smoking tobacco is the leading cause of preventable death. It can affect almost every organ in the body. Smoking puts you and those around you at risk for developing many serious chronic diseases. Quitting smoking can be very challenging. Do not get discouraged if  you are not successful the first time. Some people need to make many attempts to quit before they achieve long-term success. Do your best to stick to your quit plan, and talk with your health care provider if you have any questions or concerns. How do I get ready to quit? When you decide to quit smoking, create a plan to help you succeed. Before you quit: Pick a date to quit. Set a date within the next 2 weeks to give you time to prepare. Write down the reasons why you are quitting. Keep this list in places where you will see it often. Tell your family, friends, and co-workers that you are quitting. Support from people you are close to can make quitting easier. Talk with your health care provider about your options for quitting smoking. Find out what treatment options are covered by your health insurance. Identify people, places, things, and activities that make you want to smoke (triggers). Avoid them. What first steps can I take to quit smoking? Throw away all cigarettes at home, at work, and in your car. Throw away smoking accessories, such as Set designer. Clean your car. Make sure to empty the ashtray. Clean your home, including curtains and carpets. What strategies can I use to quit smoking? Talk with your health care provider about combining strategies, such as taking medicines while you are also receiving in-person counseling. Using these two strategies together makes you more likely to succeed in quitting than if you used either strategy on its own. If you are pregnant or  breastfeeding, talk with your health care provider about finding counseling or other support strategies to quit smoking. Do not take medicine to help you quit smoking unless your health care provider tells you to. Quit right away Quit smoking completely, instead of gradually reducing how much you smoke over a period of time. Stopping smoking right away may be more successful than gradually quitting. Attend  in-person counseling to help you build problem-solving skills. You are more likely to succeed in quitting if you attend counseling sessions regularly. Even short sessions of 10 minutes can be effective. Take medicine You may take medicines to help you quit smoking. Some medicines require a prescription. You can also purchase over-the-counter medicines. Medicines may have nicotine in them to replace the nicotine in cigarettes. Medicines may: Help to stop cravings. Help to relieve withdrawal symptoms. Your health care provider may recommend: Nicotine patches, gum, or lozenges. Nicotine inhalers or sprays. Non-nicotine medicine that you take by mouth. Find resources Find resources and support systems that can help you quit smoking and remain smoke-free after you quit. These resources are most helpful when you use them often. They include: Online chats with a Veterinary surgeon. Telephone quitlines. Printed Materials engineer. Support groups or group counseling. Text messaging programs. Mobile phone apps or applications. Use apps that can help you stick to your quit plan by providing reminders, tips, and encouragement. Examples of free services include Quit Guide from the CDC and smokefree.gov  What can I do to make it easier to quit?  Reach out to your family and friends for support and encouragement. Call telephone quitlines, such as 1-800-QUIT-NOW, reach out to support groups, or work with a counselor for support. Ask people who smoke to avoid smoking around you. Avoid places that trigger you to smoke, such as bars, parties, or smoke-break areas at work. Spend time with people who do not smoke. Lessen the stress in your life. Stress can be a smoking trigger for some people. To lessen stress, try: Exercising regularly. Doing deep-breathing exercises. Doing yoga. Meditating. What benefits will I see if I quit smoking? Over time, you should start to see positive results, such as: Improved sense of  smell and taste. Decreased coughing and sore throat. Slower heart rate. Lower blood pressure. Clearer and healthier skin. The ability to breathe more easily. Fewer sick days. Summary Quitting smoking can be very challenging. Do not get discouraged if you are not successful the first time. Some people need to make many attempts to quit before they achieve long-term success. When you decide to quit smoking, create a plan to help you succeed. Quit smoking right away, not slowly over a period of time. Find resources and support systems that can help you quit smoking and remain smoke-free after you quit. This information is not intended to replace advice given to you by your health care provider. Make sure you discuss any questions you have with your health care provider. Document Revised: 06/27/2021 Document Reviewed: 06/27/2021 Elsevier Patient Education  2023 Elsevier Inc.  Low-Sodium Eating Plan Sodium, which is an element that makes up salt, helps you maintain a healthy balance of fluids in your body. Too much sodium can increase your blood pressure and cause fluid and waste to be held in your body. Your health care provider or dietitian may recommend following this plan if you have high blood pressure (hypertension), kidney disease, liver disease, or heart failure. Eating less sodium can help lower your blood pressure, reduce swelling, and protect your heart, liver, and  kidneys. What are tips for following this plan? Reading food labels The Nutrition Facts label lists the amount of sodium in one serving of the food. If you eat more than one serving, you must multiply the listed amount of sodium by the number of servings. Choose foods with less than 140 mg of sodium per serving. Avoid foods with 300 mg of sodium or more per serving. Shopping  Look for lower-sodium products, often labeled as "low-sodium" or "no salt added." Always check the sodium content, even if foods are labeled as  "unsalted" or "no salt added." Buy fresh foods. Avoid canned foods and pre-made or frozen meals. Avoid canned, cured, or processed meats. Buy breads that have less than 80 mg of sodium per slice. Cooking  Eat more home-cooked food and less restaurant, buffet, and fast food. Avoid adding salt when cooking. Use salt-free seasonings or herbs instead of table salt or sea salt. Check with your health care provider or pharmacist before using salt substitutes. Cook with plant-based oils, such as canola, sunflower, or olive oil. Meal planning When eating at a restaurant, ask that your food be prepared with less salt or no salt, if possible. Avoid dishes labeled as brined, pickled, cured, smoked, or made with soy sauce, miso, or teriyaki sauce. Avoid foods that contain MSG (monosodium glutamate). MSG is sometimes added to [place] food, bouillon, and some canned foods. Make meals that can be grilled, baked, poached, roasted, or steamed. These are generally made with less sodium. General information Most people on this plan should limit their sodium intake to 1,500-2,000 mg (milligrams) of sodium each day. What foods should I eat? Fruits Fresh, frozen, or canned fruit. Fruit juice. Vegetables Fresh or frozen vegetables. "No salt added" canned vegetables. "No salt added" tomato sauce and paste. Low-sodium or reduced-sodium tomato and vegetable juice. Grains Low-sodium cereals, including oats, puffed wheat and rice, and shredded wheat. Low-sodium crackers. Unsalted rice. Unsalted pasta. Low-sodium bread. Whole-grain breads and whole-grain pasta. Meats and other proteins Fresh or frozen (no salt added) meat, poultry, seafood, and fish. Low-sodium canned tuna and salmon. Unsalted nuts. Dried peas, beans, and lentils without added salt. Unsalted canned beans. Eggs. Unsalted nut butters. Dairy Milk. Soy milk. Cheese that is naturally low in sodium, such as ricotta cheese, fresh mozzarella, or Swiss cheese.  Low-sodium or reduced-sodium cheese. Cream cheese. Yogurt. Seasonings and condiments Fresh and dried herbs and spices. Salt-free seasonings. Low-sodium mustard and ketchup. Sodium-free salad dressing. Sodium-free light mayonnaise. Fresh or refrigerated horseradish. Lemon juice. Vinegar. Other foods Homemade, reduced-sodium, or low-sodium soups. Unsalted popcorn and pretzels. Low-salt or salt-free chips. The items listed above may not be a complete list of foods and beverages you can eat. Contact a dietitian for more information. What foods should I avoid? Vegetables Sauerkraut, pickled vegetables, and relishes. Olives. [place] fries. Onion rings. Regular canned vegetables (not low-sodium or reduced-sodium). Regular canned tomato sauce and paste (not low-sodium or reduced-sodium). Regular tomato and vegetable juice (not low-sodium or reduced-sodium). Frozen vegetables in sauces. Grains Instant hot cereals. Bread stuffing, pancake, and biscuit mixes. Croutons. Seasoned rice or pasta mixes. Noodle soup cups. Boxed or frozen macaroni and cheese. Regular salted crackers. Self-rising flour. Meats and other proteins Meat or fish that is salted, canned, smoked, spiced, or pickled. Precooked or cured meat, such as sausages or meat loaves. Berniece Salines. Ham. Pepperoni. Hot dogs. Corned beef. Chipped beef. Salt pork. Jerky. Pickled herring. Anchovies and sardines. Regular canned tuna. Salted nuts. Dairy Processed cheese and cheese spreads. Hard cheeses.  Cheese curds. Blue cheese. Feta cheese. String cheese. Regular cottage cheese. Buttermilk. Canned milk. Fats and oils Salted butter. Regular margarine. Ghee. Bacon fat. Seasonings and condiments Onion salt, garlic salt, seasoned salt, table salt, and sea salt. Canned and packaged gravies. Worcestershire sauce. Tartar sauce. Barbecue sauce. Teriyaki sauce. Soy sauce, including reduced-sodium. Steak sauce. Fish sauce. Oyster sauce. Cocktail sauce. Horseradish that you  find on the shelf. Regular ketchup and mustard. Meat flavorings and tenderizers. Bouillon cubes. Hot sauce. Pre-made or packaged marinades. Pre-made or packaged taco seasonings. Relishes. Regular salad dressings. Salsa. Other foods Salted popcorn and pretzels. Corn chips and puffs. Potato and tortilla chips. Canned or dried soups. Pizza. Frozen entrees and pot pies. The items listed above may not be a complete list of foods and beverages you should avoid. Contact a dietitian for more information. Summary Eating less sodium can help lower your blood pressure, reduce swelling, and protect your heart, liver, and kidneys. Most people on this plan should limit their sodium intake to 1,500-2,000 mg (milligrams) of sodium each day. Canned, boxed, and frozen foods are high in sodium. Restaurant foods, fast foods, and pizza are also very high in sodium. You also get sodium by adding salt to food. Try to cook at home, eat more fresh fruits and vegetables, and eat less fast food and canned, processed, or prepared foods. This information is not intended to replace advice given to you by your health care provider. Make sure you discuss any questions you have with your health care provider. Document Revised: 08/11/2019 Document Reviewed: 06/07/2019 Elsevier Patient Education  2023 Elsevier Inc.    Important Information About Sugar

## 2022-05-22 ENCOUNTER — Other Ambulatory Visit: Payer: Self-pay

## 2022-05-22 DIAGNOSIS — S39012A Strain of muscle, fascia and tendon of lower back, initial encounter: Secondary | ICD-10-CM

## 2022-05-22 MED ORDER — MELOXICAM 7.5 MG PO TABS: 7.5000 mg | ORAL_TABLET | Freq: Every day | ORAL | 0 refills | Status: DC

## 2022-05-22 MED ORDER — CYCLOBENZAPRINE HCL 10 MG PO TABS: 10.0000 mg | ORAL_TABLET | Freq: Every evening | ORAL | 0 refills | Status: DC | PRN

## 2022-05-22 NOTE — Telephone Encounter (Signed)
Pt called for refill for meloxicam and cyclobenzaprine as per alyssa send pres

## 2022-05-30 ENCOUNTER — Encounter: Payer: Self-pay | Admitting: Nurse Practitioner

## 2022-07-20 ENCOUNTER — Ambulatory Visit (HOSPITAL_COMMUNITY)
Admission: EM | Admit: 2022-07-20 | Discharge: 2022-07-20 | Disposition: A | Discharge status: Home / Self Care | Payer: PRIVATE HEALTH INSURANCE | Attending: Internal Medicine | Admitting: Internal Medicine

## 2022-07-20 ENCOUNTER — Encounter (HOSPITAL_COMMUNITY): Payer: Self-pay

## 2022-07-20 DIAGNOSIS — Z79899 Other long term (current) drug therapy: Secondary | ICD-10-CM | POA: Diagnosis not present

## 2022-07-20 DIAGNOSIS — R059 Cough, unspecified: Secondary | ICD-10-CM | POA: Insufficient documentation

## 2022-07-20 DIAGNOSIS — Z1152 Encounter for screening for COVID-19: Secondary | ICD-10-CM | POA: Insufficient documentation

## 2022-07-20 DIAGNOSIS — J069 Acute upper respiratory infection, unspecified: Secondary | ICD-10-CM

## 2022-07-20 MED ORDER — TRAMADOL HCL 50 MG PO TABS: 50.0000 mg | ORAL_TABLET | Freq: Two times a day (BID) | ORAL | 0 refills | Status: DC | PRN

## 2022-07-20 MED ORDER — ONDANSETRON 4 MG PO TBDP: 4.0000 mg | ORAL_TABLET | Freq: Three times a day (TID) | ORAL | 0 refills | Status: DC | PRN

## 2022-07-20 MED ORDER — BENZONATATE 200 MG PO CAPS: 200.0000 mg | ORAL_CAPSULE | Freq: Three times a day (TID) | ORAL | 0 refills | Status: DC | PRN

## 2022-07-20 MED ORDER — OSELTAMIVIR PHOSPHATE 75 MG PO CAPS: 75.0000 mg | ORAL_CAPSULE | Freq: Two times a day (BID) | ORAL | 0 refills | Status: DC

## 2022-07-20 NOTE — Discharge Instructions (Addendum)
Increase oral fluid intake Take medications as prescribed Will call you with recommendations if labs are abnormal

## 2022-07-20 NOTE — ED Provider Notes (Signed)
[facility]    CSN: [id number] Arrival date & time: 07/20/22  1728      History   Chief Complaint Chief Complaint  Patient presents with   Cough   Fatigue   Generalized Body Aches    HPI Kathleen [name] is a 57 y.o. female comes to the urgent care with a 2-day history of sudden onset generalized body aches, fever, chills and sore throat.  Patient's symptoms started fairly abruptly yesterday and has been persistent.  She complains of generalized fatigue, nausea with no vomiting.  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  She has a cough which is associated with chest pain.  Cough is not productive of sputum.  She denies any shortness of breath or wheezing.  No dizziness, near syncope or syncopal episode.  Patient is fully vaccinated against COVID-19 virus.  Home COVID test was negative.  HPI  Past Medical History:  Diagnosis Date   Hypertension    Rheumatoid arthritis ([place])     Patient Active Problem List   Diagnosis Date Noted   Pain in limb 04/14/2022   Rheumatoid arthritis ([place]) 12/01/2021   OSA (obstructive sleep apnea) 06/06/2021   Obesity, morbid ([place]) 06/06/2021   Obesity (BMI 30-39.9) 05/05/2021   Atypical pneumonia 04/16/2021   OA (osteoarthritis) of knee 05/02/2014   Essential hypertension, benign 04/13/2014   Right knee pain 04/13/2014   Tobacco use disorder 04/13/2014   Left pyosalpinx 02/04/2014    Past Surgical History:  Procedure Laterality Date   CESAREAN SECTION     3 cesarean sections    OB History     Gravida  6   Para  3   Term  3   Preterm  0   AB  3   Living  3      SAB  3   IAB  0   Ectopic  0   Multiple  0   Live Births               Home Medications    Prior to Admission medications   Medication Sig Start Date End Date Taking? Authorizing Provider  benzonatate (TESSALON) 200 MG capsule Take 1 capsule (200 mg total) by mouth 3 (three) times daily as needed  for cough. 07/20/22  Yes Dondi Aime, Myrene Galas, MD  ondansetron (ZOFRAN-ODT) 4 MG disintegrating tablet Take 1 tablet (4 mg total) by mouth every 8 (eight) hours as needed for nausea or vomiting. 07/20/22  Yes Orel Cooler, Myrene Galas, MD  oseltamivir (TAMIFLU) 75 MG capsule Take 1 capsule (75 mg total) by mouth every 12 (twelve) hours. 07/20/22  Yes Shunte Senseney, Myrene Galas, MD  aspirin EC 81 MG tablet Take 1 tablet (81 mg total) by mouth daily. Swallow whole. 03/28/21   Sherwood Gambler, MD  carvedilol (COREG) 25 MG tablet Take 25 mg by mouth 2 (two) times daily. 09/21/21   [provider]  cholecalciferol (VITAMIN D3) 25 MCG (1000 UNIT) tablet Take 1,000 Units by mouth daily.    [provider]  Collagen-Vitamin C 1000-10 MG TABS Take 1 tablet by mouth daily.    [provider]  Cyanocobalamin (VITAMIN B12 PO) Take 1 tablet by mouth daily.    [provider]  cyclobenzaprine (FLEXERIL) 10 MG tablet Take 1 tablet (10 mg total) by mouth at bedtime as needed for muscle spasms (and musculoskeletal pain). Take one tab po qhs for back spasm prn only 05/22/22  Jonetta Osgood, NP  meloxicam (MOBIC) 7.5 MG tablet Take 1-2 tablets (7.5-15 mg total) by mouth daily. 05/22/22   Jonetta Osgood, NP  Menthol, Topical Analgesic, (BIOFREEZE ROLL-ON EX) Apply topically as needed.    [provider]  Misc Natural Products (OSTEO BI-FLEX/5-LOXIN ADVANCED PO) Take 2 tablets by mouth daily.    [provider]  Multiple Vitamins-Minerals (ONE-A-DAY ENERGY PO) Take 1 tablet by mouth daily.    [provider]  Olmesartan-amLODIPine-HCTZ 40-5-25 MG TABS TAKE 1 TABLET BY MOUTH ONCE DAILY WITH SUPPER 05/12/22   Jonetta Osgood, NP  Omega-3 Fatty Acids (FISH OIL) 1000 MG CAPS Take 1 capsule by mouth at bedtime.    [provider]  rosuvastatin (CRESTOR) 5 MG tablet Take 1 tablet (5 mg total) by mouth every other day. 03/02/22   Jonetta Osgood, NP    Family History Family  History  Problem Relation Age of Onset   Cancer Mother        Female   Hypertension Mother     Social History Social History   Tobacco Use   Smoking status: Every Day    Packs/day: 0.50    Types: Cigarettes   Smokeless tobacco: Never  Vaping Use   Vaping Use: Former  Substance Use Topics   Alcohol use: No   Drug use: No     Allergies   Bee venom and Penicillins   Review of Systems Review of Systems As per HPI  Physical Exam Triage Vital Signs ED Triage Vitals [07/20/22 1844]  Enc Vitals Group     BP 128/78     Pulse Rate 82     Resp 18     Temp 98.8 F (37.1 C)     Temp Source Oral     SpO2 98 %     Weight      Height      Head Circumference      Peak Flow      Pain Score      Pain Loc      Pain Edu?      Excl. in [place]?    No data found.  Updated Vital Signs BP 128/78 (BP Location: Left Arm)   Pulse 82   Temp 98.8 F (37.1 C) (Oral)   Resp 18   LMP 01/02/2016   SpO2 98%   Visual Acuity Right Eye Distance:   Left Eye Distance:   Bilateral Distance:    Right Eye Near:   Left Eye Near:    Bilateral Near:     Physical Exam Vitals and nursing note reviewed.  Constitutional:      General: She is not in acute distress.    Appearance: She is ill-appearing.  HENT:     Mouth/Throat:     Mouth: Mucous membranes are moist.     Pharynx: Posterior oropharyngeal erythema present.  Cardiovascular:     Rate and Rhythm: Normal rate and regular rhythm.     Pulses: Normal pulses.     Heart sounds: Normal heart sounds.  Pulmonary:     Effort: Pulmonary effort is normal.     Breath sounds: Normal breath sounds. No wheezing or rhonchi.  Abdominal:     General: Bowel sounds are normal.     Palpations: Abdomen is soft.  Musculoskeletal:        General: Normal range of motion.  Neurological:     Mental Status: She is alert.      UC Treatments / Results  Labs (  all labs ordered are listed, but only abnormal results are displayed) Labs Reviewed   SARS CORONAVIRUS 2 (TAT 6-24 HRS)    EKG   Radiology No results found.  Procedures Procedures (including critical care time)  Medications Ordered in UC Medications - No data to display  Initial Impression / Assessment and Plan / UC Course  I have reviewed the triage vital signs and the nursing notes.  Pertinent labs & imaging results that were available during my care of the patient were reviewed by me and considered in my medical decision making (see chart for details).     1.  Viral URI with cough: I will treat patient empirically for influenza.  Tamiflu has been prescribed Increase oral fluid intake COVID-19 PCR test has been sent Tessalon Perles as needed Zofran ODT as needed for nausea/vomiting We will call patient with recommendations if labs are abnormal Return precautions given. Final Clinical Impressions(s) / UC Diagnoses   Final diagnoses:  Viral URI with cough     Discharge Instructions      Increase oral fluid intake Take medications as prescribed Will call you with recommendations if labs are abnormal       ED Prescriptions     Medication Sig Dispense Auth. Provider   oseltamivir (TAMIFLU) 75 MG capsule Take 1 capsule (75 mg total) by mouth every 12 (twelve) hours. 10 capsule Twinkle Sockwell, Britta Mccreedy, MD   benzonatate (TESSALON) 200 MG capsule Take 1 capsule (200 mg total) by mouth 3 (three) times daily as needed for cough. 21 capsule Emmanuelle Coxe, Britta Mccreedy, MD   ondansetron (ZOFRAN-ODT) 4 MG disintegrating tablet Take 1 tablet (4 mg total) by mouth every 8 (eight) hours as needed for nausea or vomiting. 20 tablet Lacrisha Bielicki, Britta Mccreedy, MD      I have reviewed the PDMP during this encounter.   Merrilee Jansky, MD 07/20/22 [phone]

## 2022-07-20 NOTE — ED Triage Notes (Signed)
Pt reports fatigue and body aches x 2 days.  Pt reports she has a cough and is taking Mucinex with no relief.  Pt stated she took a home covid test with negative test results.

## 2022-07-21 LAB — SARS CORONAVIRUS 2 (TAT 6-24 HRS): SARS Coronavirus 2: NEGATIVE

## 2022-08-08 ENCOUNTER — Other Ambulatory Visit: Payer: Self-pay | Admitting: Nurse Practitioner

## 2022-08-08 DIAGNOSIS — S39012A Strain of muscle, fascia and tendon of lower back, initial encounter: Secondary | ICD-10-CM

## 2022-08-08 DIAGNOSIS — I10 Essential (primary) hypertension: Secondary | ICD-10-CM

## 2022-08-10 NOTE — Telephone Encounter (Signed)
Please review med

## 2022-08-21 ENCOUNTER — Encounter: Payer: PRIVATE HEALTH INSURANCE | Admitting: Nurse Practitioner

## 2022-09-02 ENCOUNTER — Encounter: Payer: Self-pay | Admitting: Nurse Practitioner

## 2022-09-02 ENCOUNTER — Ambulatory Visit: Payer: PRIVATE HEALTH INSURANCE | Admitting: Nurse Practitioner

## 2022-09-02 VITALS — BP 102/59 | HR 82 | Temp 98.1°F | Resp 16 | Ht 64.0 in | Wt 214.4 lb

## 2022-09-02 DIAGNOSIS — E782 Mixed hyperlipidemia: Secondary | ICD-10-CM | POA: Diagnosis not present

## 2022-09-02 DIAGNOSIS — I10 Essential (primary) hypertension: Secondary | ICD-10-CM

## 2022-09-02 DIAGNOSIS — Z0001 Encounter for general adult medical examination with abnormal findings: Secondary | ICD-10-CM | POA: Diagnosis not present

## 2022-09-02 DIAGNOSIS — E559 Vitamin D deficiency, unspecified: Secondary | ICD-10-CM

## 2022-09-02 MED ORDER — TIZANIDINE HCL 4 MG PO TABS: 4.0000 mg | ORAL_TABLET | Freq: Every evening | ORAL | 2 refills | Status: DC | PRN

## 2022-09-02 MED ORDER — OLMESARTAN-AMLODIPINE-HCTZ 40-5-25 MG PO TABS: 1.0000 | ORAL_TABLET | Freq: Every day | ORAL | 5 refills | Status: DC

## 2022-09-02 MED ORDER — CLOTRIMAZOLE-BETAMETHASONE 1-0.05 % EX CREA: 1.0000 | TOPICAL_CREAM | Freq: Two times a day (BID) | CUTANEOUS | 2 refills | Status: DC

## 2022-09-02 MED ORDER — ROSUVASTATIN CALCIUM 5 MG PO TABS: 5.0000 mg | ORAL_TABLET | ORAL | 2 refills | Status: DC

## 2022-09-02 MED ORDER — CARVEDILOL 25 MG PO TABS: 25.0000 mg | ORAL_TABLET | Freq: Two times a day (BID) | ORAL | 5 refills | Status: DC

## 2022-09-02 NOTE — Progress Notes (Signed)
[facility] [place], [place] [address]  Internal MEDICINE  Office Visit Note  Patient Name: Kathleen [name]  [id number]  [id number]  Date of Service: 09/02/2022  Chief Complaint  Patient presents with   Hypertension   Annual Exam    HPI Kathleen [name] presents for an annual well visit and physical exam.  Well-appearing 57 y.o. female with hypertension, arthritis, heart failure, and high cholesterol.  Routine CRC screening: has cologuard at home.  Routine mammogram: done in march 2023 at [facility]  Pap smear: done last year.  Labs: due for routine labs  New or worsening pain: right leg Other concerns: none    Current Medication: Outpatient Encounter Medications as of 09/02/2022  Medication Sig   aspirin EC 81 MG tablet Take 1 tablet (81 mg total) by mouth daily. Swallow whole.   benzonatate (TESSALON) 200 MG capsule Take 1 capsule (200 mg total) by mouth 3 (three) times daily as needed for cough.   cholecalciferol (VITAMIN D3) 25 MCG (1000 UNIT) tablet Take 1,000 Units by mouth daily.   clotrimazole-betamethasone (LOTRISONE) cream Apply 1 Application topically 2 (two) times daily.   Collagen-Vitamin C 1000-10 MG TABS Take 1 tablet by mouth daily.   Cyanocobalamin (VITAMIN B12 PO) Take 1 tablet by mouth daily.   meloxicam (MOBIC) 7.5 MG tablet TAKE 1 TO 2 TABLETS BY MOUTH ONCE DAILY   Menthol, Topical Analgesic, (BIOFREEZE ROLL-ON EX) Apply topically as needed.   Misc Natural Products (OSTEO BI-FLEX/5-LOXIN ADVANCED PO) Take 2 tablets by mouth daily.   Multiple Vitamins-Minerals (ONE-A-DAY ENERGY PO) Take 1 tablet by mouth daily.   Omega-3 Fatty Acids (FISH OIL) 1000 MG CAPS Take 1 capsule by mouth at bedtime.   ondansetron (ZOFRAN-ODT) 4 MG disintegrating tablet Take 1 tablet (4 mg total) by mouth every 8 (eight) hours as needed for nausea or vomiting.   tiZANidine (ZANAFLEX) 4 MG tablet Take 1 tablet (4 mg total) by mouth at bedtime as needed for muscle  spasms.   traMADol (ULTRAM) 50 MG tablet Take 1 tablet (50 mg total) by mouth every 12 (twelve) hours as needed.   [DISCONTINUED] carvedilol (COREG) 25 MG tablet Take 25 mg by mouth 2 (two) times daily.   [DISCONTINUED] cyclobenzaprine (FLEXERIL) 10 MG tablet Take 1 tablet (10 mg total) by mouth at bedtime as needed for muscle spasms (and musculoskeletal pain). Take one tab po qhs for back spasm prn only   [DISCONTINUED] Olmesartan-amLODIPine-HCTZ 40-5-25 MG TABS TAKE 1 TABLET BY MOUTH ONCE DAILY WITH SUPPER   [DISCONTINUED] oseltamivir (TAMIFLU) 75 MG capsule Take 1 capsule (75 mg total) by mouth every 12 (twelve) hours.   [DISCONTINUED] rosuvastatin (CRESTOR) 5 MG tablet Take 1 tablet (5 mg total) by mouth every other day.   carvedilol (COREG) 25 MG tablet Take 1 tablet (25 mg total) by mouth 2 (two) times daily.   Olmesartan-amLODIPine-HCTZ 40-5-25 MG TABS Take 1 tablet by mouth daily with supper.   rosuvastatin (CRESTOR) 5 MG tablet Take 1 tablet (5 mg total) by mouth every other day.   No facility-administered encounter medications on file as of 09/02/2022.    Surgical History: Past Surgical History:  Procedure Laterality Date   CESAREAN SECTION     3 cesarean sections    Medical History: Past Medical History:  Diagnosis Date   Hypertension    Rheumatoid arthritis ([place])     Family History: Family History  Problem Relation Age of Onset   Cancer Mother  Female   Hypertension Mother     Social History   Socioeconomic History   Marital status: Married    Spouse name: Not on file   Number of children: Not on file   Years of education: Not on file   Highest education level: Not on file  Occupational History   Not on file  Tobacco Use   Smoking status: Every Day    Packs/day: 0.50    Types: Cigarettes   Smokeless tobacco: Never  Vaping Use   Vaping Use: Former  Substance and Sexual Activity   Alcohol use: No   Drug use: No   Sexual activity: Yes    Birth  control/protection: None  Other Topics Concern   Not on file  Social History Narrative   Not on file   Social Determinants of Health   Financial Resource Strain: Not on file  Food Insecurity: Not on file  Transportation Needs: Not on file  Physical Activity: Not on file  Stress: Not on file  Social Connections: Not on file  Intimate Partner Violence: Not on file      Review of Systems  Constitutional:  Negative for activity change, appetite change, chills, fatigue, fever and unexpected weight change.  HENT: Negative.  Negative for congestion, ear pain, rhinorrhea, sore throat and trouble swallowing.   Eyes: Negative.   Respiratory: Negative.  Negative for cough, chest tightness, shortness of breath and wheezing.   Cardiovascular: Negative.  Negative for chest pain.  Gastrointestinal: Negative.  Negative for abdominal pain, blood in stool, constipation, diarrhea, nausea and vomiting.  Endocrine: Negative.   Genitourinary: Negative.  Negative for difficulty urinating, dysuria, frequency, hematuria and urgency.  Musculoskeletal: Negative.  Negative for arthralgias, back pain, joint swelling, myalgias and neck pain.  Skin: Negative.  Negative for rash and wound.  Allergic/Immunologic: Negative.  Negative for immunocompromised state.  Neurological: Negative.  Negative for dizziness, seizures, numbness and headaches.  Hematological: Negative.   Psychiatric/Behavioral: Negative.  Negative for behavioral problems, self-injury and suicidal ideas. The patient is not nervous/anxious.     Vital Signs: BP (!) 102/59   Pulse 82   Temp 98.1 F (36.7 C)   Resp 16   Ht 5' 4"$  (1.626 m)   Wt 214 lb 6.4 oz (97.3 kg)   LMP 01/02/2016   BMI 36.80 kg/m    Physical Exam Vitals reviewed.  Constitutional:      General: She is not in acute distress.    Appearance: Normal appearance. She is well-developed. She is obese. She is not ill-appearing or diaphoretic.  HENT:     Head:  Normocephalic and atraumatic.     Right Ear: Tympanic membrane, ear canal and external ear normal.     Left Ear: Tympanic membrane, ear canal and external ear normal.     Nose: Nose normal. No congestion or rhinorrhea.     Mouth/Throat:     Mouth: Mucous membranes are moist.     Pharynx: Oropharynx is clear. No oropharyngeal exudate or posterior oropharyngeal erythema.  Eyes:     General: No scleral icterus.       Right eye: No discharge.        Left eye: No discharge.     Extraocular Movements: Extraocular movements intact.     Conjunctiva/sclera: Conjunctivae normal.     Pupils: Pupils are equal, round, and reactive to light.  Neck:     Thyroid: No thyromegaly.     Vascular: No JVD.     Trachea: No  tracheal deviation.  Cardiovascular:     Rate and Rhythm: Normal rate and regular rhythm.     Pulses: Normal pulses.     Heart sounds: Normal heart sounds. No murmur heard.    No friction rub. No gallop.  Pulmonary:     Effort: Pulmonary effort is normal. No respiratory distress.     Breath sounds: Normal breath sounds. No stridor. No wheezing or rales.  Chest:     Chest wall: No tenderness.  Abdominal:     General: Bowel sounds are normal. There is no distension.     Palpations: Abdomen is soft. There is no mass.     Tenderness: There is no abdominal tenderness. There is no guarding or rebound.  Musculoskeletal:        General: No tenderness or deformity. Normal range of motion.     Cervical back: Normal range of motion and neck supple.  Lymphadenopathy:     Cervical: No cervical adenopathy.  Skin:    General: Skin is warm and dry.     Capillary Refill: Capillary refill takes less than 2 seconds.     Coloration: Skin is not pale.     Findings: No erythema or rash.  Neurological:     Mental Status: She is alert and oriented to person, place, and time.     Cranial Nerves: No cranial nerve deficit.     Motor: No abnormal muscle tone.     Coordination: Coordination normal.      Gait: Gait normal.     Deep Tendon Reflexes: Reflexes are normal and symmetric.  Psychiatric:        Mood and Affect: Mood normal.        Behavior: Behavior normal.        Thought Content: Thought content normal.        Judgment: Judgment normal.        Assessment/Plan: 1. Encounter for routine adult health examination with abnormal findings Age-appropriate preventive screenings and vaccinations discussed, annual physical exam completed. Routine labs for health maintenance ordered, see below. PHM updated.  - CBC with Differential/Platelet - CMP14+EGFR - Lipid Profile - Vitamin D (25 hydroxy)  2. Essential hypertension, benign Continue medications as prescribed. Routine labs ordered - Olmesartan-amLODIPine-HCTZ 40-5-25 MG TABS; Take 1 tablet by mouth daily with supper.  Dispense: 30 tablet; Refill: 5 - CBC with Differential/Platelet - CMP14+EGFR - Lipid Profile - Vitamin D (25 hydroxy)  3. Mixed hyperlipidemia Continue rosuvastatin as prescribed. Routine labs ordered - CBC with Differential/Platelet - CMP14+EGFR - Lipid Profile - Vitamin D (25 hydroxy) - rosuvastatin (CRESTOR) 5 MG tablet; Take 1 tablet (5 mg total) by mouth every other day.  Dispense: 45 tablet; Refill: 2  4. Vitamin D deficiency Routine labs ordered - CBC with Differential/Platelet - CMP14+EGFR - Lipid Profile - Vitamin D (25 hydroxy)      General Counseling: Emmogene verbalizes understanding of the findings of todays visit and agrees with plan of treatment. I have discussed any further diagnostic evaluation that may be needed or ordered today. We also reviewed her medications today. she has been encouraged to call the office with any questions or concerns that should arise related to todays visit.    Orders Placed This Encounter  Procedures   CBC with Differential/Platelet   CMP14+EGFR   Lipid Profile   Vitamin D (25 hydroxy)    Meds ordered this encounter  Medications    Olmesartan-amLODIPine-HCTZ 40-5-25 MG TABS    Sig: Take 1 tablet by mouth  daily with supper.    Dispense:  30 tablet    Refill:  5    For future refills   tiZANidine (ZANAFLEX) 4 MG tablet    Sig: Take 1 tablet (4 mg total) by mouth at bedtime as needed for muscle spasms.    Dispense:  30 tablet    Refill:  2   clotrimazole-betamethasone (LOTRISONE) cream    Sig: Apply 1 Application topically 2 (two) times daily.    Dispense:  45 g    Refill:  2   rosuvastatin (CRESTOR) 5 MG tablet    Sig: Take 1 tablet (5 mg total) by mouth every other day.    Dispense:  45 tablet    Refill:  2    For future refills   carvedilol (COREG) 25 MG tablet    Sig: Take 1 tablet (25 mg total) by mouth 2 (two) times daily.    Dispense:  60 tablet    Refill:  5    For future refills    Return in about 6 months (around 03/03/2023) for F/U, Jabril Pursell PCP.   Total time spent:30 Minutes Time spent includes review of chart, medications, test results, and follow up plan with the patient.   [place] Controlled Substance Database was reviewed by me.  This patient was seen by Jonetta Osgood, FNP-C in collaboration with Dr. Clayborn Bigness as a part of collaborative care agreement.  Christabel Camire R. Valetta Fuller, MSN, FNP-C Internal medicine

## 2022-09-16 LAB — CBC WITH DIFFERENTIAL/PLATELET
Basophils Absolute: 0.1 10*3/uL (ref 0.0–0.2)
Basos: 1 %
EOS (ABSOLUTE): 0.3 10*3/uL (ref 0.0–0.4)
Eos: 4 %
Hematocrit: 41.5 % (ref 34.0–46.6)
Hemoglobin: 13.9 g/dL (ref 11.1–15.9)
Immature Grans (Abs): 0 10*3/uL (ref 0.0–0.1)
Immature Granulocytes: 0 %
Lymphocytes Absolute: 2.8 10*3/uL (ref 0.7–3.1)
Lymphs: 47 %
MCH: 30.6 pg (ref 26.6–33.0)
MCHC: 33.5 g/dL (ref 31.5–35.7)
MCV: 91 fL (ref 79–97)
Monocytes Absolute: 0.5 10*3/uL (ref 0.1–0.9)
Monocytes: 9 %
Neutrophils Absolute: 2.3 10*3/uL (ref 1.4–7.0)
Neutrophils: 39 %
Platelets: 310 10*3/uL (ref 150–450)
RBC: 4.54 x10E6/uL (ref 3.77–5.28)
RDW: 12.5 % (ref 11.7–15.4)
WBC: 6 10*3/uL (ref 3.4–10.8)

## 2022-09-16 LAB — CMP14+EGFR
ALT: 19 IU/L (ref 0–32)
AST: 18 IU/L (ref 0–40)
Albumin/Globulin Ratio: 1.5 (ref 1.2–2.2)
Albumin: 4.4 g/dL (ref 3.8–4.9)
Alkaline Phosphatase: 86 IU/L (ref 44–121)
BUN/Creatinine Ratio: 19 (ref 9–23)
BUN: 18 mg/dL (ref 6–24)
Bilirubin Total: 0.3 mg/dL (ref 0.0–1.2)
CO2: 26 mmol/L (ref 20–29)
Calcium: 10.9 mg/dL — ABNORMAL HIGH (ref 8.7–10.2)
Chloride: 101 mmol/L (ref 96–106)
Creatinine, Ser: 0.96 mg/dL (ref 0.57–1.00)
Globulin, Total: 2.9 g/dL (ref 1.5–4.5)
Glucose: 111 mg/dL — ABNORMAL HIGH (ref 70–99)
Potassium: 4.3 mmol/L (ref 3.5–5.2)
Sodium: 141 mmol/L (ref 134–144)
Total Protein: 7.3 g/dL (ref 6.0–8.5)
eGFR: 69 mL/min/{1.73_m2} (ref >59)

## 2022-09-16 LAB — VITAMIN D 25 HYDROXY (VIT D DEFICIENCY, FRACTURES): Vit D, 25-Hydroxy: 42.6 ng/mL (ref 30.0–100.0)

## 2022-09-16 LAB — LIPID PANEL
Chol/HDL Ratio: 3.1 ratio (ref 0.0–4.4)
Cholesterol, Total: 179 mg/dL (ref 100–199)
HDL: 58 mg/dL (ref >39)
LDL Chol Calc (NIH): 102 mg/dL — ABNORMAL HIGH (ref 0–99)
Triglycerides: 105 mg/dL (ref 0–149)
VLDL Cholesterol Cal: 19 mg/dL (ref 5–40)

## 2022-10-12 NOTE — Progress Notes (Signed)
Calcium is elevated on her lab results -- will repeat her labs at her next office visit and add parathyroid workup Cholesterol levels are still improving CBC and vitamin D levels are normal

## 2022-11-19 ENCOUNTER — Encounter: Payer: Self-pay | Admitting: Cardiovascular Disease

## 2022-11-19 ENCOUNTER — Ambulatory Visit: Payer: PRIVATE HEALTH INSURANCE | Attending: Cardiovascular Disease | Admitting: Cardiovascular Disease

## 2022-11-19 VITALS — BP 90/58 | HR 86 | Ht 64.0 in | Wt 216.5 lb

## 2022-11-19 DIAGNOSIS — I10 Essential (primary) hypertension: Secondary | ICD-10-CM

## 2022-11-19 DIAGNOSIS — Z720 Tobacco use: Secondary | ICD-10-CM | POA: Diagnosis not present

## 2022-11-19 DIAGNOSIS — E785 Hyperlipidemia, unspecified: Secondary | ICD-10-CM

## 2022-11-19 DIAGNOSIS — I119 Hypertensive heart disease without heart failure: Secondary | ICD-10-CM

## 2022-11-19 MED ORDER — OLMESARTAN-AMLODIPINE-HCTZ 40-5-12.5 MG PO TABS: 1.0000 | ORAL_TABLET | Freq: Every day | ORAL | 1 refills | Status: DC

## 2022-11-19 NOTE — Progress Notes (Signed)
Cardiology Office Note   Date:  11/19/2022   ID:  Kathleen [name], DOB 11/19/1965, MRN [id number]  PCP:  Kathleen Kuster, NP  Cardiologist:   Kathleen Bears, MD   Chief Complaint  Patient presents with   Follow-up    6 month f/u c/o no complaints today. Meds reviewed verbally with pt.      History of Present Illness: Kathleen [name] is a 57 y.o. female who is here today for follow-up visit regarding hypertensive heart disease.   She has known history of essential hypertension, hyperlipidemia, chronic back pain, sleep apnea not on CPAP, tobacco use and obesity. She has difficult to control hypertension and her blood pressure was extremely high in early 2023.  She reports that her systolic blood pressure was greater than 200 and she was having frequent episodes of chest pain and shortness of breath.  She had an echocardiogram done in January of 2023 which showed normal LV systolic function with mild left ventricular hypertrophy and impaired relaxation.   Her blood pressure became controlled with antihypertensive medications and she reports significant improvement in symptoms.  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  Her job is very stressful.  She works from 7 PM to 7 AM which makes it hard for her to get good quality sleep.  She does not use CPAP for sleep apnea.   Past Medical History:  Diagnosis Date   Hypertension    Rheumatoid arthritis (HCC)     Past Surgical History:  Procedure Laterality Date   CESAREAN SECTION     3 cesarean sections     Current Outpatient Medications  Medication Sig Dispense Refill   aspirin EC 81 MG tablet Take 1 tablet (81 mg total) by mouth daily. Swallow whole. 30 tablet 11   carvedilol (COREG) 25 MG tablet Take 1 tablet (25 mg total) by mouth 2 (two) times daily. 60 tablet 5   cholecalciferol (VITAMIN D3) 25 MCG (1000 UNIT) tablet Take 1,000 Units by mouth daily.     clotrimazole-betamethasone (LOTRISONE) cream Apply 1  Application topically 2 (two) times daily. 45 g 2   Collagen-Vitamin C 1000-10 MG TABS Take 1 tablet by mouth daily.     Cyanocobalamin (VITAMIN B12 PO) Take 1 tablet by mouth daily.     meloxicam (MOBIC) 7.5 MG tablet TAKE 1 TO 2 TABLETS BY MOUTH ONCE DAILY 60 tablet 0   Menthol, Topical Analgesic, (BIOFREEZE ROLL-ON EX) Apply topically as needed.     Misc Natural Products (OSTEO BI-FLEX/5-LOXIN ADVANCED PO) Take 2 tablets by mouth daily.     Multiple Vitamins-Minerals (ONE-A-DAY ENERGY PO) Take 1 tablet by mouth daily.     Olmesartan-amLODIPine-HCTZ 40-5-25 MG TABS Take 1 tablet by mouth daily with supper. 30 tablet 5   Omega-3 Fatty Acids (FISH OIL) 1000 MG CAPS Take 1 capsule by mouth at bedtime.     rosuvastatin (CRESTOR) 5 MG tablet Take 1 tablet (5 mg total) by mouth every other day. 45 tablet 2   tiZANidine (ZANAFLEX) 4 MG tablet Take 1 tablet (4 mg total) by mouth at bedtime as needed for muscle spasms. 30 tablet 2   traMADol (ULTRAM) 50 MG tablet Take 1 tablet (50 mg total) by mouth every 12 (twelve) hours as needed. 12 tablet 0   benzonatate (TESSALON) 200 MG capsule Take 1 capsule (200 mg total) by mouth 3 (three) times daily as needed for cough. (Patient not taking: Reported on 11/19/2022) 21 capsule 0  ondansetron (ZOFRAN-ODT) 4 MG disintegrating tablet Take 1 tablet (4 mg total) by mouth every 8 (eight) hours as needed for nausea or vomiting. (Patient not taking: Reported on 11/19/2022) 20 tablet 0   No current facility-administered medications for this visit.    Allergies:   Bee venom and Penicillins    Social History:  The patient  reports that she has been smoking cigarettes. She has been smoking an average of .5 packs per day. She has never used smokeless tobacco. She reports that she does not drink alcohol and does not use drugs.   Family History:  The patient's family history includes Cancer in her mother; Hypertension in her mother.    ROS:  Please see the history of  present illness.   Otherwise, review of systems are positive for none.   All other systems are reviewed and negative.    PHYSICAL EXAM: VS:  BP (!) 90/58 (BP Location: Left Arm, Patient Position: Sitting, Cuff Size: Large)   Pulse 86   Ht 5\' 4"  (1.626 m)   Wt 216 lb 8 oz (98.2 kg)   LMP 01/02/2016   SpO2 98%   BMI 37.16 kg/m  , BMI Body mass index is 37.16 kg/m. GEN: Well nourished, well developed, in no acute distress  HEENT: normal  Neck: no JVD, carotid bruits, or masses Cardiac: RRR; no murmurs, rubs, or gallops,no edema  Respiratory:  clear to auscultation bilaterally, normal work of breathing GI: soft, nontender, nondistended, + BS MS: no deformity or atrophy  Skin: warm and dry, no rash Neuro:  Strength and sensation are intact Psych: euthymic mood, full affect   EKG:  EKG is ordered today. The ekg ordered today demonstrates normal sinus rhythm with no significant ST or T wave changes.   Recent Labs: 09/15/2022: ALT 19; BUN 18; Creatinine, Ser 0.96; Hemoglobin 13.9; Platelets 310; Potassium 4.3; Sodium 141    Lipid Panel    Component Value Date/Time   CHOL 179 09/15/2022 1035   TRIG 105 09/15/2022 1035   HDL 58 09/15/2022 1035   CHOLHDL 3.1 09/15/2022 1035   CHOLHDL 4.0 04/13/2014 1259   VLDL 17 04/13/2014 1259   LDLCALC 102 (H) 09/15/2022 1035      Wt Readings from Last 3 Encounters:  11/19/22 216 lb 8 oz (98.2 kg)  09/02/22 214 lb 6.4 oz (97.3 kg)  05/21/22 213 lb (96.6 kg)          05/21/2022    9:56 AM  PAD Screen  Previous PAD dx? No  Previous surgical procedure? Yes  Pain with walking? No  Feet/toe relief with dangling? No  Painful, non-healing ulcers? No  Extremities discolored? No      ASSESSMENT AND PLAN:  1.  Hypertensive heart disease without heart failure: She is doing well overall with no evidence of heart failure.  Her symptoms improved since her blood pressure became controlled.  2.  Atypical chest pain: Her symptoms were  mostly in the setting of uncontrolled hypertension.  She reports improvement in symptoms.  I reviewed the records from Kathleen [name] office and it does not appear that she had ischemic cardiac evaluation.  This can be considered if she has recurrent symptoms.  Currently she is chest pain-free with overall improvement since her blood pressure is controlled.  3.  Essential hypertension: Her blood pressure has been running on the low side.  I elected to decrease hydrochlorothiazide to 12.5 in the combination medication of olmesartan, amlodipine and hydrochlorothiazide.  4.  Hyperlipidemia:  Continue small dose rosuvastatin.  5.  Tobacco use: I discussed with her the importance of smoking cessation.    Disposition:   FU with me in 6 months  Signed,  Kathleen Bears, MD  11/19/2022 10:01 AM    [facility]

## 2022-11-19 NOTE — Patient Instructions (Signed)
Medication Instructions:  DECREASE the Olmesartan-Amlodipine-HCTZ to 40-5-12.5 mg once daily  *If you need a refill on your cardiac medications before your next appointment, please call your pharmacy*   Lab Work: None ordered If you have labs (blood work) drawn today and your tests are completely normal, you will receive your results only by: MyChart Message (if you have MyChart) OR A paper copy in the mail If you have any lab test that is abnormal or we need to change your treatment, we will call you to review the results.   Testing/Procedures: None ordered   Follow-Up: At [facility], you and your health needs are our priority.  As part of our continuing mission to provide you with exceptional heart care, we have created designated Provider Care Teams.  These Care Teams include your primary Cardiologist (physician) and Advanced Practice Providers (APPs -  Physician Assistants and Nurse Practitioners) who all work together to provide you with the care you need, when you need it.  We recommend signing up for the patient portal called "MyChart".  Sign up information is provided on this After Visit Summary.  MyChart is used to connect with patients for Virtual Visits (Telemedicine).  Patients are able to view lab/test results, encounter notes, upcoming appointments, etc.  Non-urgent messages can be sent to your provider as well.   To learn more about what you can do with MyChart, go to [email or web address].    Your next appointment:   6 month(s)  Provider:   You may see Dr. Kirke Corin or one of the following Advanced Practice Providers on your designated Care Team:   Nicolasa Ducking, NP Eula Listen, PA-C Cadence Fransico Michael, PA-C Charlsie Quest, NP

## 2022-11-30 ENCOUNTER — Ambulatory Visit: Payer: PRIVATE HEALTH INSURANCE | Admitting: Internal Medicine

## 2022-12-15 ENCOUNTER — Ambulatory Visit (INDEPENDENT_AMBULATORY_CARE_PROVIDER_SITE_OTHER): Payer: PRIVATE HEALTH INSURANCE | Admitting: Internal Medicine

## 2022-12-15 ENCOUNTER — Encounter: Payer: Self-pay | Admitting: Internal Medicine

## 2022-12-15 VITALS — BP 135/85 | HR 97 | Temp 98.4°F | Resp 16 | Ht 64.0 in | Wt 217.4 lb

## 2022-12-15 DIAGNOSIS — I509 Heart failure, unspecified: Secondary | ICD-10-CM | POA: Diagnosis not present

## 2022-12-15 DIAGNOSIS — G4733 Obstructive sleep apnea (adult) (pediatric): Secondary | ICD-10-CM | POA: Diagnosis not present

## 2022-12-15 NOTE — Progress Notes (Signed)
[facility] [address] [place], [place] [address]  Pulmonary Sleep Medicine   Office Visit Note  Patient Name: Kathleen [name] DOB: 02/15/1966 MRN [id number]  Date of Service: 12/15/2022  Complaints/HPI: She is doing better with her medical issues. She unfortunately is still smoking but down to half pack. She states she is trying to work her way down. Has anxiety issues she states with the smoking. She states she is not still interested with getting a CPAP. Sh ehas not tried the CPAP again concerned about being able to afford the PAP machine  Office Spirometry Results:     ROS  General: (-) fever, (-) chills, (-) night sweats, (-) weakness Skin: (-) rashes, (-) itching,. Eyes: (-) visual changes, (-) redness, (-) itching. Nose and Sinuses: (-) nasal stuffiness or itchiness, (-) postnasal drip, (-) nosebleeds, (-) sinus trouble. Mouth and Throat: (-) sore throat, (-) hoarseness. Neck: (-) swollen glands, (-) enlarged thyroid, (-) neck pain. Respiratory: - cough, (-) bloody sputum, - shortness of breath, - wheezing. Cardiovascular: - ankle swelling, (-) chest pain. Lymphatic: (-) lymph node enlargement. Neurologic: (-) numbness, (-) tingling. Psychiatric: (-) anxiety, (-) depression   Current Medication: Outpatient Encounter Medications as of 12/15/2022  Medication Sig   aspirin EC 81 MG tablet Take 1 tablet (81 mg total) by mouth daily. Swallow whole.   benzonatate (TESSALON) 200 MG capsule Take 1 capsule (200 mg total) by mouth 3 (three) times daily as needed for cough.   carvedilol (COREG) 25 MG tablet Take 1 tablet (25 mg total) by mouth 2 (two) times daily.   cholecalciferol (VITAMIN D3) 25 MCG (1000 UNIT) tablet Take 1,000 Units by mouth daily.   clotrimazole-betamethasone (LOTRISONE) cream Apply 1 Application topically 2 (two) times daily.   Collagen-Vitamin C 1000-10 MG TABS Take 1 tablet by mouth daily.   Cyanocobalamin (VITAMIN B12 PO) Take 1 tablet by  mouth daily.   meloxicam (MOBIC) 7.5 MG tablet TAKE 1 TO 2 TABLETS BY MOUTH ONCE DAILY   Menthol, Topical Analgesic, (BIOFREEZE ROLL-ON EX) Apply topically as needed.   Misc Natural Products (OSTEO BI-FLEX/5-LOXIN ADVANCED PO) Take 2 tablets by mouth daily.   Multiple Vitamins-Minerals (ONE-A-DAY ENERGY PO) Take 1 tablet by mouth daily.   Olmesartan-amLODIPine-HCTZ 40-5-12.5 MG TABS Take 1 tablet by mouth daily.   Omega-3 Fatty Acids (FISH OIL) 1000 MG CAPS Take 1 capsule by mouth at bedtime.   ondansetron (ZOFRAN-ODT) 4 MG disintegrating tablet Take 1 tablet (4 mg total) by mouth every 8 (eight) hours as needed for nausea or vomiting.   rosuvastatin (CRESTOR) 5 MG tablet Take 1 tablet (5 mg total) by mouth every other day.   tiZANidine (ZANAFLEX) 4 MG tablet Take 1 tablet (4 mg total) by mouth at bedtime as needed for muscle spasms.   traMADol (ULTRAM) 50 MG tablet Take 1 tablet (50 mg total) by mouth every 12 (twelve) hours as needed.   No facility-administered encounter medications on file as of 12/15/2022.    Surgical History: Past Surgical History:  Procedure Laterality Date   CESAREAN SECTION     3 cesarean sections    Medical History: Past Medical History:  Diagnosis Date   Hypertension    Rheumatoid arthritis (HCC)     Family History: Family History  Problem Relation Age of Onset   Cancer Mother        Female   Hypertension Mother     Social History: Social History   Socioeconomic History   Marital status:  Married    Spouse name: Not on file   Number of children: Not on file   Years of education: Not on file   Highest education level: Not on file  Occupational History   Not on file  Tobacco Use   Smoking status: Every Day    Packs/day: .5    Types: Cigarettes   Smokeless tobacco: Never  Vaping Use   Vaping Use: Former  Substance and Sexual Activity   Alcohol use: No   Drug use: No   Sexual activity: Yes    Birth control/protection: None  Other Topics  Concern   Not on file  Social History Narrative   Not on file   Social Determinants of Health   Financial Resource Strain: Not on file  Food Insecurity: Not on file  Transportation Needs: Not on file  Physical Activity: Not on file  Stress: Not on file  Social Connections: Not on file  Intimate Partner Violence: Not on file    Vital Signs: Blood pressure 135/85, pulse 97, temperature 98.4 F (36.9 C), resp. rate 16, height 5\' 4"  (1.626 m), weight 217 lb 6.4 oz (98.6 kg), last menstrual period 01/02/2016, SpO2 99 %.  Examination: General Appearance: The patient is well-developed, well-nourished, and in no distress. Skin: Gross inspection of skin unremarkable. Head: normocephalic, no gross deformities. Eyes: no gross deformities noted. ENT: ears appear grossly normal no exudates. Neck: Supple. No thyromegaly. No LAD. Respiratory: no rhonchi noted. Cardiovascular: Normal S1 and S2 without murmur or rub. Extremities: No cyanosis. pulses are equal. Neurologic: Alert and oriented. No involuntary movements.  LABS: No results found for this or any previous visit (from the past 2160 hour(s)).  Radiology: No results found.  No results found.  No results found.  Assessment and Plan: Patient Active Problem List   Diagnosis Date Noted   Pain in limb 04/14/2022   Rheumatoid arthritis (HCC) 12/01/2021   OSA (obstructive sleep apnea) 06/06/2021   Obesity, morbid (HCC) 06/06/2021   Obesity (BMI 30-39.9) 05/05/2021   Atypical pneumonia 04/16/2021   OA (osteoarthritis) of knee 05/02/2014   Essential hypertension, benign 04/13/2014   Right knee pain 04/13/2014   Tobacco use disorder 04/13/2014   Left pyosalpinx 02/04/2014    1. OSA (obstructive sleep apnea) Still does not want to go for CPAP. I discussed potential health risks she understands  2. Obesity, morbid (HCC) Obesity Counseling: Had a lengthy discussion regarding patients BMI and weight issues. Patient was  instructed on portion control as well as increased activity. Also discussed caloric restrictions with trying to maintain intake less than 2000 Kcal. Discussions were made in accordance with the 5As of weight management. Simple actions such as not eating late and if able to, taking a walk is suggested.   3. Chronic congestive heart failure, unspecified heart failure type (HCC) Compensated follow up with her PCP   General Counseling: I have discussed the findings of the evaluation and examination with Rayfield Citizen.  I have also discussed any further diagnostic evaluation thatmay be needed or ordered today. Angia verbalizes understanding of the findings of todays visit. We also reviewed her medications today and discussed drug interactions and side effects including but not limited excessive drowsiness and altered mental states. We also discussed that there is always a risk not just to her but also people around her. she has been encouraged to call the office with any questions or concerns that should arise related to todays visit.  No orders of the defined types were  placed in this encounter.    Time spent: 78  I have personally obtained a history, examined the patient, evaluated laboratory and imaging results, formulated the assessment and plan and placed orders.    Yevonne Pax, MD [facility] Pulmonary and Critical Care Sleep medicine

## 2023-01-05 ENCOUNTER — Other Ambulatory Visit: Payer: Self-pay | Admitting: Nurse Practitioner

## 2023-01-05 DIAGNOSIS — S39012A Strain of muscle, fascia and tendon of lower back, initial encounter: Secondary | ICD-10-CM

## 2023-03-03 ENCOUNTER — Encounter: Payer: Self-pay | Admitting: Nurse Practitioner

## 2023-03-03 ENCOUNTER — Ambulatory Visit (INDEPENDENT_AMBULATORY_CARE_PROVIDER_SITE_OTHER): Payer: PRIVATE HEALTH INSURANCE | Admitting: Nurse Practitioner

## 2023-03-03 VITALS — BP 130/82 | HR 87 | Temp 98.3°F | Resp 16 | Ht 64.0 in | Wt 217.0 lb

## 2023-03-03 DIAGNOSIS — E782 Mixed hyperlipidemia: Secondary | ICD-10-CM

## 2023-03-03 DIAGNOSIS — I509 Heart failure, unspecified: Secondary | ICD-10-CM | POA: Diagnosis not present

## 2023-03-03 DIAGNOSIS — I10 Essential (primary) hypertension: Secondary | ICD-10-CM

## 2023-03-03 DIAGNOSIS — S39012A Strain of muscle, fascia and tendon of lower back, initial encounter: Secondary | ICD-10-CM

## 2023-03-03 DIAGNOSIS — X503XXA Overexertion from repetitive movements, initial encounter: Secondary | ICD-10-CM

## 2023-03-03 MED ORDER — TIZANIDINE HCL 4 MG PO TABS
4.0000 mg | ORAL_TABLET | Freq: Every evening | ORAL | 2 refills | Status: DC | PRN
Start: 2023-03-03 — End: 2023-04-02

## 2023-03-03 MED ORDER — MELOXICAM 7.5 MG PO TABS
7.5000 mg | ORAL_TABLET | Freq: Every day | ORAL | 2 refills | Status: DC
Start: 2023-03-03 — End: 2023-04-02

## 2023-03-03 NOTE — Progress Notes (Signed)
[facility] [address] [place], [place] [address]  Internal MEDICINE  Office Visit Note  Patient Name: Kathleen [name]  [id number]  [id number]  Date of Service: 03/03/2023  Chief Complaint  Patient presents with   Hypertension   Follow-up    HPI Kathleen [name] presents for a follow-up visit for routine care for hypertension, back strain, and high cholesterol.  Hypertension -- cardiology decreased hydrochlorothiazide in the triple therapy pill at her last visit.  Heart failure -- followed by cardiology.  Back strain-- meloxicam is helping. Methocarbamol is not helping. Wants to a different muscle relaxant.  High cholesterol -- takes rosuvastatin 5 mg every other day and a fish oil supplement daily.      Current Medication: Outpatient Encounter Medications as of 03/03/2023  Medication Sig   aspirin EC 81 MG tablet Take 1 tablet (81 mg total) by mouth daily. Swallow whole.   carvedilol (COREG) 25 MG tablet Take 1 tablet (25 mg total) by mouth 2 (two) times daily.   cholecalciferol (VITAMIN D3) 25 MCG (1000 UNIT) tablet Take 1,000 Units by mouth daily.   clotrimazole-betamethasone (LOTRISONE) cream Apply 1 Application topically 2 (two) times daily.   Collagen-Vitamin C 1000-10 MG TABS Take 1 tablet by mouth daily.   Cyanocobalamin (VITAMIN B12 PO) Take 1 tablet by mouth daily.   Menthol, Topical Analgesic, (BIOFREEZE ROLL-ON EX) Apply topically as needed.   Misc Natural Products (OSTEO BI-FLEX/5-LOXIN ADVANCED PO) Take 2 tablets by mouth daily.   Multiple Vitamins-Minerals (ONE-A-DAY ENERGY PO) Take 1 tablet by mouth daily.   Olmesartan-amLODIPine-HCTZ 40-5-12.5 MG TABS Take 1 tablet by mouth daily.   Omega-3 Fatty Acids (FISH OIL) 1000 MG CAPS Take 1 capsule by mouth at bedtime.   ondansetron (ZOFRAN-ODT) 4 MG disintegrating tablet Take 1 tablet (4 mg total) by mouth every 8 (eight) hours as needed for nausea or vomiting.   rosuvastatin (CRESTOR) 5 MG tablet Take 1 tablet  (5 mg total) by mouth every other day.   [DISCONTINUED] benzonatate (TESSALON) 200 MG capsule Take 1 capsule (200 mg total) by mouth 3 (three) times daily as needed for cough.   [DISCONTINUED] meloxicam (MOBIC) 7.5 MG tablet TAKE 1 TO 2 TABLETS BY MOUTH ONCE DAILY   [DISCONTINUED] tiZANidine (ZANAFLEX) 4 MG tablet Take 1 tablet (4 mg total) by mouth at bedtime as needed for muscle spasms.   [DISCONTINUED] traMADol (ULTRAM) 50 MG tablet Take 1 tablet (50 mg total) by mouth every 12 (twelve) hours as needed.   meloxicam (MOBIC) 7.5 MG tablet Take 1 tablet (7.5 mg total) by mouth daily.   tiZANidine (ZANAFLEX) 4 MG tablet Take 1 tablet (4 mg total) by mouth at bedtime as needed for muscle spasms.   No facility-administered encounter medications on file as of 03/03/2023.    Surgical History: Past Surgical History:  Procedure Laterality Date   CESAREAN SECTION     3 cesarean sections    Medical History: Past Medical History:  Diagnosis Date   Hypertension    Rheumatoid arthritis (HCC)     Family History: Family History  Problem Relation Age of Onset   Cancer Mother        Female   Hypertension Mother     Social History   Socioeconomic History   Marital status: Married    Spouse name: Not on file   Number of children: Not on file   Years of education: Not on file   Highest education level: Not on file  Occupational History  Not on file  Tobacco Use   Smoking status: Every Day    Current packs/day: 0.50    Types: Cigarettes   Smokeless tobacco: Never  Vaping Use   Vaping status: Former  Substance and Sexual Activity   Alcohol use: No   Drug use: No   Sexual activity: Yes    Birth control/protection: None  Other Topics Concern   Not on file  Social History Narrative   Not on file   Social Determinants of Health   Financial Resource Strain: Not on file  Food Insecurity: Not on file  Transportation Needs: Not on file  Physical Activity: Not on file  Stress: Not  on file  Social Connections: Not on file  Intimate Partner Violence: Not on file      Review of Systems  Constitutional:  Negative for chills, fatigue and unexpected weight change.  HENT:  Negative for congestion, postnasal drip, rhinorrhea, sneezing and sore throat.   Respiratory: Negative.  Negative for cough, chest tightness, shortness of breath and wheezing.   Cardiovascular: Negative.  Negative for chest pain and palpitations.  Gastrointestinal: Negative.  Negative for abdominal pain, constipation, diarrhea, nausea and vomiting.  Musculoskeletal:  Positive for arthralgias, back pain and myalgias. Negative for joint swelling and neck pain.  Skin:  Negative for rash.  Neurological: Negative.  Negative for tremors, numbness and headaches.  Psychiatric/Behavioral:  Negative for behavioral problems (Depression), sleep disturbance and suicidal ideas. The patient is not nervous/anxious.     Vital Signs: BP 130/82 Comment: 140/86  Pulse 87   Temp 98.3 F (36.8 C)   Resp 16   Ht 5\' 4"  (1.626 m)   Wt 217 lb (98.4 kg)   LMP 01/02/2016   SpO2 95%   BMI 37.25 kg/m    Physical Exam Vitals reviewed.  Constitutional:      General: She is not in acute distress.    Appearance: Normal appearance. She is obese. She is not ill-appearing.  HENT:     Head: Normocephalic and atraumatic.  Eyes:     Pupils: Pupils are equal, round, and reactive to light.  Cardiovascular:     Rate and Rhythm: Normal rate and regular rhythm.  Pulmonary:     Effort: Pulmonary effort is normal. No respiratory distress.  Neurological:     Mental Status: She is alert and oriented to person, place, and time.  Psychiatric:        Mood and Affect: Mood normal.        Behavior: Behavior normal.        Assessment/Plan: 1. Essential hypertension, benign Stable, continue medications as prescribed.   2. Mixed hyperlipidemia Continue rosuvastatin and fish oil supplement as prescribed.   3. Chronic  congestive heart failure, unspecified heart failure type [facility]) Managed by cardiology  4. Repetitive strain injury of lower back, initial encounter Continue meloxicam as prescribed. Discontinue methocarbamol. Start tizanidine as prescribed.  - tiZANidine (ZANAFLEX) 4 MG tablet; Take 1 tablet (4 mg total) by mouth at bedtime as needed for muscle spasms.  Dispense: 30 tablet; Refill: 2 - meloxicam (MOBIC) 7.5 MG tablet; Take 1 tablet (7.5 mg total) by mouth daily.  Dispense: 60 tablet; Refill: 2   General Counseling: Sabreen verbalizes understanding of the findings of todays visit and agrees with plan of treatment. I have discussed any further diagnostic evaluation that may be needed or ordered today. We also reviewed her medications today. she has been encouraged to call the office with any questions or concerns  that should arise related to todays visit.    No orders of the defined types were placed in this encounter.   Meds ordered this encounter  Medications   tiZANidine (ZANAFLEX) 4 MG tablet    Sig: Take 1 tablet (4 mg total) by mouth at bedtime as needed for muscle spasms.    Dispense:  30 tablet    Refill:  2   meloxicam (MOBIC) 7.5 MG tablet    Sig: Take 1 tablet (7.5 mg total) by mouth daily.    Dispense:  60 tablet    Refill:  2    Return for previously scheduled, CPE, Krystal Teachey PCP in february 2025, and otherwise as needed. .   Total time spent:30 Minutes Time spent includes review of chart, medications, test results, and follow up plan with the patient.   [place] Controlled Substance Database was reviewed by me.  This patient was seen by [name], FNP-C in collaboration with Dr. Beverely Risen as a part of collaborative care agreement.   Luetta Piazza R. Tedd Sias, MSN, FNP-C Internal medicine

## 2023-03-11 ENCOUNTER — Ambulatory Visit (INDEPENDENT_AMBULATORY_CARE_PROVIDER_SITE_OTHER): Payer: PRIVATE HEALTH INSURANCE | Admitting: Nurse Practitioner

## 2023-03-11 ENCOUNTER — Encounter: Payer: Self-pay | Admitting: Nurse Practitioner

## 2023-03-11 ENCOUNTER — Ambulatory Visit
Admission: RE | Admit: 2023-03-11 | Discharge: 2023-03-11 | Disposition: A | Discharge status: Home / Self Care | Payer: PRIVATE HEALTH INSURANCE | Source: Ambulatory Visit | Attending: Nurse Practitioner | Admitting: Nurse Practitioner

## 2023-03-11 VITALS — BP 150/96 | HR 96 | Temp 98.2°F | Resp 16 | Ht 64.0 in | Wt 211.0 lb

## 2023-03-11 DIAGNOSIS — R109 Unspecified abdominal pain: Secondary | ICD-10-CM

## 2023-03-11 DIAGNOSIS — R300 Dysuria: Secondary | ICD-10-CM

## 2023-03-11 DIAGNOSIS — N390 Urinary tract infection, site not specified: Secondary | ICD-10-CM

## 2023-03-11 DIAGNOSIS — N12 Tubulo-interstitial nephritis, not specified as acute or chronic: Secondary | ICD-10-CM

## 2023-03-11 LAB — POCT URINALYSIS DIPSTICK
Bilirubin, UA: NEGATIVE
Blood, UA: NEGATIVE
Glucose, UA: NEGATIVE
Nitrite, UA: NEGATIVE
Protein, UA: NEGATIVE
Spec Grav, UA: 1.01 (ref 1.010–1.025)
Urobilinogen, UA: 0.2 E.U./dL
pH, UA: 5 (ref 5.0–8.0)

## 2023-03-11 MED ORDER — CIPROFLOXACIN HCL 500 MG PO TABS
500.0000 mg | ORAL_TABLET | Freq: Two times a day (BID) | ORAL | 0 refills | Status: AC
Start: 2023-03-11 — End: 2023-03-21

## 2023-03-11 MED ORDER — HYDROCODONE-ACETAMINOPHEN 5-325 MG PO TABS: 1.0000 | ORAL_TABLET | Freq: Four times a day (QID) | ORAL | 0 refills | Status: DC | PRN

## 2023-03-11 MED ORDER — PHENAZOPYRIDINE HCL 200 MG PO TABS: 200.0000 mg | ORAL_TABLET | Freq: Three times a day (TID) | ORAL | 0 refills | Status: DC | PRN

## 2023-03-11 NOTE — Progress Notes (Signed)
[facility] [address] [place], [place] [address]  Internal MEDICINE  Office Visit Note  Patient Name: Kathleen [name]  [id number]  [id number]  Date of Service: 03/11/2023  Chief Complaint  Patient presents with   Acute Visit     HPI Kathleen [name] presents for an acute sick visit for severe back pain/ right flank pain --onset: was on Monday Thought was UTI, right side flank pain, guarding, cannot move well due to pain. Has nausea, poor appetite.  Drank water and cranberry juice and took cranberry pills. .     Current Medication:  Outpatient Encounter Medications as of 03/11/2023  Medication Sig   aspirin EC 81 MG tablet Take 1 tablet (81 mg total) by mouth daily. Swallow whole.   carvedilol (COREG) 25 MG tablet Take 1 tablet (25 mg total) by mouth 2 (two) times daily.   cholecalciferol (VITAMIN D3) 25 MCG (1000 UNIT) tablet Take 1,000 Units by mouth daily.   [EXPIRED] ciprofloxacin (CIPRO) 500 MG tablet Take 1 tablet (500 mg total) by mouth 2 (two) times daily for 10 days. Take with food   clotrimazole-betamethasone (LOTRISONE) cream Apply 1 Application topically 2 (two) times daily.   Collagen-Vitamin C 1000-10 MG TABS Take 1 tablet by mouth daily.   Cyanocobalamin (VITAMIN B12 PO) Take 1 tablet by mouth daily.   meloxicam (MOBIC) 7.5 MG tablet Take 1 tablet (7.5 mg total) by mouth daily.   Menthol, Topical Analgesic, (BIOFREEZE ROLL-ON EX) Apply topically as needed.   Misc Natural Products (OSTEO BI-FLEX/5-LOXIN ADVANCED PO) Take 2 tablets by mouth daily.   Multiple Vitamins-Minerals (ONE-A-DAY ENERGY PO) Take 1 tablet by mouth daily.   Olmesartan-amLODIPine-HCTZ 40-5-12.5 MG TABS Take 1 tablet by mouth daily.   Omega-3 Fatty Acids (FISH OIL) 1000 MG CAPS Take 1 capsule by mouth at bedtime.   ondansetron (ZOFRAN-ODT) 4 MG disintegrating tablet Take 1 tablet (4 mg total) by mouth every 8 (eight) hours as needed for nausea or vomiting.   phenazopyridine (PYRIDIUM)  200 MG tablet Take 1 tablet (200 mg total) by mouth 3 (three) times daily as needed for pain.   rosuvastatin (CRESTOR) 5 MG tablet Take 1 tablet (5 mg total) by mouth every other day.   tiZANidine (ZANAFLEX) 4 MG tablet Take 1 tablet (4 mg total) by mouth at bedtime as needed for muscle spasms.   [DISCONTINUED] HYDROcodone-acetaminophen (NORCO/VICODIN) 5-325 MG tablet Take 1 tablet by mouth every 6 (six) hours as needed for severe pain.   No facility-administered encounter medications on file as of 03/11/2023.      Medical History: Past Medical History:  Diagnosis Date   Hypertension    Rheumatoid arthritis (HCC)      Vital Signs: BP (!) 150/96   Pulse 96   Temp 98.2 F (36.8 C)   Resp 16   Ht 5\' 4"  (1.626 m)   Wt 211 lb (95.7 kg)   LMP 01/02/2016   SpO2 95%   BMI 36.22 kg/m    Review of Systems  Constitutional:  Positive for appetite change and fatigue.  Respiratory:  Negative for cough, chest tightness, shortness of breath and wheezing.   Cardiovascular:  Negative for chest pain and palpitations.  Gastrointestinal:  Positive for nausea.  Genitourinary:  Positive for dysuria, flank pain, frequency and urgency. Negative for hematuria.    Physical Exam Vitals reviewed.  Constitutional:      General: She is not in acute distress.    Appearance: Normal appearance. She is obese. She is  ill-appearing.  HENT:     Head: Normocephalic and atraumatic.  Eyes:     Pupils: Pupils are equal, round, and reactive to light.  Cardiovascular:     Rate and Rhythm: Normal rate and regular rhythm.  Pulmonary:     Effort: Pulmonary effort is normal. No respiratory distress.  Abdominal:     Tenderness: There is abdominal tenderness. There is right CVA tenderness and guarding.  Neurological:     Mental Status: She is alert and oriented to person, place, and time.  Psychiatric:        Mood and Affect: Mood normal.        Behavior: Behavior normal.       Assessment/Plan: 1.  Acute right flank pain Consistent with right pyelonephritis and/or right nephrolithiasis. Empiric cipro prescribed as well as pyridium for bladder spasms. Renal ultrasound ordered to rule out kidney stone. Hydrocodone also prescribed for severe pain.  - ciprofloxacin (CIPRO) 500 MG tablet; Take 1 tablet (500 mg total) by mouth 2 (two) times daily for 10 days. Take with food  Dispense: 20 tablet; Refill: 0 - phenazopyridine (PYRIDIUM) 200 MG tablet; Take 1 tablet (200 mg total) by mouth 3 (three) times daily as needed for pain.  Dispense: 15 tablet; Refill: 0 - US Renal; Future  2. Pyelonephritis of right kidney Symptoms and assessment consistent with pyelonephritis. Urinalysis only positive for blood, urine culture sent to lab. Cipro and pyridium ordered  - POCT urinalysis dipstick - ciprofloxacin (CIPRO) 500 MG tablet; Take 1 tablet (500 mg total) by mouth 2 (two) times daily for 10 days. Take with food  Dispense: 20 tablet; Refill: 0 - phenazopyridine (PYRIDIUM) 200 MG tablet; Take 1 tablet (200 mg total) by mouth 3 (three) times daily as needed for pain.  Dispense: 15 tablet; Refill: 0 - CULTURE, URINE COMPREHENSIVE  3. Dysuria Urinalysis done, positive for blood, urine culture sent. Pyridium, hydrocodone and cipro prescribed.  - POCT urinalysis dipstick - phenazopyridine (PYRIDIUM) 200 MG tablet; Take 1 tablet (200 mg total) by mouth 3 (three) times daily as needed for pain.  Dispense: 15 tablet; Refill: 0 - CULTURE, URINE COMPREHENSIVE   General Counseling: Kathleen [name] verbalizes understanding of the findings of todays visit and agrees with plan of treatment. I have discussed any further diagnostic evaluation that may be needed or ordered today. We also reviewed her medications today. she has been encouraged to call the office with any questions or concerns that should arise related to todays visit.    Counseling:    Orders Placed This Encounter  Procedures   CULTURE, URINE  COMPREHENSIVE   US Renal   POCT urinalysis dipstick    Meds ordered this encounter  Medications   ciprofloxacin (CIPRO) 500 MG tablet    Sig: Take 1 tablet (500 mg total) by mouth 2 (two) times daily for 10 days. Take with food    Dispense:  20 tablet    Refill:  0    Fill new script now.   phenazopyridine (PYRIDIUM) 200 MG tablet    Sig: Take 1 tablet (200 mg total) by mouth 3 (three) times daily as needed for pain.    Dispense:  15 tablet    Refill:  0   DISCONTD: HYDROcodone-acetaminophen (NORCO/VICODIN) 5-325 MG tablet    Sig: Take 1 tablet by mouth every 6 (six) hours as needed for severe pain.    Dispense:  20 tablet    Refill:  0    Fill new script asap  Return if symptoms worsen or fail to improve, for go to ER if symptoms worsen or pain becomes unbearable. .  [place] Controlled Substance Database was reviewed by me for overdose risk score (ORS)  Time spent: 30 Minutes Time spent with patient included reviewing progress notes, labs, imaging studies, and discussing plan for follow up.   This patient was seen by [name], FNP-C in collaboration with Dr. Beverely Risen as a part of collaborative care agreement.  Robyn Nohr R. Tedd Sias, MSN, FNP-C Internal Medicine

## 2023-03-14 LAB — CULTURE, URINE COMPREHENSIVE

## 2023-03-15 ENCOUNTER — Telehealth: Payer: Self-pay

## 2023-03-15 ENCOUNTER — Other Ambulatory Visit: Payer: Self-pay | Admitting: Nurse Practitioner

## 2023-03-15 DIAGNOSIS — N200 Calculus of kidney: Secondary | ICD-10-CM

## 2023-03-15 DIAGNOSIS — R109 Unspecified abdominal pain: Secondary | ICD-10-CM

## 2023-03-15 NOTE — Telephone Encounter (Signed)
Please send referral for urgent due to pt is having terrible  pain

## 2023-03-15 NOTE — Progress Notes (Signed)
She had a 1.2 cm kidney stone in the right kidney. It is non-obstructing. I recommend she still finish the antibiotic. If the pain has not improved at all of has gotten worse, we may need to refer to urology.

## 2023-03-15 NOTE — Telephone Encounter (Signed)
As per Kathleen [name] its ok to give pt work note

## 2023-03-15 NOTE — Telephone Encounter (Signed)
-----   Message from [place] sent at 03/15/2023  8:28 AM EDT ----- She had a 1.2 cm kidney stone in the right kidney. It is non-obstructing. I recommend she still finish the antibiotic. If the pain has not improved at all of has gotten worse, we may need to refer to urology.

## 2023-03-15 NOTE — Telephone Encounter (Signed)
Pt advised for U/S result and send alyssa message that please send urgent referral for urology

## 2023-03-16 ENCOUNTER — Telehealth: Payer: Self-pay | Admitting: Nurse Practitioner

## 2023-03-16 NOTE — Telephone Encounter (Signed)
Urology referral sent via Epic to [facility]. Notified patient. Gave pt telephone # [phone]

## 2023-03-19 ENCOUNTER — Telehealth: Payer: Self-pay

## 2023-03-19 MED ORDER — OXYCODONE-ACETAMINOPHEN 5-325 MG PO TABS: 1.0000 | ORAL_TABLET | Freq: Four times a day (QID) | ORAL | 0 refills | Status: DC | PRN

## 2023-03-22 ENCOUNTER — Encounter: Payer: Self-pay | Admitting: Nurse Practitioner

## 2023-03-22 DIAGNOSIS — X503XXA Overexertion from repetitive movements, initial encounter: Secondary | ICD-10-CM | POA: Insufficient documentation

## 2023-03-22 DIAGNOSIS — I509 Heart failure, unspecified: Secondary | ICD-10-CM | POA: Insufficient documentation

## 2023-03-23 ENCOUNTER — Telehealth: Payer: Self-pay

## 2023-03-23 ENCOUNTER — Other Ambulatory Visit: Payer: Self-pay | Admitting: Nurse Practitioner

## 2023-03-23 MED ORDER — OXYCODONE-ACETAMINOPHEN 5-325 MG PO TABS: 1.0000 | ORAL_TABLET | Freq: Four times a day (QID) | ORAL | 0 refills | Status: DC | PRN

## 2023-03-23 MED ORDER — HYDROCODONE-ACETAMINOPHEN 5-325 MG PO TABS: 1.0000 | ORAL_TABLET | Freq: Four times a day (QID) | ORAL | 0 refills | Status: DC | PRN

## 2023-03-23 NOTE — Addendum Note (Signed)
Addended by: [name] on: 03/23/2023 08:37 AM   Modules accepted: Orders

## 2023-03-23 NOTE — Telephone Encounter (Signed)
Pt was notified that Kathleen [name] send in oxyCODONE-acetaminophen.

## 2023-03-23 NOTE — Telephone Encounter (Signed)
Pt advised we sent med  

## 2023-03-28 ENCOUNTER — Encounter: Payer: Self-pay | Admitting: Nurse Practitioner

## 2023-03-30 ENCOUNTER — Ambulatory Visit
Admission: RE | Admit: 2023-03-30 | Discharge: 2023-03-30 | Disposition: A | Discharge status: Home / Self Care | Payer: PRIVATE HEALTH INSURANCE | Source: Ambulatory Visit | Attending: Urology

## 2023-03-30 ENCOUNTER — Ambulatory Visit
Admission: RE | Admit: 2023-03-30 | Discharge: 2023-03-30 | Disposition: A | Discharge status: Home / Self Care | Payer: PRIVATE HEALTH INSURANCE | Source: Ambulatory Visit | Attending: Urology | Admitting: Urology

## 2023-03-30 ENCOUNTER — Telehealth: Payer: Self-pay

## 2023-03-30 ENCOUNTER — Encounter: Payer: Self-pay | Admitting: Urology

## 2023-03-30 ENCOUNTER — Other Ambulatory Visit: Payer: Self-pay | Admitting: *Deleted

## 2023-03-30 ENCOUNTER — Other Ambulatory Visit
Admission: RE | Admit: 2023-03-30 | Discharge: 2023-03-30 | Disposition: A | Discharge status: Home / Self Care | Payer: PRIVATE HEALTH INSURANCE | Source: Home / Self Care | Attending: Urology | Admitting: Urology

## 2023-03-30 ENCOUNTER — Ambulatory Visit (INDEPENDENT_AMBULATORY_CARE_PROVIDER_SITE_OTHER): Payer: PRIVATE HEALTH INSURANCE | Admitting: Urology

## 2023-03-30 ENCOUNTER — Ambulatory Visit
Admission: RE | Admit: 2023-03-30 | Discharge: 2023-03-30 | Disposition: A | Discharge status: Home / Self Care | Payer: PRIVATE HEALTH INSURANCE | Attending: Urology | Admitting: Urology

## 2023-03-30 VITALS — BP 144/76 | HR 89 | Ht 64.0 in | Wt 207.6 lb

## 2023-03-30 DIAGNOSIS — N200 Calculus of kidney: Secondary | ICD-10-CM

## 2023-03-30 DIAGNOSIS — R1012 Left upper quadrant pain: Secondary | ICD-10-CM | POA: Insufficient documentation

## 2023-03-30 DIAGNOSIS — R93421 Abnormal radiologic findings on diagnostic imaging of right kidney: Secondary | ICD-10-CM | POA: Diagnosis not present

## 2023-03-30 LAB — URINALYSIS, COMPLETE (UACMP) WITH MICROSCOPIC
Bacteria, UA: NONE SEEN
Bilirubin Urine: NEGATIVE
Glucose, UA: NEGATIVE mg/dL
Nitrite: NEGATIVE
Protein, ur: 30 mg/dL — AB
RBC / HPF: NONE SEEN RBC/hpf (ref 0–5)
Specific Gravity, Urine: >1.03 — ABNORMAL HIGH (ref 1.005–1.030)
pH: 5.5 (ref 5.0–8.0)

## 2023-03-30 NOTE — Telephone Encounter (Signed)
Called pt no answer. Left detailed message for pt informing her of the information below. Advised pt to call back with questions or concerns.

## 2023-03-30 NOTE — Telephone Encounter (Signed)
-----   Message from Sondra Come sent at 03/30/2023  1:40 PM EDT ----- Regarding: CT Please let her know we do not see any kidney stones or blockage on her CT.  The final read is pending and we will call if any other abnormalities are seen, I also reached out to her PCP to work on the next steps for her back pain  Legrand Rams, MD 03/30/2023

## 2023-03-30 NOTE — Progress Notes (Signed)
   03/30/23 1:27 PM   Christin Bach 08/30/1965 [id number]  CC: Right flank pain, possible kidney stone  HPI: 57 year old female who reports at least 7 months of right-sided back pain, she also thinks she may have seen some blood in the urine though urinalysis has been benign.  She saw her PCP who ordered a ultrasound on 03/11/2023 that showed a possible 1 cm right renal pelvis stone but no hydronephrosis.  She was referred to urology for further evaluation.  Her pain seems more positional and MSK in nature.  She denies any urinary symptoms aside from urgency and frequency.  I personally viewed and interpreted her KUB today that shows no definitive evidence of stones, urinalysis today is benign.  PMH: Past Medical History:  Diagnosis Date   Hypertension    Kidney stone    Rheumatoid arthritis (HCC)     Surgical History: Past Surgical History:  Procedure Laterality Date   CESAREAN SECTION     3 cesarean sections    Family History: Family History  Problem Relation Age of Onset   Cancer Mother        Female   Hypertension Mother     Social History:  reports that she has been smoking cigarettes. She has never used smokeless tobacco. She reports that she does not drink alcohol and does not use drugs.  Physical Exam: BP (!) 144/76 (BP Location: Left Arm, Patient Position: Sitting, Cuff Size: Large)   Pulse 89   Ht 5\' 4"  (1.626 m)   Wt 207 lb 9.6 oz (94.2 kg)   LMP 01/02/2016   BMI 35.63 kg/m    Constitutional:  Alert and oriented, No acute distress. Cardiovascular: No clubbing, cyanosis, or edema. Respiratory: Normal respiratory effort, no increased work of breathing. GI: Abdomen is soft, nontender, nondistended, no abdominal masses   Laboratory Data: Reviewed  Pertinent Imaging: I have personally viewed and interpreted the KUB which shows no evidence of stones, as well as the stat CT that I ordered today that shows no hydronephrosis or stone disease  Assessment &  Plan:   57 year old female with 7 months of right-sided back pain of unclear etiology.  CT today shows no evidence of stone disease or hydronephrosis, would recommend investigating other possible etiologies of her symptoms.  Can follow-up with urology as needed   Legrand Rams, MD 03/30/2023  [facility] [address], [address] [place], [place] [address] [phone]

## 2023-03-31 ENCOUNTER — Emergency Department (HOSPITAL_COMMUNITY): Payer: PRIVATE HEALTH INSURANCE

## 2023-03-31 ENCOUNTER — Observation Stay (HOSPITAL_COMMUNITY): Payer: PRIVATE HEALTH INSURANCE

## 2023-03-31 ENCOUNTER — Other Ambulatory Visit: Payer: Self-pay

## 2023-03-31 ENCOUNTER — Encounter (HOSPITAL_COMMUNITY): Payer: Self-pay

## 2023-03-31 ENCOUNTER — Observation Stay (HOSPITAL_COMMUNITY)
Admission: EM | Admit: 2023-03-31 | Discharge: 2023-04-02 | Disposition: A | Discharge status: Home / Self Care | Payer: PRIVATE HEALTH INSURANCE | Attending: Emergency Medicine | Admitting: Emergency Medicine

## 2023-03-31 DIAGNOSIS — N6320 Unspecified lump in the left breast, unspecified quadrant: Secondary | ICD-10-CM | POA: Insufficient documentation

## 2023-03-31 DIAGNOSIS — K6389 Other specified diseases of intestine: Secondary | ICD-10-CM | POA: Diagnosis not present

## 2023-03-31 DIAGNOSIS — Z7982 Long term (current) use of aspirin: Secondary | ICD-10-CM | POA: Insufficient documentation

## 2023-03-31 DIAGNOSIS — E1151 Type 2 diabetes mellitus with diabetic peripheral angiopathy without gangrene: Secondary | ICD-10-CM

## 2023-03-31 DIAGNOSIS — R9389 Abnormal findings on diagnostic imaging of other specified body structures: Secondary | ICD-10-CM | POA: Diagnosis not present

## 2023-03-31 DIAGNOSIS — I10 Essential (primary) hypertension: Secondary | ICD-10-CM | POA: Diagnosis not present

## 2023-03-31 DIAGNOSIS — F17210 Nicotine dependence, cigarettes, uncomplicated: Secondary | ICD-10-CM | POA: Insufficient documentation

## 2023-03-31 DIAGNOSIS — Z79899 Other long term (current) drug therapy: Secondary | ICD-10-CM | POA: Diagnosis not present

## 2023-03-31 DIAGNOSIS — I739 Peripheral vascular disease, unspecified: Secondary | ICD-10-CM

## 2023-03-31 DIAGNOSIS — X503XXA Overexertion from repetitive movements, initial encounter: Secondary | ICD-10-CM

## 2023-03-31 DIAGNOSIS — R109 Unspecified abdominal pain: Secondary | ICD-10-CM | POA: Diagnosis present

## 2023-03-31 DIAGNOSIS — J4489 Other specified chronic obstructive pulmonary disease: Principal | ICD-10-CM

## 2023-03-31 DIAGNOSIS — R52 Pain, unspecified: Secondary | ICD-10-CM | POA: Diagnosis not present

## 2023-03-31 DIAGNOSIS — J42 Unspecified chronic bronchitis: Secondary | ICD-10-CM | POA: Diagnosis not present

## 2023-03-31 LAB — CBC
HCT: 43 % (ref 36.0–46.0)
Hemoglobin: 14.1 g/dL (ref 12.0–15.0)
MCH: 30.6 pg (ref 26.0–34.0)
MCHC: 32.8 g/dL (ref 30.0–36.0)
MCV: 93.3 fL (ref 80.0–100.0)
Platelets: 351 10*3/uL (ref 150–400)
RBC: 4.61 MIL/uL (ref 3.87–5.11)
RDW: 13.2 % (ref 11.5–15.5)
WBC: 7.8 10*3/uL (ref 4.0–10.5)
nRBC: 0 % (ref 0.0–0.2)

## 2023-03-31 LAB — HEPATIC FUNCTION PANEL
ALT: 18 U/L (ref 0–44)
AST: 19 U/L (ref 15–41)
Albumin: 3.9 g/dL (ref 3.5–5.0)
Alkaline Phosphatase: 68 U/L (ref 38–126)
Bilirubin, Direct: <0.1 mg/dL (ref 0.0–0.2)
Total Bilirubin: 0.3 mg/dL (ref 0.3–1.2)
Total Protein: 7.8 g/dL (ref 6.5–8.1)

## 2023-03-31 LAB — BASIC METABOLIC PANEL
Anion gap: 11 (ref 5–15)
BUN: 18 mg/dL (ref 6–20)
CO2: 24 mmol/L (ref 22–32)
Calcium: 11 mg/dL — ABNORMAL HIGH (ref 8.9–10.3)
Chloride: 103 mmol/L (ref 98–111)
Creatinine, Ser: 1.01 mg/dL — ABNORMAL HIGH (ref 0.44–1.00)
GFR, Estimated: >60 mL/min (ref >60)
Glucose, Bld: 123 mg/dL — ABNORMAL HIGH (ref 70–99)
Potassium: 4 mmol/L (ref 3.5–5.1)
Sodium: 138 mmol/L (ref 135–145)

## 2023-03-31 MED ORDER — HYDRALAZINE HCL 20 MG/ML IJ SOLN: 10.0000 mg | Freq: Four times a day (QID) | INTRAMUSCULAR | Status: DC | PRN

## 2023-03-31 MED ORDER — MORPHINE SULFATE (PF) 2 MG/ML IV SOLN
2.0000 mg | Freq: Once | INTRAVENOUS | Status: AC
  Administered 2023-03-31: 2 mg via INTRAVENOUS
  Filled 2023-03-31: qty 1

## 2023-03-31 MED ORDER — ASPIRIN 81 MG PO TBEC
81.0000 mg | DELAYED_RELEASE_TABLET | Freq: Every day | ORAL | Status: DC
  Administered 2023-03-31 – 2023-04-02 (×3): 81 mg via ORAL
  Filled 2023-03-31 (×3): qty 1

## 2023-03-31 MED ORDER — GADOBUTROL 1 MMOL/ML IV SOLN
9.0000 mL | Freq: Once | INTRAVENOUS | Status: AC | PRN
  Administered 2023-03-31: 9 mL via INTRAVENOUS

## 2023-03-31 MED ORDER — ALBUTEROL SULFATE (2.5 MG/3ML) 0.083% IN NEBU
2.5000 mg | INHALATION_SOLUTION | Freq: Four times a day (QID) | RESPIRATORY_TRACT | Status: DC
  Filled 2023-03-31: qty 3

## 2023-03-31 MED ORDER — ACETAMINOPHEN 325 MG PO TABS: 650.0000 mg | ORAL_TABLET | Freq: Four times a day (QID) | ORAL | Status: DC | PRN

## 2023-03-31 MED ORDER — ONDANSETRON HCL 4 MG/2ML IJ SOLN: 4.0000 mg | Freq: Once | INTRAMUSCULAR | Status: DC

## 2023-03-31 MED ORDER — ALBUTEROL SULFATE (2.5 MG/3ML) 0.083% IN NEBU: 2.5000 mg | INHALATION_SOLUTION | Freq: Four times a day (QID) | RESPIRATORY_TRACT | Status: DC | PRN

## 2023-03-31 MED ORDER — ACETAMINOPHEN 650 MG RE SUPP: 650.0000 mg | Freq: Four times a day (QID) | RECTAL | Status: DC | PRN

## 2023-03-31 MED ORDER — AMLODIPINE BESYLATE 5 MG PO TABS
5.0000 mg | ORAL_TABLET | Freq: Every day | ORAL | Status: DC
  Administered 2023-03-31 – 2023-04-02 (×3): 5 mg via ORAL
  Filled 2023-03-31 (×3): qty 1

## 2023-03-31 MED ORDER — SODIUM CHLORIDE 0.9 % IV SOLN: INTRAVENOUS | Status: DC

## 2023-03-31 MED ORDER — FENTANYL CITRATE PF 50 MCG/ML IJ SOSY
50.0000 ug | PREFILLED_SYRINGE | Freq: Once | INTRAMUSCULAR | Status: AC
  Administered 2023-03-31: 50 ug via INTRAVENOUS
  Filled 2023-03-31: qty 1

## 2023-03-31 MED ORDER — ROSUVASTATIN CALCIUM 5 MG PO TABS
5.0000 mg | ORAL_TABLET | ORAL | Status: DC
  Administered 2023-03-31 – 2023-04-02 (×2): 5 mg via ORAL
  Filled 2023-03-31 (×3): qty 1

## 2023-03-31 MED ORDER — CARVEDILOL 6.25 MG PO TABS
3.1250 mg | ORAL_TABLET | Freq: Two times a day (BID) | ORAL | Status: DC
  Administered 2023-03-31 – 2023-04-02 (×5): 3.125 mg via ORAL
  Filled 2023-03-31 (×5): qty 1

## 2023-03-31 MED ORDER — ONDANSETRON HCL 4 MG/2ML IJ SOLN
4.0000 mg | Freq: Four times a day (QID) | INTRAMUSCULAR | Status: DC | PRN
  Administered 2023-03-31: 4 mg via INTRAVENOUS
  Filled 2023-03-31: qty 2

## 2023-03-31 MED ORDER — IOHEXOL 350 MG/ML SOLN
75.0000 mL | Freq: Once | INTRAVENOUS | Status: AC | PRN
  Administered 2023-03-31: 75 mL via INTRAVENOUS

## 2023-03-31 MED ORDER — MEGESTROL ACETATE 40 MG PO TABS
40.0000 mg | ORAL_TABLET | Freq: Two times a day (BID) | ORAL | Status: DC
  Administered 2023-03-31 – 2023-04-02 (×4): 40 mg via ORAL
  Filled 2023-03-31 (×4): qty 1

## 2023-03-31 MED ORDER — MORPHINE SULFATE (PF) 2 MG/ML IV SOLN: 1.0000 mg | INTRAVENOUS | Status: DC | PRN

## 2023-03-31 MED ORDER — OXYCODONE HCL 5 MG PO TABS
5.0000 mg | ORAL_TABLET | ORAL | Status: DC | PRN
  Administered 2023-03-31 – 2023-04-01 (×3): 5 mg via ORAL
  Filled 2023-03-31 (×3): qty 1

## 2023-03-31 NOTE — ED Provider Notes (Signed)
[place] EMERGENCY DEPARTMENT AT [facility] Provider Note   CSN: [id number] Arrival date & time: 03/31/23  0631     History  Chief Complaint  Patient presents with   Flank Pain    Kathleen [name] is a 57 y.o. female.  HPI 57 yo female presents complaining of right flank pain that began two weeks ago.  She was initially seen by her pmd.  She was referred to urology.  She has been on po pain meds.  She was sent for f/u with urology yesterday.  She had a ct stone study and was told there was no kidney stone. She has had ongoing and worsening pain from right low back to right side of abdomen.  She has had subjective fever, chills, and associated nausea and vomiting.  She endorses some vaginal spotting and has been post menopausal for 7 years with history of fibroids.       Home Medications Prior to Admission medications   Medication Sig Start Date End Date Taking? Authorizing Provider  aspirin EC 81 MG tablet Take 1 tablet (81 mg total) by mouth daily. Swallow whole. 03/28/21   Pricilla Loveless, MD  carvedilol (COREG) 25 MG tablet Take 1 tablet (25 mg total) by mouth 2 (two) times daily. 09/02/22   [name], NP  cholecalciferol (VITAMIN D3) 25 MCG (1000 UNIT) tablet Take 1,000 Units by mouth daily.    [provider]  clotrimazole-betamethasone (LOTRISONE) cream Apply 1 Application topically 2 (two) times daily. 09/02/22   [name], NP  Collagen-Vitamin C 1000-10 MG TABS Take 1 tablet by mouth daily.    [provider]  Cyanocobalamin (VITAMIN B12 PO) Take 1 tablet by mouth daily.    [provider]  meloxicam (MOBIC) 7.5 MG tablet Take 1 tablet (7.5 mg total) by mouth daily. 03/03/23   [name], NP  Menthol, Topical Analgesic, (BIOFREEZE ROLL-ON EX) Apply topically as needed.    [provider]  Misc Natural Products (OSTEO BI-FLEX/5-LOXIN ADVANCED PO) Take 2 tablets by mouth daily.    [provider]   Multiple Vitamins-Minerals (ONE-A-DAY ENERGY PO) Take 1 tablet by mouth daily.    [provider]  Olmesartan-amLODIPine-HCTZ 40-5-12.5 MG TABS Take 1 tablet by mouth daily. 11/19/22   Iran Ouch, MD  Omega-3 Fatty Acids (FISH OIL) 1000 MG CAPS Take 1 capsule by mouth at bedtime.    [provider]  ondansetron (ZOFRAN-ODT) 4 MG disintegrating tablet Take 1 tablet (4 mg total) by mouth every 8 (eight) hours as needed for nausea or vomiting. 07/20/22   LampteyBritta Mccreedy, MD  oxyCODONE-acetaminophen (PERCOCET/ROXICET) 5-325 MG tablet Take 1 tablet by mouth every 6 (six) hours as needed for severe pain. 03/23/23   [name], NP  phenazopyridine (PYRIDIUM) 200 MG tablet Take 1 tablet (200 mg total) by mouth 3 (three) times daily as needed for pain. 03/11/23   [name], NP  rosuvastatin (CRESTOR) 5 MG tablet Take 1 tablet (5 mg total) by mouth every other day. 09/02/22   [name], NP  tiZANidine (ZANAFLEX) 4 MG tablet Take 1 tablet (4 mg total) by mouth at bedtime as needed for muscle spasms. 03/03/23   [name], NP      Allergies    Bee venom and Penicillins    Review of Systems   Review of Systems  Physical Exam Updated Vital Signs BP (!) 171/128   Pulse 90   Temp 97.9 F (36.6 C) (Oral)  Resp 16   Ht 1.626 m (5\' 4" )   Wt 94.2 kg   LMP 01/02/2016   SpO2 100%   BMI 35.63 kg/m  Physical Exam Vitals and nursing note reviewed.  HENT:     Head: Normocephalic.     Right Ear: External ear normal.     Left Ear: External ear normal.     Nose: Nose normal.     Mouth/Throat:     Pharynx: Oropharynx is clear.  Eyes:     Conjunctiva/sclera: Conjunctivae normal.  Cardiovascular:     Rate and Rhythm: Normal rate and regular rhythm.     Pulses: Normal pulses.  Pulmonary:     Effort: Pulmonary effort is normal.  Abdominal:     Tenderness: There is abdominal tenderness.     Comments: Diffuse ttp throughout right side of abdomen   Musculoskeletal:        General: Normal range of motion.     Cervical back: Normal range of motion.  Skin:    General: Skin is warm.     Capillary Refill: Capillary refill takes less than 2 seconds.  Neurological:     General: No focal deficit present.     Mental Status: She is alert.  Psychiatric:        Mood and Affect: Mood normal.     ED Results / Procedures / Treatments   Labs (all labs ordered are listed, but only abnormal results are displayed) Labs Reviewed  BASIC METABOLIC PANEL - Abnormal; Notable for the following components:      Result Value   Glucose, Bld 123 (*)    Creatinine, Ser 1.01 (*)    Calcium 11.0 (*)    All other components within normal limits  CBC  URINALYSIS, ROUTINE W REFLEX MICROSCOPIC  HEPATIC FUNCTION PANEL    EKG None  Radiology CT RENAL STONE STUDY  Result Date: 03/30/2023 CLINICAL DATA:  Abdominal/flank pain EXAM: CT ABDOMEN AND PELVIS WITHOUT CONTRAST TECHNIQUE: Multidetector CT imaging of the abdomen and pelvis was performed following the standard protocol without IV contrast. RADIATION DOSE REDUCTION: This exam was performed according to the departmental dose-optimization program which includes automated exposure control, adjustment of the mA and/or kV according to patient size and/or use of iterative reconstruction technique. COMPARISON:  02/04/2014 FINDINGS: Lower chest: No acute abnormality. Hepatobiliary: No focal liver abnormality is seen. No gallstones, gallbladder wall thickening, or biliary dilatation. Pancreas: Unremarkable. No pancreatic ductal dilatation or surrounding inflammatory changes. Spleen: Normal in size without focal abnormality. Adrenals/Urinary Tract: Normal adrenal glands. No hydronephrosis identified bilaterally. Punctate stone within the upper pole of the left kidney noted, image 76/4. No hydroureter or ureteral calculi. The urinary bladder appears within normal limits. Stomach/Bowel: Stomach is normal. The appendix  is visualized and is within normal limits. Nonspecific asymmetric wall thickening involving the distal sigmoid colon is identified, image 68/2. Difficult to characterize within partially decompressed and unprepped colon. No signs of bowel wall inflammation. Vascular/Lymphatic: Aortic atherosclerosis. No signs of abdominopelvic adenopathy. Reproductive: There is diffuse thickening of the endometrium which measures approximately 2.5 cm, image 70/5. This is considered an abnormal finding, particularly in a postmenopausal female. No adnexal mass. Other: No free fluid or fluid collections. Musculoskeletal: No acute or significant osseous findings. Severe right hip osteoarthritis. IMPRESSION: 1. No acute findings within the abdomen or pelvis. 2. Punctate nonobstructing left renal calculus. 3. Diffuse thickening of the endometrium which measures approximately 2.5 cm. This is considered an abnormal finding, particularly in a postmenopausal female. Recommend  further evaluation with pelvic ultrasound. 4. Nonspecific asymmetric wall thickening involving the distal sigmoid colon. Difficult to characterize within partially decompressed and unprepped colon. Correlation with colon cancer screening is recommended. 5.  Aortic Atherosclerosis (ICD10-I70.0). Electronically Signed   By: Signa Kell M.D.   On: 03/30/2023 14:47    Procedures Procedures    Medications Ordered in ED Medications  fentaNYL (SUBLIMAZE) injection 50 mcg (50 mcg Intravenous Given 03/31/23 8469)    ED Course/ Medical Decision Making/ A&P Clinical Course as of 03/31/23 1050  Wed Mar 31, 2023  1023 MPRESSION: Thickened and heterogeneous endometrium. Possibilities include an aggressive or neoplastic process and recommend further evaluation.  Poor visualization of the ovaries.  No free fluid in the pelvis.  MPRESSION: Thickened and heterogeneous endometrium. Possibilities include an aggressive or neoplastic process and recommend further  evaluation.  Poor visualization of the ovaries.  No free fluid in the pelvis.  Reviewed ultrasound and radiologist interpretation notes thickened irregular endometrium     [DR]  1023 MPRESSION: Thickened and heterogeneous endometrium. Possibilities include an aggressive or neoplastic process and recommend further evaluation.  Poor visualization of the ovaries.  No free fluid in the pelvis.     [DR]  1024 CT abdomen and pelvis and chest reviewed please see radiologist interpretation [DR]    Clinical Course User Index [DR] Margarita Grizzle, MD                                 Medical Decision Making Amount and/or Complexity of Data Reviewed Labs: ordered. Radiology: ordered.  Risk Prescription drug management.   57 year old female presents today with right side abdominal pain.  On evaluation here she has abnormalities noted of the endometrium on CT and ultrasound, thickening of the colon, and incidentally breast mass noted.  Care discussed with Dr. Garrel Ridgel, on-call for GYN.  He advises that patient will need further evaluation and likely endometrial biopsy.  They advised admission to medicine with OB/GYN consult.  He is messaging Dr. Debroah Loop who is on-call for GYN. Plan admission to medicine service for further evaluation and treatment       Final Clinical Impression(s) / ED Diagnoses Final diagnoses:  None    Rx / DC Orders ED Discharge Orders     None         Margarita Grizzle, MD 04/03/23 1202

## 2023-03-31 NOTE — ED Notes (Signed)
ED TO INPATIENT HANDOFF REPORT  ED Nurse Name and Phone #: Percival Spanish [phone]  S Name/Age/Gender Kathleen [name] 57 y.o. female Room/Bed: H012C/H012C  Code Status   Code Status: Full Code  Home/SNF/Other Home Patient oriented to: self, place, time, and situation Is this baseline? Yes   Triage Complete: Triage complete  Chief Complaint Intractable pain [R52]  Triage Note Pt arrives to ED c/o right sided flank pain x 3 weeks. Pt endorses previous dx of kidney stone. Pt reports blood in urine and polyuria.   Allergies Allergies  Allergen Reactions   Bee Venom Anaphylaxis and Swelling   Penicillins     Level of Care/Admitting Diagnosis ED Disposition     ED Disposition  Admit   Condition  --   Comment  Hospital Area: [facility] [[id number]]  Level of Care: Med-Surg [16]  May place patient in observation at [facility] or [facility] if equivalent level of care is available:: Yes  Covid Evaluation: Asymptomatic - no recent exposure (last 10 days) testing not required  Diagnosis: Intractable pain [708987]  Admitting Physician: Lorin Glass [[id number]]  Attending Physician: Lorin Glass [[id number]]          B Medical/Surgery History Past Medical History:  Diagnosis Date   Hypertension    Kidney stone    Rheumatoid arthritis (HCC)    Past Surgical History:  Procedure Laterality Date   CESAREAN SECTION     3 cesarean sections     A IV Location/Drains/Wounds Patient Lines/Drains/Airways Status     Active Line/Drains/Airways     Name Placement date Placement time Site Days   Peripheral IV 03/31/23 20 G Anterior;Proximal;Right Forearm 03/31/23  0750  Forearm  less than 1            Intake/Output Last 24 hours No intake or output data in the 24 hours ending 03/31/23 1305  Labs/Imaging Results for orders placed or performed during the hospital encounter of 03/31/23 (from the past 48 hour(s))  Basic metabolic panel     Status: Abnormal    Collection Time: 03/31/23  6:43 AM  Result Value Ref Range   Sodium 138 135 - 145 mmol/L   Potassium 4.0 3.5 - 5.1 mmol/L   Chloride 103 98 - 111 mmol/L   CO2 24 22 - 32 mmol/L   Glucose, Bld 123 (H) 70 - 99 mg/dL    Comment: Glucose reference range applies only to samples taken after fasting for at least 8 hours.   BUN 18 6 - 20 mg/dL   Creatinine, Ser 9.56 (H) 0.44 - 1.00 mg/dL   Calcium 21.3 (H) 8.9 - 10.3 mg/dL   GFR, Estimated >08 >65 mL/min    Comment: (NOTE) Calculated using the CKD-EPI Creatinine Equation (2021)    Anion gap 11 5 - 15    Comment: Performed at [facility], [address], [place], [place] [address]  CBC     Status: None   Collection Time: 03/31/23  6:43 AM  Result Value Ref Range   WBC 7.8 4.0 - 10.5 K/uL   RBC 4.61 3.87 - 5.11 MIL/uL   Hemoglobin 14.1 12.0 - 15.0 g/dL   HCT 62.9 52.8 - 41.3 %   MCV 93.3 80.0 - 100.0 fL   MCH 30.6 26.0 - 34.0 pg   MCHC 32.8 30.0 - 36.0 g/dL   RDW 24.4 01.0 - 27.2 %   Platelets 351 150 - 400 K/uL   nRBC 0.0 0.0 - 0.2 %  Comment: Performed at [facility], [address] [place], [place] [address]  Hepatic function panel     Status: None   Collection Time: 03/31/23  6:43 AM  Result Value Ref Range   Total Protein 7.8 6.5 - 8.1 g/dL   Albumin 3.9 3.5 - 5.0 g/dL   AST 19 15 - 41 U/L   ALT 18 0 - 44 U/L   Alkaline Phosphatase 68 38 - 126 U/L   Total Bilirubin 0.3 0.3 - 1.2 mg/dL   Bilirubin, Direct <1.3 0.0 - 0.2 mg/dL   Indirect Bilirubin NOT CALCULATED 0.3 - 0.9 mg/dL    Comment: Performed at [facility], [address] [place], [place] [address]   US PELVIS TRANSVAGINAL NON-OB (TV ONLY)  Result Date: 03/31/2023 CLINICAL DATA:  Pelvic pain.  Vaginal bleeding. EXAM: ULTRASOUND PELVIS TRANSVAGINAL TECHNIQUE: Transvaginal ultrasound examination of the pelvis was performed including evaluation of the uterus, ovaries, adnexal regions, and pelvic cul-de-sac. COMPARISON:  CT 03/31/2023.   Ultrasound September 2015 FINDINGS: Uterus Measurements: 11.2 x 5.1 x 6.7 cm = volume: 202 mL. Slightly heterogeneous myometrium. Evaluation limited by bowel gas and soft tissue. Endometrium Thickness: 29 mm. Abnormally thickened with some cystic areas. No clear abnormal flow on color Doppler. Ovaries Neither ovary is seen on this examination. This may related to overlapping bowel gas and soft tissue Other findings:  No abnormal free fluid IMPRESSION: Thickened and heterogeneous endometrium. Possibilities include an aggressive or neoplastic process and recommend further evaluation. Poor visualization of the ovaries.  No free fluid in the pelvis. Electronically Signed   By: Karen Kays M.D.   On: 03/31/2023 10:02   CT ABDOMEN PELVIS W CONTRAST  Result Date: 03/31/2023 CLINICAL DATA:  Three-week history of right flank pain, hematuria, and polyuria. EXAM: CT ANGIOGRAPHY CHEST CT ABDOMEN AND PELVIS WITH CONTRAST TECHNIQUE: Multidetector CT imaging of the chest was performed using the standard protocol during bolus administration of intravenous contrast. Multiplanar CT image reconstructions and MIPs were obtained to evaluate the vascular anatomy. Multidetector CT imaging of the abdomen and pelvis was performed using the standard protocol during bolus administration of intravenous contrast. RADIATION DOSE REDUCTION: This exam was performed according to the departmental dose-optimization program which includes automated exposure control, adjustment of the mA and/or kV according to patient size and/or use of iterative reconstruction technique. CONTRAST:  75mL OMNIPAQUE IOHEXOL 350 MG/ML SOLN COMPARISON:  CT abdomen and pelvis dated 03/30/2023, CTA chest dated 03/28/2021 FINDINGS: CTA CHEST FINDINGS Cardiovascular: The study is high quality for the evaluation of pulmonary embolism. There are no filling defects in the central, lobar, segmental or subsegmental pulmonary artery branches to suggest acute pulmonary embolism.  Great vessels are normal in course and caliber. Normal heart size. No significant pericardial fluid/thickening. Mediastinum/Nodes: Imaged thyroid gland without nodules meeting criteria for imaging follow-up by size. Normal esophagus. No pathologically enlarged axillary, supraclavicular, mediastinal, or hilar lymph nodes. Lungs/Pleura: The central airways are patent. Scattered subsegmental mucous plugging, most notable in the right upper lobe. Mild diffuse mosaic attenuation. No focal consolidation. No pneumothorax. No pleural effusion. Musculoskeletal: No acute or abnormal lytic or blastic osseous lesions. Multilevel degenerative changes of the thoracic spine. Bandlike density within the left breast at 3 o'clock measures 2.8 x 0.9 cm (5:72), previously 2.3 x 0.7 cm. Review of the MIP images confirms the above findings. CT ABDOMEN and PELVIS FINDINGS Hepatobiliary: No focal hepatic lesions. No intra or extrahepatic biliary ductal dilation. Normal gallbladder. Pancreas: No focal lesions or  main ductal dilation. Spleen: Normal in size without focal abnormality. Adrenals/Urinary Tract: No adrenal nodules. No suspicious renal mass or hydronephrosis. Unchanged punctate nonobstructing left upper pole stone. Bladder is underdistended. Stomach/Bowel: Normal appearance of the stomach. Similar mural thickening of the distal sigmoid colon with mild pericolonic stranding. Colonic diverticulosis. Normal appendix. Vascular/Lymphatic: Aortic atherosclerosis. No enlarged abdominal or pelvic lymph nodes. Reproductive: Again seen is thickening and heterogeneous appearance of the endometrium measuring up to 3.0 cm. There is expanded appearance of the lower uterine segment with infiltrative hypodensity. No adnexal masses. Other: No free fluid, fluid collection, or free air. Musculoskeletal: No acute or abnormal lytic or blastic osseous lesions. Severe degenerative changes of the right hip. IMPRESSION: 1. No evidence of pulmonary  embolism. 2. Mild diffuse mosaic attenuation of the lungs, which is nonspecific but can be seen in the setting of small airways disease. 3. Similar mural thickening of the distal sigmoid colon with mild pericolonic stranding, which may represent sequela of infection/inflammation, however underlying neoplasm can not be excluded. Recommend correlation with colonoscopy. 4. Again seen is abnormal thickening and heterogeneous appearance of the endometrium measuring up to 3.0 cm. Per clinical records, pelvic ultrasound examination is planned. 5. Bandlike density within the left breast at 3 o'clock measures 2.8 x 0.9 cm, previously 2.3 x 0.7 cm. Recommend correlation with targeted breast ultrasound examination. 6.  Aortic Atherosclerosis (ICD10-I70.0). Electronically Signed   By: Agustin Cree M.D.   On: 03/31/2023 09:27   CT Angio Chest PE W and/or Wo Contrast  Result Date: 03/31/2023 CLINICAL DATA:  Three-week history of right flank pain, hematuria, and polyuria. EXAM: CT ANGIOGRAPHY CHEST CT ABDOMEN AND PELVIS WITH CONTRAST TECHNIQUE: Multidetector CT imaging of the chest was performed using the standard protocol during bolus administration of intravenous contrast. Multiplanar CT image reconstructions and MIPs were obtained to evaluate the vascular anatomy. Multidetector CT imaging of the abdomen and pelvis was performed using the standard protocol during bolus administration of intravenous contrast. RADIATION DOSE REDUCTION: This exam was performed according to the departmental dose-optimization program which includes automated exposure control, adjustment of the mA and/or kV according to patient size and/or use of iterative reconstruction technique. CONTRAST:  75mL OMNIPAQUE IOHEXOL 350 MG/ML SOLN COMPARISON:  CT abdomen and pelvis dated 03/30/2023, CTA chest dated 03/28/2021 FINDINGS: CTA CHEST FINDINGS Cardiovascular: The study is high quality for the evaluation of pulmonary embolism. There are no filling defects in  the central, lobar, segmental or subsegmental pulmonary artery branches to suggest acute pulmonary embolism. Great vessels are normal in course and caliber. Normal heart size. No significant pericardial fluid/thickening. Mediastinum/Nodes: Imaged thyroid gland without nodules meeting criteria for imaging follow-up by size. Normal esophagus. No pathologically enlarged axillary, supraclavicular, mediastinal, or hilar lymph nodes. Lungs/Pleura: The central airways are patent. Scattered subsegmental mucous plugging, most notable in the right upper lobe. Mild diffuse mosaic attenuation. No focal consolidation. No pneumothorax. No pleural effusion. Musculoskeletal: No acute or abnormal lytic or blastic osseous lesions. Multilevel degenerative changes of the thoracic spine. Bandlike density within the left breast at 3 o'clock measures 2.8 x 0.9 cm (5:72), previously 2.3 x 0.7 cm. Review of the MIP images confirms the above findings. CT ABDOMEN and PELVIS FINDINGS Hepatobiliary: No focal hepatic lesions. No intra or extrahepatic biliary ductal dilation. Normal gallbladder. Pancreas: No focal lesions or main ductal dilation. Spleen: Normal in size without focal abnormality. Adrenals/Urinary Tract: No adrenal nodules. No suspicious renal mass or hydronephrosis. Unchanged punctate nonobstructing left upper pole stone. Bladder is underdistended.  Stomach/Bowel: Normal appearance of the stomach. Similar mural thickening of the distal sigmoid colon with mild pericolonic stranding. Colonic diverticulosis. Normal appendix. Vascular/Lymphatic: Aortic atherosclerosis. No enlarged abdominal or pelvic lymph nodes. Reproductive: Again seen is thickening and heterogeneous appearance of the endometrium measuring up to 3.0 cm. There is expanded appearance of the lower uterine segment with infiltrative hypodensity. No adnexal masses. Other: No free fluid, fluid collection, or free air. Musculoskeletal: No acute or abnormal lytic or blastic  osseous lesions. Severe degenerative changes of the right hip. IMPRESSION: 1. No evidence of pulmonary embolism. 2. Mild diffuse mosaic attenuation of the lungs, which is nonspecific but can be seen in the setting of small airways disease. 3. Similar mural thickening of the distal sigmoid colon with mild pericolonic stranding, which may represent sequela of infection/inflammation, however underlying neoplasm can not be excluded. Recommend correlation with colonoscopy. 4. Again seen is abnormal thickening and heterogeneous appearance of the endometrium measuring up to 3.0 cm. Per clinical records, pelvic ultrasound examination is planned. 5. Bandlike density within the left breast at 3 o'clock measures 2.8 x 0.9 cm, previously 2.3 x 0.7 cm. Recommend correlation with targeted breast ultrasound examination. 6.  Aortic Atherosclerosis (ICD10-I70.0). Electronically Signed   By: Agustin Cree M.D.   On: 03/31/2023 09:27   CT RENAL STONE STUDY  Result Date: 03/30/2023 CLINICAL DATA:  Abdominal/flank pain EXAM: CT ABDOMEN AND PELVIS WITHOUT CONTRAST TECHNIQUE: Multidetector CT imaging of the abdomen and pelvis was performed following the standard protocol without IV contrast. RADIATION DOSE REDUCTION: This exam was performed according to the departmental dose-optimization program which includes automated exposure control, adjustment of the mA and/or kV according to patient size and/or use of iterative reconstruction technique. COMPARISON:  02/04/2014 FINDINGS: Lower chest: No acute abnormality. Hepatobiliary: No focal liver abnormality is seen. No gallstones, gallbladder wall thickening, or biliary dilatation. Pancreas: Unremarkable. No pancreatic ductal dilatation or surrounding inflammatory changes. Spleen: Normal in size without focal abnormality. Adrenals/Urinary Tract: Normal adrenal glands. No hydronephrosis identified bilaterally. Punctate stone within the upper pole of the left kidney noted, image 76/4. No  hydroureter or ureteral calculi. The urinary bladder appears within normal limits. Stomach/Bowel: Stomach is normal. The appendix is visualized and is within normal limits. Nonspecific asymmetric wall thickening involving the distal sigmoid colon is identified, image 68/2. Difficult to characterize within partially decompressed and unprepped colon. No signs of bowel wall inflammation. Vascular/Lymphatic: Aortic atherosclerosis. No signs of abdominopelvic adenopathy. Reproductive: There is diffuse thickening of the endometrium which measures approximately 2.5 cm, image 70/5. This is considered an abnormal finding, particularly in a postmenopausal female. No adnexal mass. Other: No free fluid or fluid collections. Musculoskeletal: No acute or significant osseous findings. Severe right hip osteoarthritis. IMPRESSION: 1. No acute findings within the abdomen or pelvis. 2. Punctate nonobstructing left renal calculus. 3. Diffuse thickening of the endometrium which measures approximately 2.5 cm. This is considered an abnormal finding, particularly in a postmenopausal female. Recommend further evaluation with pelvic ultrasound. 4. Nonspecific asymmetric wall thickening involving the distal sigmoid colon. Difficult to characterize within partially decompressed and unprepped colon. Correlation with colon cancer screening is recommended. 5.  Aortic Atherosclerosis (ICD10-I70.0). Electronically Signed   By: Signa Kell M.D.   On: 03/30/2023 14:47    Pending Labs Unresulted Labs (From admission, onward)     Start     Ordered   04/01/23 0500  Basic metabolic panel  Tomorrow morning,   R        03/31/23 1301  04/01/23 0500  CBC  Tomorrow morning,   R        03/31/23 1301   03/31/23 1240  HIV Antibody (routine testing w rflx)  (HIV Antibody (Routine testing w reflex) panel)  Once,   R        03/31/23 1301   03/31/23 0640  Urinalysis, Routine w reflex microscopic -Urine, Clean Catch  Once,   URGENT       Question:   Specimen Source  Answer:  Urine, Clean Catch   03/31/23 0640            Vitals/Pain Today's Vitals   03/31/23 0757 03/31/23 0900 03/31/23 1051 03/31/23 1100  BP:  (!) 164/79  (!) 148/78  Pulse:  80  83  Resp:  19  20  Temp:   97.7 F (36.5 C)   TempSrc:   Oral   SpO2:  100%  99%  Weight:      Height:      PainSc: 10-Worst pain ever       Isolation Precautions No active isolations  Medications Medications  ondansetron (ZOFRAN) injection 4 mg (0 mg Intravenous Hold 03/31/23 0856)  acetaminophen (TYLENOL) tablet 650 mg (has no administration in time range)    Or  acetaminophen (TYLENOL) suppository 650 mg (has no administration in time range)  albuterol (PROVENTIL) (2.5 MG/3ML) 0.083% nebulizer solution 2.5 mg (has no administration in time range)  hydrALAZINE (APRESOLINE) injection 10 mg (has no administration in time range)  0.9 %  sodium chloride infusion (has no administration in time range)  oxyCODONE (Oxy IR/ROXICODONE) immediate release tablet 5 mg (has no administration in time range)  morphine (PF) 2 MG/ML injection 1 mg (has no administration in time range)  carvedilol (COREG) tablet 3.125 mg (has no administration in time range)  amLODipine (NORVASC) tablet 5 mg (has no administration in time range)  fentaNYL (SUBLIMAZE) injection 50 mcg (50 mcg Intravenous Given 03/31/23 0752)  iohexol (OMNIPAQUE) 350 MG/ML injection 75 mL (75 mLs Intravenous Contrast Given 03/31/23 0841)  morphine (PF) 2 MG/ML injection 2 mg (2 mg Intravenous Given 03/31/23 1059)    Mobility walks     Focused Assessments GU   R Recommendations: See Admitting Provider Note  Report given to:   Additional Notes:

## 2023-03-31 NOTE — H&P (Signed)
[facility] History and Physical  Kathleen [name] KZS:[id number] DOB: 09/03/1965 DOA: 03/31/2023 PCP: [name], NP  Admitted from: Home Chief Complaint: Abdominal pain  History of Present Illness: Kathleen [name] is a 57 y.o. female with PMH significant for rheumatoid arthritis, HTN, nephrolithiasis. Patient reports that 2 weeks ago she started having right flank pain.  She was seen by her PCP and obtained a renal ultrasound which showed a possible nonobstructing 1.2 cm stone in the right kidney.  She was started on pain medicines and subsequently seen by urologist yesterday 9/10. She underwent CT renal stone study which did not show any acute intra-abdominal pathology.  It showed punctate nonobstructing left renal calculus.  Urinalysis was unremarkable.  No further urology workup was recommended. She continued to have severe right flank and right lower back pain and missed her work for many days.  With persistent symptoms, she presented to the ED today.  In the ED, patient was afebrile, heart rate in 90s, blood pressure 171/128 and later improved to 148/78 Labs with unremarkable CBC, CMP with calcium level elevated to 11, otherwise unremarkable CT chest abdomen pelvis was obtained with findings as below 1. No evidence of pulmonary embolism. 2. Mild diffuse mosaic attenuation of the lungs, which is nonspecific but can be seen in the setting of small airways disease. 3. Similar mural thickening of the distal sigmoid colon with mild pericolonic stranding, which may represent sequela of infection/inflammation, however underlying neoplasm can not be excluded.  4. Abnormal thickening and heterogeneous appearance of the endometrium measuring up to 3.0 cm.  5. Bandlike density within the left breast at 3 o'clock measures 2.8 x 0.9 cm, previously 2.3 x 0.7 cm. Recommend correlation with targeted breast ultrasound examination.  Ultrasound pelvis was obtained which showed thickened and  heterogeneous endometrium. Possibilities include an aggressive or neoplastic process and recommend further evaluation. ED physician discussed the case with GYN on-call who recommended endometrial biopsy inpatient versus outpatient Patient continued to have intractable pain despite IV pain meds in the ED Hospitalist service was consulted for observation.  At the time of my evaluation, patient was propped up in bed.  Pain was partially controlled.  Family at bedside. Patient has significant tenderness on the right flank and right inferior lateral chest wall on palpation.  Review of Systems:  All systems were reviewed and were negative unless otherwise mentioned in the HPI   Past medical history: Past Medical History:  Diagnosis Date   Hypertension    Kidney stone    Rheumatoid arthritis (HCC)     Past surgical history: Past Surgical History:  Procedure Laterality Date   CESAREAN SECTION     3 cesarean sections    Social History:  reports that she has been smoking cigarettes. She has never used smokeless tobacco. She reports that she does not drink alcohol and does not use drugs.  Allergies:  Allergies  Allergen Reactions   Bee Venom Anaphylaxis and Swelling   Penicillins    Bee venom and Penicillins   Family history:  Family History  Problem Relation Age of Onset   Cancer Mother        Female   Hypertension Mother      Physical Exam: Vitals:   03/31/23 1100 03/31/23 1308 03/31/23 1402 03/31/23 1602  BP: (!) 148/78 (!) 172/94 (!) 145/69 (!) 174/99  Pulse: 83 88 84 92  Resp: 20 17 18 18   Temp:  98.8 F (37.1 C) 99.5 F (37.5 C) 99.5 F (37.5  C)  TempSrc:   Oral Oral  SpO2: 99% 92% 91% 94%  Weight:      Height:       Wt Readings from Last 3 Encounters:  03/31/23 94.2 kg  03/30/23 94.2 kg  03/11/23 95.7 kg   Body mass index is 35.63 kg/m.  General exam: Middle-aged African-American female.  In distress from pain Skin: No rashes, lesions or  ulcers. HEENT: Atraumatic, normocephalic, no obvious bleeding Lungs: Clear to auscultation bilaterally CVS: Regular rate and rhythm, no murmur GI/Abd soft, nontender anteriorly.  Significant tenderness of the right flank, right CVA area and lower back CNS: Alert, awake, oriented x 3 Psychiatry: Frustrated because of pain for weeks Extremities: No pedal edema, no calf tenderness   ------------------------------------------------------------------------------------------------------ Assessment/Plan: Principal Problem:   Intractable pain Active Problems:   Obstructive chronic bronchitis without exacerbation   PAD (peripheral artery disease) (HCC)   Type 2 diabetes mellitus with diabetic peripheral angiopathy without gangrene, without long-term current use of insulin (HCC)  Intractable right flank pain Has 2-3 weeks of progressive right flank, right lower back pain limiting her activities of daily living and work today.  On exam today, she has significant tenderness in that area. CT renal study 9/10 and CT abdomen pelvis today did not show any evidence of renal stone or any other acute intra-abdominal pathology. I will obtain MRI thoracic and lumbar spine for completion of study. Currently ordered for as needed oral and IV pain meds  Endometrial thickening CT pelvis and ultrasound pelvis showed thickened and heterogenous endometrium measuring up to 3 cm.  Need to rule out malignancy.  GYN oncology has been consulted and tentatively plans for endometrial biopsy as an outpatient  Sigmoid colon thickening CT abdomen showed mural thickening of the distal sigmoid colon with mild pericolonic stranding, which may represent sequela of infection/inflammation, however underlying neoplasm can not be excluded.  No GI symptoms.  May need a referral to GI at discharge for potential colonoscopy as an outpatient.  Left breast mass CT chest showed a bandlike density within the left breast at 3 o'clock  measures 2.8 x 0.9 cm, previously 2.3 x 0.7 cm.  Radiologist recommended recommend correlation with targeted breast ultrasound examination.   I ordered for breast ultrasound but ultrasound called and said that it can only be done at breast Center as an outpatient through and order by her PCP.  Hypertension PTA meds-med list not updated yet.  Currently shows carvedilol 25 mg twice daily, olmesartan 40 mg daily, amlodipine 5 mg daily, HCTZ 12.5 mL daily Resume Coreg, amlodipine.  Start hydralazine as needed  Aortic atherosclerosis Continue aspirin and statin.  Mobility: Encourage ambulation  Goals of care   Code Status: Full Code    DVT prophylaxis:  SCDs Start: 03/31/23 1241   Antimicrobials: None Fluid: None Consultants: None Family Communication: Family at bedside  Dispo: The patient is from: Home              Anticipated d/c is to: Home  Diet: Diet Order             Diet NPO time specified  Diet effective midnight           Diet Heart Room service appropriate? Yes; Fluid consistency: Thin  Diet effective now                    ------------------------------------------------------------------------------------- Severity of Illness: The appropriate patient status for this patient is OBSERVATION. Observation status is judged to be reasonable  and necessary in order to provide the required intensity of service to ensure the patient's safety. The patient's presenting symptoms, physical exam findings, and initial radiographic and laboratory data in the context of their medical condition is felt to place them at decreased risk for further clinical deterioration. Furthermore, it is anticipated that the patient will be medically stable for discharge from the hospital within 2 midnights of admission.  -------------------------------------------------------------------------------------  Home Meds: Prior to Admission medications   Medication Sig Start Date End Date Taking?  Authorizing Provider  aspirin EC 81 MG tablet Take 1 tablet (81 mg total) by mouth daily. Swallow whole. 03/28/21   Pricilla Loveless, MD  carvedilol (COREG) 25 MG tablet Take 1 tablet (25 mg total) by mouth 2 (two) times daily. 09/02/22   [name], NP  cholecalciferol (VITAMIN D3) 25 MCG (1000 UNIT) tablet Take 1,000 Units by mouth daily.    [provider]  clotrimazole-betamethasone (LOTRISONE) cream Apply 1 Application topically 2 (two) times daily. 09/02/22   [name], NP  Collagen-Vitamin C 1000-10 MG TABS Take 1 tablet by mouth daily.    [provider]  Cyanocobalamin (VITAMIN B12 PO) Take 1 tablet by mouth daily.    [provider]  meloxicam (MOBIC) 7.5 MG tablet Take 1 tablet (7.5 mg total) by mouth daily. 03/03/23   [name], NP  Menthol, Topical Analgesic, (BIOFREEZE ROLL-ON EX) Apply topically as needed.    [provider]  Misc Natural Products (OSTEO BI-FLEX/5-LOXIN ADVANCED PO) Take 2 tablets by mouth daily.    [provider]  Multiple Vitamins-Minerals (ONE-A-DAY ENERGY PO) Take 1 tablet by mouth daily.    [provider]  Olmesartan-amLODIPine-HCTZ 40-5-12.5 MG TABS Take 1 tablet by mouth daily. 11/19/22   Iran Ouch, MD  Omega-3 Fatty Acids (FISH OIL) 1000 MG CAPS Take 1 capsule by mouth at bedtime.    [provider]  ondansetron (ZOFRAN-ODT) 4 MG disintegrating tablet Take 1 tablet (4 mg total) by mouth every 8 (eight) hours as needed for nausea or vomiting. 07/20/22   LampteyBritta Mccreedy, MD  oxyCODONE-acetaminophen (PERCOCET/ROXICET) 5-325 MG tablet Take 1 tablet by mouth every 6 (six) hours as needed for severe pain. 03/23/23   [name], NP  phenazopyridine (PYRIDIUM) 200 MG tablet Take 1 tablet (200 mg total) by mouth 3 (three) times daily as needed for pain. 03/11/23   [name], NP  rosuvastatin (CRESTOR) 5 MG tablet Take 1 tablet (5 mg total) by mouth every other day.  09/02/22   [name], NP  tiZANidine (ZANAFLEX) 4 MG tablet Take 1 tablet (4 mg total) by mouth at bedtime as needed for muscle spasms. 03/03/23   [name], NP    Labs on Admission:   CBC: Recent Labs  Lab 03/31/23 0643  WBC 7.8  HGB 14.1  HCT 43.0  MCV 93.3  PLT 351    Basic Metabolic Panel: Recent Labs  Lab 03/31/23 0643  NA 138  K 4.0  CL 103  CO2 24  GLUCOSE 123*  BUN 18  CREATININE 1.01*  CALCIUM 11.0*    Liver Function Tests: Recent Labs  Lab 03/31/23 0643  AST 19  ALT 18  ALKPHOS 68  BILITOT 0.3  PROT 7.8  ALBUMIN 3.9   No results for input(s): "LIPASE", "AMYLASE" in the last 168 hours. No results for input(s): "AMMONIA" in the last 168 hours.  Cardiac Enzymes: No results for input(s): "CKTOTAL", "CKMB", "CKMBINDEX", "TROPONINI" in the last 168 hours.  BNP (last 3 results) No results for input(s): "BNP" in the last 8760 hours.  ProBNP (last 3 results) No results for input(s): "PROBNP" in the last 8760 hours.  CBG: No results for input(s): "GLUCAP" in the last 168 hours.  Lipase     Component Value Date/Time   LIPASE 26 02/04/2014 1150     Urinalysis    Component Value Date/Time   COLORURINE YELLOW 03/30/2023 1010   APPEARANCEUR CLEAR 03/30/2023 1010   APPEARANCEUR Clear 08/29/2021 0900   LABSPEC >1.030 (H) 03/30/2023 1010   PHURINE 5.5 03/30/2023 1010   GLUCOSEU NEGATIVE 03/30/2023 1010   HGBUR TRACE (A) 03/30/2023 1010   BILIRUBINUR NEGATIVE 03/30/2023 1010   BILIRUBINUR negative 03/11/2023 1512   BILIRUBINUR Negative 08/29/2021 0900   KETONESUR TRACE (A) 03/30/2023 1010   PROTEINUR 30 (A) 03/30/2023 1010   UROBILINOGEN 0.2 03/11/2023 1512   UROBILINOGEN 2.0 (H) 02/04/2014 1158   NITRITE NEGATIVE 03/30/2023 1010   LEUKOCYTESUR SMALL (A) 03/30/2023 1010     Drugs of Abuse  No results found for: "LABOPIA", "COCAINSCRNUR", "LABBENZ", "AMPHETMU", "THCU", "LABBARB"    Radiological Exams on Admission: US PELVIS  TRANSVAGINAL NON-OB (TV ONLY)  Result Date: 03/31/2023 CLINICAL DATA:  Pelvic pain.  Vaginal bleeding. EXAM: ULTRASOUND PELVIS TRANSVAGINAL TECHNIQUE: Transvaginal ultrasound examination of the pelvis was performed including evaluation of the uterus, ovaries, adnexal regions, and pelvic cul-de-sac. COMPARISON:  CT 03/31/2023.  Ultrasound September 2015 FINDINGS: Uterus Measurements: 11.2 x 5.1 x 6.7 cm = volume: 202 mL. Slightly heterogeneous myometrium. Evaluation limited by bowel gas and soft tissue. Endometrium Thickness: 29 mm. Abnormally thickened with some cystic areas. No clear abnormal flow on color Doppler. Ovaries Neither ovary is seen on this examination. This may related to overlapping bowel gas and soft tissue Other findings:  No abnormal free fluid IMPRESSION: Thickened and heterogeneous endometrium. Possibilities include an aggressive or neoplastic process and recommend further evaluation. Poor visualization of the ovaries.  No free fluid in the pelvis. Electronically Signed   By: Karen Kays M.D.   On: 03/31/2023 10:02   CT ABDOMEN PELVIS W CONTRAST  Result Date: 03/31/2023 CLINICAL DATA:  Three-week history of right flank pain, hematuria, and polyuria. EXAM: CT ANGIOGRAPHY CHEST CT ABDOMEN AND PELVIS WITH CONTRAST TECHNIQUE: Multidetector CT imaging of the chest was performed using the standard protocol during bolus administration of intravenous contrast. Multiplanar CT image reconstructions and MIPs were obtained to evaluate the vascular anatomy. Multidetector CT imaging of the abdomen and pelvis was performed using the standard protocol during bolus administration of intravenous contrast. RADIATION DOSE REDUCTION: This exam was performed according to the departmental dose-optimization program which includes automated exposure control, adjustment of the mA and/or kV according to patient size and/or use of iterative reconstruction technique. CONTRAST:  75mL OMNIPAQUE IOHEXOL 350 MG/ML SOLN  COMPARISON:  CT abdomen and pelvis dated 03/30/2023, CTA chest dated 03/28/2021 FINDINGS: CTA CHEST FINDINGS Cardiovascular: The study is high quality for the evaluation of pulmonary embolism. There are no filling defects in the central, lobar, segmental or subsegmental pulmonary artery branches to suggest acute pulmonary embolism. Great vessels are normal in course and caliber. Normal heart size. No significant pericardial fluid/thickening. Mediastinum/Nodes: Imaged thyroid gland without nodules meeting criteria for imaging follow-up by size. Normal esophagus. No pathologically enlarged axillary, supraclavicular, mediastinal, or hilar lymph nodes. Lungs/Pleura: The central airways are patent. Scattered subsegmental mucous plugging, most notable in the right upper lobe. Mild diffuse mosaic attenuation. No focal consolidation. No pneumothorax. No pleural effusion. Musculoskeletal: No  acute or abnormal lytic or blastic osseous lesions. Multilevel degenerative changes of the thoracic spine. Bandlike density within the left breast at 3 o'clock measures 2.8 x 0.9 cm (5:72), previously 2.3 x 0.7 cm. Review of the MIP images confirms the above findings. CT ABDOMEN and PELVIS FINDINGS Hepatobiliary: No focal hepatic lesions. No intra or extrahepatic biliary ductal dilation. Normal gallbladder. Pancreas: No focal lesions or main ductal dilation. Spleen: Normal in size without focal abnormality. Adrenals/Urinary Tract: No adrenal nodules. No suspicious renal mass or hydronephrosis. Unchanged punctate nonobstructing left upper pole stone. Bladder is underdistended. Stomach/Bowel: Normal appearance of the stomach. Similar mural thickening of the distal sigmoid colon with mild pericolonic stranding. Colonic diverticulosis. Normal appendix. Vascular/Lymphatic: Aortic atherosclerosis. No enlarged abdominal or pelvic lymph nodes. Reproductive: Again seen is thickening and heterogeneous appearance of the endometrium measuring up to  3.0 cm. There is expanded appearance of the lower uterine segment with infiltrative hypodensity. No adnexal masses. Other: No free fluid, fluid collection, or free air. Musculoskeletal: No acute or abnormal lytic or blastic osseous lesions. Severe degenerative changes of the right hip. IMPRESSION: 1. No evidence of pulmonary embolism. 2. Mild diffuse mosaic attenuation of the lungs, which is nonspecific but can be seen in the setting of small airways disease. 3. Similar mural thickening of the distal sigmoid colon with mild pericolonic stranding, which may represent sequela of infection/inflammation, however underlying neoplasm can not be excluded. Recommend correlation with colonoscopy. 4. Again seen is abnormal thickening and heterogeneous appearance of the endometrium measuring up to 3.0 cm. Per clinical records, pelvic ultrasound examination is planned. 5. Bandlike density within the left breast at 3 o'clock measures 2.8 x 0.9 cm, previously 2.3 x 0.7 cm. Recommend correlation with targeted breast ultrasound examination. 6.  Aortic Atherosclerosis (ICD10-I70.0). Electronically Signed   By: Agustin Cree M.D.   On: 03/31/2023 09:27   CT Angio Chest PE W and/or Wo Contrast  Result Date: 03/31/2023 CLINICAL DATA:  Three-week history of right flank pain, hematuria, and polyuria. EXAM: CT ANGIOGRAPHY CHEST CT ABDOMEN AND PELVIS WITH CONTRAST TECHNIQUE: Multidetector CT imaging of the chest was performed using the standard protocol during bolus administration of intravenous contrast. Multiplanar CT image reconstructions and MIPs were obtained to evaluate the vascular anatomy. Multidetector CT imaging of the abdomen and pelvis was performed using the standard protocol during bolus administration of intravenous contrast. RADIATION DOSE REDUCTION: This exam was performed according to the departmental dose-optimization program which includes automated exposure control, adjustment of the mA and/or kV according to patient  size and/or use of iterative reconstruction technique. CONTRAST:  75mL OMNIPAQUE IOHEXOL 350 MG/ML SOLN COMPARISON:  CT abdomen and pelvis dated 03/30/2023, CTA chest dated 03/28/2021 FINDINGS: CTA CHEST FINDINGS Cardiovascular: The study is high quality for the evaluation of pulmonary embolism. There are no filling defects in the central, lobar, segmental or subsegmental pulmonary artery branches to suggest acute pulmonary embolism. Great vessels are normal in course and caliber. Normal heart size. No significant pericardial fluid/thickening. Mediastinum/Nodes: Imaged thyroid gland without nodules meeting criteria for imaging follow-up by size. Normal esophagus. No pathologically enlarged axillary, supraclavicular, mediastinal, or hilar lymph nodes. Lungs/Pleura: The central airways are patent. Scattered subsegmental mucous plugging, most notable in the right upper lobe. Mild diffuse mosaic attenuation. No focal consolidation. No pneumothorax. No pleural effusion. Musculoskeletal: No acute or abnormal lytic or blastic osseous lesions. Multilevel degenerative changes of the thoracic spine. Bandlike density within the left breast at 3 o'clock measures 2.8 x 0.9 cm (5:72), previously  2.3 x 0.7 cm. Review of the MIP images confirms the above findings. CT ABDOMEN and PELVIS FINDINGS Hepatobiliary: No focal hepatic lesions. No intra or extrahepatic biliary ductal dilation. Normal gallbladder. Pancreas: No focal lesions or main ductal dilation. Spleen: Normal in size without focal abnormality. Adrenals/Urinary Tract: No adrenal nodules. No suspicious renal mass or hydronephrosis. Unchanged punctate nonobstructing left upper pole stone. Bladder is underdistended. Stomach/Bowel: Normal appearance of the stomach. Similar mural thickening of the distal sigmoid colon with mild pericolonic stranding. Colonic diverticulosis. Normal appendix. Vascular/Lymphatic: Aortic atherosclerosis. No enlarged abdominal or pelvic lymph nodes.  Reproductive: Again seen is thickening and heterogeneous appearance of the endometrium measuring up to 3.0 cm. There is expanded appearance of the lower uterine segment with infiltrative hypodensity. No adnexal masses. Other: No free fluid, fluid collection, or free air. Musculoskeletal: No acute or abnormal lytic or blastic osseous lesions. Severe degenerative changes of the right hip. IMPRESSION: 1. No evidence of pulmonary embolism. 2. Mild diffuse mosaic attenuation of the lungs, which is nonspecific but can be seen in the setting of small airways disease. 3. Similar mural thickening of the distal sigmoid colon with mild pericolonic stranding, which may represent sequela of infection/inflammation, however underlying neoplasm can not be excluded. Recommend correlation with colonoscopy. 4. Again seen is abnormal thickening and heterogeneous appearance of the endometrium measuring up to 3.0 cm. Per clinical records, pelvic ultrasound examination is planned. 5. Bandlike density within the left breast at 3 o'clock measures 2.8 x 0.9 cm, previously 2.3 x 0.7 cm. Recommend correlation with targeted breast ultrasound examination. 6.  Aortic Atherosclerosis (ICD10-I70.0). Electronically Signed   By: Agustin Cree M.D.   On: 03/31/2023 09:27   CT RENAL STONE STUDY  Result Date: 03/30/2023 CLINICAL DATA:  Abdominal/flank pain EXAM: CT ABDOMEN AND PELVIS WITHOUT CONTRAST TECHNIQUE: Multidetector CT imaging of the abdomen and pelvis was performed following the standard protocol without IV contrast. RADIATION DOSE REDUCTION: This exam was performed according to the departmental dose-optimization program which includes automated exposure control, adjustment of the mA and/or kV according to patient size and/or use of iterative reconstruction technique. COMPARISON:  02/04/2014 FINDINGS: Lower chest: No acute abnormality. Hepatobiliary: No focal liver abnormality is seen. No gallstones, gallbladder wall thickening, or biliary  dilatation. Pancreas: Unremarkable. No pancreatic ductal dilatation or surrounding inflammatory changes. Spleen: Normal in size without focal abnormality. Adrenals/Urinary Tract: Normal adrenal glands. No hydronephrosis identified bilaterally. Punctate stone within the upper pole of the left kidney noted, image 76/4. No hydroureter or ureteral calculi. The urinary bladder appears within normal limits. Stomach/Bowel: Stomach is normal. The appendix is visualized and is within normal limits. Nonspecific asymmetric wall thickening involving the distal sigmoid colon is identified, image 68/2. Difficult to characterize within partially decompressed and unprepped colon. No signs of bowel wall inflammation. Vascular/Lymphatic: Aortic atherosclerosis. No signs of abdominopelvic adenopathy. Reproductive: There is diffuse thickening of the endometrium which measures approximately 2.5 cm, image 70/5. This is considered an abnormal finding, particularly in a postmenopausal female. No adnexal mass. Other: No free fluid or fluid collections. Musculoskeletal: No acute or significant osseous findings. Severe right hip osteoarthritis. IMPRESSION: 1. No acute findings within the abdomen or pelvis. 2. Punctate nonobstructing left renal calculus. 3. Diffuse thickening of the endometrium which measures approximately 2.5 cm. This is considered an abnormal finding, particularly in a postmenopausal female. Recommend further evaluation with pelvic ultrasound. 4. Nonspecific asymmetric wall thickening involving the distal sigmoid colon. Difficult to characterize within partially decompressed and unprepped colon. Correlation with colon  cancer screening is recommended. 5.  Aortic Atherosclerosis (ICD10-I70.0). Electronically Signed   By: Signa Kell M.D.   On: 03/30/2023 14:47     Signed, Lorin Glass, MD [facility] 03/31/2023

## 2023-03-31 NOTE — ED Triage Notes (Signed)
Pt arrives to ED c/o right sided flank pain x 3 weeks. Pt endorses previous dx of kidney stone. Pt reports blood in urine and polyuria.

## 2023-03-31 NOTE — Consult Note (Signed)
Reason for Consult:low back and flank pain and vaginal spotting Referring Physician: Dahal  Kathleen [name] is an 57 y.o. female. [id number] Postmenopausal for 7 years. She notice vaginal spotting for 3 days.  She has worsening right flank pain for 2 weeks and currently imaging shows no renal stone but US shows endometrial thickening Pertinent Gynecological History:  Bleeding: recent spotting Contraception: post menopausal status Blood transfusions: none Sexually transmitted diseases: PID 2015 Last pap: normal    Menstrual History:  Patient's last menstrual period was 01/02/2016.    Past Medical History:  Diagnosis Date   Hypertension    Kidney stone    Rheumatoid arthritis (HCC)     Past Surgical History:  Procedure Laterality Date   CESAREAN SECTION     3 cesarean sections    Family History  Problem Relation Age of Onset   Cancer Mother        Female   Hypertension Mother     Social History:  reports that she has been smoking cigarettes. She has never used smokeless tobacco. She reports that she does not drink alcohol and does not use drugs.  Allergies:  Allergies  Allergen Reactions   Bee Venom Anaphylaxis and Swelling   Penicillins     Medications: I have reviewed the patient's current medications.  Review of Systems  Constitutional:  Negative for fever.  Respiratory: Negative.    Cardiovascular: Negative.   Gastrointestinal: Negative.   Genitourinary:  Positive for flank pain and vaginal bleeding (spotting).  Musculoskeletal:  Positive for back pain (right side low back).    Blood pressure (!) 145/69, pulse 84, temperature 99.5 F (37.5 C), temperature source Oral, resp. rate 18, height 5\' 4"  (1.626 m), weight 94.2 kg, last menstrual period 01/02/2016, SpO2 91%. Physical Exam Constitutional:      Appearance: Ill appearance: uncomfortable.  HENT:     Head: Normocephalic and atraumatic.  Cardiovascular:     Rate and Rhythm: Normal rate.  Pulmonary:      Effort: Pulmonary effort is normal.  Abdominal:     General: Abdomen is flat.     Palpations: Abdomen is soft.  Musculoskeletal:     Cervical back: Normal range of motion.  Skin:    General: Skin is warm and dry.  Neurological:     Mental Status: She is alert.  Psychiatric:        Mood and Affect: Mood normal.        Behavior: Behavior normal.    Results for orders placed or performed during the hospital encounter of 03/31/23 (from the past 48 hour(s))  Basic metabolic panel     Status: Abnormal   Collection Time: 03/31/23  6:43 AM  Result Value Ref Range   Sodium 138 135 - 145 mmol/L   Potassium 4.0 3.5 - 5.1 mmol/L   Chloride 103 98 - 111 mmol/L   CO2 24 22 - 32 mmol/L   Glucose, Bld 123 (H) 70 - 99 mg/dL    Comment: Glucose reference range applies only to samples taken after fasting for at least 8 hours.   BUN 18 6 - 20 mg/dL   Creatinine, Ser 9.62 (H) 0.44 - 1.00 mg/dL   Calcium 95.2 (H) 8.9 - 10.3 mg/dL   GFR, Estimated >84 >13 mL/min    Comment: (NOTE) Calculated using the CKD-EPI Creatinine Equation (2021)    Anion gap 11 5 - 15    Comment: Performed at [facility], [address] [place], [place] [address]  CBC     Status: None   Collection Time: 03/31/23  6:43 AM  Result Value Ref Range   WBC 7.8 4.0 - 10.5 K/uL   RBC 4.61 3.87 - 5.11 MIL/uL   Hemoglobin 14.1 12.0 - 15.0 g/dL   HCT 27.2 53.6 - 64.4 %   MCV 93.3 80.0 - 100.0 fL   MCH 30.6 26.0 - 34.0 pg   MCHC 32.8 30.0 - 36.0 g/dL   RDW 03.4 74.2 - 59.5 %   Platelets 351 150 - 400 K/uL   nRBC 0.0 0.0 - 0.2 %    Comment: Performed at [facility], [address] [place], [place] [address]  Hepatic function panel     Status: None   Collection Time: 03/31/23  6:43 AM  Result Value Ref Range   Total Protein 7.8 6.5 - 8.1 g/dL   Albumin 3.9 3.5 - 5.0 g/dL   AST 19 15 - 41 U/L   ALT 18 0 - 44 U/L   Alkaline Phosphatase 68 38 - 126 U/L   Total Bilirubin 0.3 0.3 - 1.2 mg/dL   Bilirubin,  Direct <6.4 0.0 - 0.2 mg/dL   Indirect Bilirubin NOT CALCULATED 0.3 - 0.9 mg/dL    Comment: Performed at [facility], [address], [place], [place] [address]    US PELVIS TRANSVAGINAL NON-OB (TV ONLY)  Result Date: 03/31/2023 CLINICAL DATA:  Pelvic pain.  Vaginal bleeding. EXAM: ULTRASOUND PELVIS TRANSVAGINAL TECHNIQUE: Transvaginal ultrasound examination of the pelvis was performed including evaluation of the uterus, ovaries, adnexal regions, and pelvic cul-de-sac. COMPARISON:  CT 03/31/2023.  Ultrasound September 2015 FINDINGS: Uterus Measurements: 11.2 x 5.1 x 6.7 cm = volume: 202 mL. Slightly heterogeneous myometrium. Evaluation limited by bowel gas and soft tissue. Endometrium Thickness: 29 mm. Abnormally thickened with some cystic areas. No clear abnormal flow on color Doppler. Ovaries Neither ovary is seen on this examination. This may related to overlapping bowel gas and soft tissue Other findings:  No abnormal free fluid IMPRESSION: Thickened and heterogeneous endometrium. Possibilities include an aggressive or neoplastic process and recommend further evaluation. Poor visualization of the ovaries.  No free fluid in the pelvis. Electronically Signed   By: Karen Kays M.D.   On: 03/31/2023 10:02   CT ABDOMEN PELVIS W CONTRAST  Result Date: 03/31/2023 CLINICAL DATA:  Three-week history of right flank pain, hematuria, and polyuria. EXAM: CT ANGIOGRAPHY CHEST CT ABDOMEN AND PELVIS WITH CONTRAST TECHNIQUE: Multidetector CT imaging of the chest was performed using the standard protocol during bolus administration of intravenous contrast. Multiplanar CT image reconstructions and MIPs were obtained to evaluate the vascular anatomy. Multidetector CT imaging of the abdomen and pelvis was performed using the standard protocol during bolus administration of intravenous contrast. RADIATION DOSE REDUCTION: This exam was performed according to the departmental dose-optimization program which includes  automated exposure control, adjustment of the mA and/or kV according to patient size and/or use of iterative reconstruction technique. CONTRAST:  75mL OMNIPAQUE IOHEXOL 350 MG/ML SOLN COMPARISON:  CT abdomen and pelvis dated 03/30/2023, CTA chest dated 03/28/2021 FINDINGS: CTA CHEST FINDINGS Cardiovascular: The study is high quality for the evaluation of pulmonary embolism. There are no filling defects in the central, lobar, segmental or subsegmental pulmonary artery branches to suggest acute pulmonary embolism. Great vessels are normal in course and caliber. Normal heart size. No significant pericardial fluid/thickening. Mediastinum/Nodes: Imaged thyroid gland without nodules meeting criteria for imaging follow-up by size. Normal esophagus. No pathologically enlarged axillary, supraclavicular,  mediastinal, or hilar lymph nodes. Lungs/Pleura: The central airways are patent. Scattered subsegmental mucous plugging, most notable in the right upper lobe. Mild diffuse mosaic attenuation. No focal consolidation. No pneumothorax. No pleural effusion. Musculoskeletal: No acute or abnormal lytic or blastic osseous lesions. Multilevel degenerative changes of the thoracic spine. Bandlike density within the left breast at 3 o'clock measures 2.8 x 0.9 cm (5:72), previously 2.3 x 0.7 cm. Review of the MIP images confirms the above findings. CT ABDOMEN and PELVIS FINDINGS Hepatobiliary: No focal hepatic lesions. No intra or extrahepatic biliary ductal dilation. Normal gallbladder. Pancreas: No focal lesions or main ductal dilation. Spleen: Normal in size without focal abnormality. Adrenals/Urinary Tract: No adrenal nodules. No suspicious renal mass or hydronephrosis. Unchanged punctate nonobstructing left upper pole stone. Bladder is underdistended. Stomach/Bowel: Normal appearance of the stomach. Similar mural thickening of the distal sigmoid colon with mild pericolonic stranding. Colonic diverticulosis. Normal appendix.  Vascular/Lymphatic: Aortic atherosclerosis. No enlarged abdominal or pelvic lymph nodes. Reproductive: Again seen is thickening and heterogeneous appearance of the endometrium measuring up to 3.0 cm. There is expanded appearance of the lower uterine segment with infiltrative hypodensity. No adnexal masses. Other: No free fluid, fluid collection, or free air. Musculoskeletal: No acute or abnormal lytic or blastic osseous lesions. Severe degenerative changes of the right hip. IMPRESSION: 1. No evidence of pulmonary embolism. 2. Mild diffuse mosaic attenuation of the lungs, which is nonspecific but can be seen in the setting of small airways disease. 3. Similar mural thickening of the distal sigmoid colon with mild pericolonic stranding, which may represent sequela of infection/inflammation, however underlying neoplasm can not be excluded. Recommend correlation with colonoscopy. 4. Again seen is abnormal thickening and heterogeneous appearance of the endometrium measuring up to 3.0 cm. Per clinical records, pelvic ultrasound examination is planned. 5. Bandlike density within the left breast at 3 o'clock measures 2.8 x 0.9 cm, previously 2.3 x 0.7 cm. Recommend correlation with targeted breast ultrasound examination. 6.  Aortic Atherosclerosis (ICD10-I70.0). Electronically Signed   By: Agustin Cree M.D.   On: 03/31/2023 09:27   CT Angio Chest PE W and/or Wo Contrast  Result Date: 03/31/2023 CLINICAL DATA:  Three-week history of right flank pain, hematuria, and polyuria. EXAM: CT ANGIOGRAPHY CHEST CT ABDOMEN AND PELVIS WITH CONTRAST TECHNIQUE: Multidetector CT imaging of the chest was performed using the standard protocol during bolus administration of intravenous contrast. Multiplanar CT image reconstructions and MIPs were obtained to evaluate the vascular anatomy. Multidetector CT imaging of the abdomen and pelvis was performed using the standard protocol during bolus administration of intravenous contrast. RADIATION  DOSE REDUCTION: This exam was performed according to the departmental dose-optimization program which includes automated exposure control, adjustment of the mA and/or kV according to patient size and/or use of iterative reconstruction technique. CONTRAST:  75mL OMNIPAQUE IOHEXOL 350 MG/ML SOLN COMPARISON:  CT abdomen and pelvis dated 03/30/2023, CTA chest dated 03/28/2021 FINDINGS: CTA CHEST FINDINGS Cardiovascular: The study is high quality for the evaluation of pulmonary embolism. There are no filling defects in the central, lobar, segmental or subsegmental pulmonary artery branches to suggest acute pulmonary embolism. Great vessels are normal in course and caliber. Normal heart size. No significant pericardial fluid/thickening. Mediastinum/Nodes: Imaged thyroid gland without nodules meeting criteria for imaging follow-up by size. Normal esophagus. No pathologically enlarged axillary, supraclavicular, mediastinal, or hilar lymph nodes. Lungs/Pleura: The central airways are patent. Scattered subsegmental mucous plugging, most notable in the right upper lobe. Mild diffuse mosaic attenuation. No focal consolidation. No pneumothorax.  No pleural effusion. Musculoskeletal: No acute or abnormal lytic or blastic osseous lesions. Multilevel degenerative changes of the thoracic spine. Bandlike density within the left breast at 3 o'clock measures 2.8 x 0.9 cm (5:72), previously 2.3 x 0.7 cm. Review of the MIP images confirms the above findings. CT ABDOMEN and PELVIS FINDINGS Hepatobiliary: No focal hepatic lesions. No intra or extrahepatic biliary ductal dilation. Normal gallbladder. Pancreas: No focal lesions or main ductal dilation. Spleen: Normal in size without focal abnormality. Adrenals/Urinary Tract: No adrenal nodules. No suspicious renal mass or hydronephrosis. Unchanged punctate nonobstructing left upper pole stone. Bladder is underdistended. Stomach/Bowel: Normal appearance of the stomach. Similar mural thickening  of the distal sigmoid colon with mild pericolonic stranding. Colonic diverticulosis. Normal appendix. Vascular/Lymphatic: Aortic atherosclerosis. No enlarged abdominal or pelvic lymph nodes. Reproductive: Again seen is thickening and heterogeneous appearance of the endometrium measuring up to 3.0 cm. There is expanded appearance of the lower uterine segment with infiltrative hypodensity. No adnexal masses. Other: No free fluid, fluid collection, or free air. Musculoskeletal: No acute or abnormal lytic or blastic osseous lesions. Severe degenerative changes of the right hip. IMPRESSION: 1. No evidence of pulmonary embolism. 2. Mild diffuse mosaic attenuation of the lungs, which is nonspecific but can be seen in the setting of small airways disease. 3. Similar mural thickening of the distal sigmoid colon with mild pericolonic stranding, which may represent sequela of infection/inflammation, however underlying neoplasm can not be excluded. Recommend correlation with colonoscopy. 4. Again seen is abnormal thickening and heterogeneous appearance of the endometrium measuring up to 3.0 cm. Per clinical records, pelvic ultrasound examination is planned. 5. Bandlike density within the left breast at 3 o'clock measures 2.8 x 0.9 cm, previously 2.3 x 0.7 cm. Recommend correlation with targeted breast ultrasound examination. 6.  Aortic Atherosclerosis (ICD10-I70.0). Electronically Signed   By: Agustin Cree M.D.   On: 03/31/2023 09:27   CT RENAL STONE STUDY  Result Date: 03/30/2023 CLINICAL DATA:  Abdominal/flank pain EXAM: CT ABDOMEN AND PELVIS WITHOUT CONTRAST TECHNIQUE: Multidetector CT imaging of the abdomen and pelvis was performed following the standard protocol without IV contrast. RADIATION DOSE REDUCTION: This exam was performed according to the departmental dose-optimization program which includes automated exposure control, adjustment of the mA and/or kV according to patient size and/or use of iterative  reconstruction technique. COMPARISON:  02/04/2014 FINDINGS: Lower chest: No acute abnormality. Hepatobiliary: No focal liver abnormality is seen. No gallstones, gallbladder wall thickening, or biliary dilatation. Pancreas: Unremarkable. No pancreatic ductal dilatation or surrounding inflammatory changes. Spleen: Normal in size without focal abnormality. Adrenals/Urinary Tract: Normal adrenal glands. No hydronephrosis identified bilaterally. Punctate stone within the upper pole of the left kidney noted, image 76/4. No hydroureter or ureteral calculi. The urinary bladder appears within normal limits. Stomach/Bowel: Stomach is normal. The appendix is visualized and is within normal limits. Nonspecific asymmetric wall thickening involving the distal sigmoid colon is identified, image 68/2. Difficult to characterize within partially decompressed and unprepped colon. No signs of bowel wall inflammation. Vascular/Lymphatic: Aortic atherosclerosis. No signs of abdominopelvic adenopathy. Reproductive: There is diffuse thickening of the endometrium which measures approximately 2.5 cm, image 70/5. This is considered an abnormal finding, particularly in a postmenopausal female. No adnexal mass. Other: No free fluid or fluid collections. Musculoskeletal: No acute or significant osseous findings. Severe right hip osteoarthritis. IMPRESSION: 1. No acute findings within the abdomen or pelvis. 2. Punctate nonobstructing left renal calculus. 3. Diffuse thickening of the endometrium which measures approximately 2.5 cm. This is considered  an abnormal finding, particularly in a postmenopausal female. Recommend further evaluation with pelvic ultrasound. 4. Nonspecific asymmetric wall thickening involving the distal sigmoid colon. Difficult to characterize within partially decompressed and unprepped colon. Correlation with colon cancer screening is recommended. 5.  Aortic Atherosclerosis (ICD10-I70.0). Electronically Signed   By: Signa Kell M.D.   On: 03/30/2023 14:47    Assessment/Plan: Her flank pain is likely unrelated to her vaginal bleeding and endometrial thickening. No evidence of uterine fibroid. She needs OP f/u for possible endometrial biopsy and I will message [facility]. I ordered STD testing and will follow-up  Scheryl Darter 03/31/2023

## 2023-04-01 DIAGNOSIS — R52 Pain, unspecified: Secondary | ICD-10-CM | POA: Diagnosis not present

## 2023-04-01 LAB — CBC
HCT: 40.5 % (ref 36.0–46.0)
Hemoglobin: 13.2 g/dL (ref 12.0–15.0)
MCH: 29.5 pg (ref 26.0–34.0)
MCHC: 32.6 g/dL (ref 30.0–36.0)
MCV: 90.4 fL (ref 80.0–100.0)
Platelets: 302 10*3/uL (ref 150–400)
RBC: 4.48 MIL/uL (ref 3.87–5.11)
RDW: 13 % (ref 11.5–15.5)
WBC: 6.6 10*3/uL (ref 4.0–10.5)
nRBC: 0 % (ref 0.0–0.2)

## 2023-04-01 LAB — BASIC METABOLIC PANEL
Anion gap: 10 (ref 5–15)
BUN: 10 mg/dL (ref 6–20)
CO2: 25 mmol/L (ref 22–32)
Calcium: 10.5 mg/dL — ABNORMAL HIGH (ref 8.9–10.3)
Chloride: 102 mmol/L (ref 98–111)
Creatinine, Ser: 0.97 mg/dL (ref 0.44–1.00)
GFR, Estimated: >60 mL/min (ref >60)
Glucose, Bld: 130 mg/dL — ABNORMAL HIGH (ref 70–99)
Potassium: 3.6 mmol/L (ref 3.5–5.1)
Sodium: 137 mmol/L (ref 135–145)

## 2023-04-01 LAB — HIV ANTIBODY (ROUTINE TESTING W REFLEX): HIV Screen 4th Generation wRfx: NONREACTIVE

## 2023-04-01 MED ORDER — OXYCODONE HCL 5 MG PO TABS
10.0000 mg | ORAL_TABLET | ORAL | Status: DC | PRN
  Administered 2023-04-01 – 2023-04-02 (×4): 10 mg via ORAL
  Filled 2023-04-01 (×4): qty 2

## 2023-04-01 MED ORDER — BACLOFEN 10 MG PO TABS
5.0000 mg | ORAL_TABLET | Freq: Two times a day (BID) | ORAL | Status: DC
  Administered 2023-04-01 – 2023-04-02 (×3): 5 mg via ORAL
  Filled 2023-04-01 (×3): qty 1

## 2023-04-01 MED ORDER — ACETAMINOPHEN 500 MG PO TABS
1000.0000 mg | ORAL_TABLET | Freq: Four times a day (QID) | ORAL | Status: AC
  Administered 2023-04-01 – 2023-04-02 (×4): 1000 mg via ORAL
  Filled 2023-04-01 (×4): qty 2

## 2023-04-01 MED ORDER — LIDOCAINE 5 % EX PTCH
1.0000 | MEDICATED_PATCH | CUTANEOUS | Status: DC
  Administered 2023-04-01 – 2023-04-02 (×2): 1 via TRANSDERMAL
  Filled 2023-04-01 (×2): qty 1

## 2023-04-01 NOTE — Progress Notes (Signed)
PROGRESS NOTE    MATTALYNN BAUMEL  NWG:[id number] DOB: [birth year] DOA: 03/31/2023 PCP: [name], NP    Brief Narrative:  57 year old with history of hypertension, rheumatoid arthritis and nephrolithiasis presenting with about 3 weeks of right-sided flank pain.  Multiple outpatient investigations but persistent pain.  Not relieved by oral pain medications. In the emergency room hemodynamically stable.  Multiple investigations are negative but patient continues to have pain.   Assessment & Plan:   Intractable right flank pain: Abdominal ultrasound essentially normal.  Previous ultrasound reported 1.2 cm right renal stone not present on CT scan. CT scan chest abdomen pelvis without any abnormalities except uterine abnormality as below. MRI of the thoracic and lumbar spine with some osteoarthritic changes. Pain is likely due to muscle spasm, radiating back pain from the spine or recently passed kidney stone.  Patient continues to have intractable pain. Use oral oxycodone, scheduled baclofen, scheduled Tylenol and lidocaine patches. Use IV pain medication as needed to control pain. Once able to control pain, will discharge patient home with pain medications and outpatient follow-up.  Endometrial thickening/postmeal possible spotting: Transvaginal ultrasound showed thickened endometrium.  Patient will need biopsy.  Seen by gynecology and they have a schedule outpatient follow-up.  Abnormal breast on CT scan: Patient will need outpatient mammogram.  She is aware.  Essential hypertension: Stable on current regimen.  Atherosclerotic disease: On aspirin and statin.   DVT prophylaxis: SCDs Start: 03/31/23 1241   Code Status: Full code Family Communication: None at bedside. Disposition Plan: Status is: Observation The patient will require care spanning > 2 midnights and should be moved to inpatient because: Persistent pain.  Requiring IV pain medications.     Consultants:   None  Procedures:  None  Antimicrobials:  None   Subjective: Patient seen and examined in the morning rounds.  She is very worried about ongoing right flank pain.  We discussed all the test results.  Updated her about possible findings.   We discussed about not finding anything that can be surgically removed or can have another intervention to expedient healing.  Objective: Vitals:   03/31/23 1602 03/31/23 2025 04/01/23 0432 04/01/23 0747  BP: (!) 174/99 (!) 171/89 (!) 162/91 (!) 150/90  Pulse: 92 87 93 97  Resp: 18 20 18 18   Temp: 99.5 F (37.5 C) 98.9 F (37.2 C) 99.7 F (37.6 C) 99.7 F (37.6 C)  TempSrc: Oral Oral Oral Oral  SpO2: 94% 100% 94% 99%  Weight:      Height:        Intake/Output Summary (Last 24 hours) at 04/01/2023 1346 Last data filed at 03/31/2023 1439 Gross per 24 hour  Intake 1000 ml  Output --  Net 1000 ml   Filed Weights   03/31/23 0638  Weight: 94.2 kg    Examination:  General exam: Appears comfortable. Respiratory system: Clear to auscultation. Respiratory effort normal. Cardiovascular system: S1 & S2 heard, RRR. Gastrointestinal system: Soft.  Nontender.  She has some hyperesthesia on the right flank.  No rigidity or guarding. Central nervous system: Alert and oriented. No focal neurological deficits. Extremities: Symmetric 5 x 5 power.    Data Reviewed: I have personally reviewed following labs and imaging studies  CBC: Recent Labs  Lab 03/31/23 0643 04/01/23 0830  WBC 7.8 6.6  HGB 14.1 13.2  HCT 43.0 40.5  MCV 93.3 90.4  PLT 351 302   Basic Metabolic Panel: Recent Labs  Lab 03/31/23 0643 04/01/23 0830  NA 138 137  K 4.0 3.6  CL 103 102  CO2 24 25  GLUCOSE 123* 130*  BUN 18 10  CREATININE 1.01* 0.97  CALCIUM 11.0* 10.5*   GFR: Estimated Creatinine Clearance: 71.2 mL/min (by C-G formula based on SCr of 0.97 mg/dL). Liver Function Tests: Recent Labs  Lab 03/31/23 0643  AST 19  ALT 18  ALKPHOS 68  BILITOT  0.3  PROT 7.8  ALBUMIN 3.9   No results for input(s): "LIPASE", "AMYLASE" in the last 168 hours. No results for input(s): "AMMONIA" in the last 168 hours. Coagulation Profile: No results for input(s): "INR", "PROTIME" in the last 168 hours. Cardiac Enzymes: No results for input(s): "CKTOTAL", "CKMB", "CKMBINDEX", "TROPONINI" in the last 168 hours. BNP (last 3 results) No results for input(s): "PROBNP" in the last 8760 hours. HbA1C: No results for input(s): "HGBA1C" in the last 72 hours. CBG: No results for input(s): "GLUCAP" in the last 168 hours. Lipid Profile: No results for input(s): "CHOL", "HDL", "LDLCALC", "TRIG", "CHOLHDL", "LDLDIRECT" in the last 72 hours. Thyroid Function Tests: No results for input(s): "TSH", "T4TOTAL", "FREET4", "T3FREE", "THYROIDAB" in the last 72 hours. Anemia Panel: No results for input(s): "VITAMINB12", "FOLATE", "FERRITIN", "TIBC", "IRON", "RETICCTPCT" in the last 72 hours. Sepsis Labs: No results for input(s): "PROCALCITON", "LATICACIDVEN" in the last 168 hours.  No results found for this or any previous visit (from the past 240 hour(s)).       Radiology Studies: MR Lumbar Spine W Wo Contrast  Result Date: 04/01/2023 CLINICAL DATA:  Mid and low back pain with infection suspected EXAM: MRI THORACIC AND LUMBAR SPINE WITHOUT AND WITH CONTRAST TECHNIQUE: Multiplanar and multiecho pulse sequences of the thoracic and lumbar spine were obtained without and with intravenous contrast. CONTRAST:  9mL GADAVIST GADOBUTROL 1 MMOL/ML IV SOLN COMPARISON:  None Available. FINDINGS: MRI THORACIC SPINE FINDINGS Alignment:  Normal Vertebrae: Opposing endplate edema at J1-B1, favored to be degenerative. Normal signal within the disc space. Cord:  Normal signal and morphology. Paraspinal and other soft tissues: Negative. Disc levels: There is no spinal canal stenosis. MRI LUMBAR SPINE FINDINGS Segmentation:  Standard Alignment:  Grade 1 anterolisthesis at L4-5  Vertebrae:  No fracture, evidence of discitis, or bone lesion. Conus medullaris: Extends to the L2 level and appears normal. Paraspinal and other soft tissues: Negative Disc levels: T12-L1: Small focus of hyperintense T2-weighted signal at the right subarticular recess. No spinal canal stenosis. L1-L2: Normal disc space and facet joints. No spinal canal stenosis. No neural foraminal stenosis. L2-L3: Left asymmetric disc bulge. No spinal canal stenosis. No neural foraminal stenosis. L3-L4: Small disc bulge. No spinal canal stenosis. No neural foraminal stenosis. L4-L5: Facet arthrosis with small amount of fluid in the left facet joint. Small left foraminal annular fissure could irritate the exiting left L4 nerve root. No spinal canal stenosis. No neural foraminal stenosis. L5-S1: Mild facet arthrosis. No spinal canal stenosis. No neural foraminal stenosis. Visualized sacrum: Normal. IMPRESSION: 1. No discitis-osteomyelitis or epidural abscess. 2. Small focus of hyperintense T2-weighted signal at the right subarticular recess at T12-L1. This is favored to be an extruded or sequestered disc fragment. 3. Opposing endplate edema at Y7-W2, favored to be degenerative. 4. Grade 1 anterolisthesis at L4-L5 due to facet arthrosis. 5. No spinal canal or neural foraminal stenosis. Electronically Signed   By: Deatra Robinson M.D.   On: 04/01/2023 01:21   MR THORACIC SPINE W WO CONTRAST  Result Date: 04/01/2023 CLINICAL DATA:  Mid and low back pain with infection suspected EXAM: MRI  THORACIC AND LUMBAR SPINE WITHOUT AND WITH CONTRAST TECHNIQUE: Multiplanar and multiecho pulse sequences of the thoracic and lumbar spine were obtained without and with intravenous contrast. CONTRAST:  9mL GADAVIST GADOBUTROL 1 MMOL/ML IV SOLN COMPARISON:  None Available. FINDINGS: MRI THORACIC SPINE FINDINGS Alignment:  Normal Vertebrae: Opposing endplate edema at U4-Q0, favored to be degenerative. Normal signal within the disc space. Cord:  Normal  signal and morphology. Paraspinal and other soft tissues: Negative. Disc levels: There is no spinal canal stenosis. MRI LUMBAR SPINE FINDINGS Segmentation:  Standard Alignment:  Grade 1 anterolisthesis at L4-5 Vertebrae:  No fracture, evidence of discitis, or bone lesion. Conus medullaris: Extends to the L2 level and appears normal. Paraspinal and other soft tissues: Negative Disc levels: T12-L1: Small focus of hyperintense T2-weighted signal at the right subarticular recess. No spinal canal stenosis. L1-L2: Normal disc space and facet joints. No spinal canal stenosis. No neural foraminal stenosis. L2-L3: Left asymmetric disc bulge. No spinal canal stenosis. No neural foraminal stenosis. L3-L4: Small disc bulge. No spinal canal stenosis. No neural foraminal stenosis. L4-L5: Facet arthrosis with small amount of fluid in the left facet joint. Small left foraminal annular fissure could irritate the exiting left L4 nerve root. No spinal canal stenosis. No neural foraminal stenosis. L5-S1: Mild facet arthrosis. No spinal canal stenosis. No neural foraminal stenosis. Visualized sacrum: Normal. IMPRESSION: 1. No discitis-osteomyelitis or epidural abscess. 2. Small focus of hyperintense T2-weighted signal at the right subarticular recess at T12-L1. This is favored to be an extruded or sequestered disc fragment. 3. Opposing endplate edema at H4-V4, favored to be degenerative. 4. Grade 1 anterolisthesis at L4-L5 due to facet arthrosis. 5. No spinal canal or neural foraminal stenosis. Electronically Signed   By: Deatra Robinson M.D.   On: 04/01/2023 01:21   US PELVIS TRANSVAGINAL NON-OB (TV ONLY)  Result Date: 03/31/2023 CLINICAL DATA:  Pelvic pain.  Vaginal bleeding. EXAM: ULTRASOUND PELVIS TRANSVAGINAL TECHNIQUE: Transvaginal ultrasound examination of the pelvis was performed including evaluation of the uterus, ovaries, adnexal regions, and pelvic cul-de-sac. COMPARISON:  CT 03/31/2023.  Ultrasound September 2015 FINDINGS:  Uterus Measurements: 11.2 x 5.1 x 6.7 cm = volume: 202 mL. Slightly heterogeneous myometrium. Evaluation limited by bowel gas and soft tissue. Endometrium Thickness: 29 mm. Abnormally thickened with some cystic areas. No clear abnormal flow on color Doppler. Ovaries Neither ovary is seen on this examination. This may related to overlapping bowel gas and soft tissue Other findings:  No abnormal free fluid IMPRESSION: Thickened and heterogeneous endometrium. Possibilities include an aggressive or neoplastic process and recommend further evaluation. Poor visualization of the ovaries.  No free fluid in the pelvis. Electronically Signed   By: Karen Kays M.D.   On: 03/31/2023 10:02   CT ABDOMEN PELVIS W CONTRAST  Result Date: 03/31/2023 CLINICAL DATA:  Three-week history of right flank pain, hematuria, and polyuria. EXAM: CT ANGIOGRAPHY CHEST CT ABDOMEN AND PELVIS WITH CONTRAST TECHNIQUE: Multidetector CT imaging of the chest was performed using the standard protocol during bolus administration of intravenous contrast. Multiplanar CT image reconstructions and MIPs were obtained to evaluate the vascular anatomy. Multidetector CT imaging of the abdomen and pelvis was performed using the standard protocol during bolus administration of intravenous contrast. RADIATION DOSE REDUCTION: This exam was performed according to the departmental dose-optimization program which includes automated exposure control, adjustment of the mA and/or kV according to patient size and/or use of iterative reconstruction technique. CONTRAST:  75mL OMNIPAQUE IOHEXOL 350 MG/ML SOLN COMPARISON:  CT abdomen and pelvis  dated 03/30/2023, CTA chest dated 03/28/2021 FINDINGS: CTA CHEST FINDINGS Cardiovascular: The study is high quality for the evaluation of pulmonary embolism. There are no filling defects in the central, lobar, segmental or subsegmental pulmonary artery branches to suggest acute pulmonary embolism. Great vessels are normal in course  and caliber. Normal heart size. No significant pericardial fluid/thickening. Mediastinum/Nodes: Imaged thyroid gland without nodules meeting criteria for imaging follow-up by size. Normal esophagus. No pathologically enlarged axillary, supraclavicular, mediastinal, or hilar lymph nodes. Lungs/Pleura: The central airways are patent. Scattered subsegmental mucous plugging, most notable in the right upper lobe. Mild diffuse mosaic attenuation. No focal consolidation. No pneumothorax. No pleural effusion. Musculoskeletal: No acute or abnormal lytic or blastic osseous lesions. Multilevel degenerative changes of the thoracic spine. Bandlike density within the left breast at 3 o'clock measures 2.8 x 0.9 cm (5:72), previously 2.3 x 0.7 cm. Review of the MIP images confirms the above findings. CT ABDOMEN and PELVIS FINDINGS Hepatobiliary: No focal hepatic lesions. No intra or extrahepatic biliary ductal dilation. Normal gallbladder. Pancreas: No focal lesions or main ductal dilation. Spleen: Normal in size without focal abnormality. Adrenals/Urinary Tract: No adrenal nodules. No suspicious renal mass or hydronephrosis. Unchanged punctate nonobstructing left upper pole stone. Bladder is underdistended. Stomach/Bowel: Normal appearance of the stomach. Similar mural thickening of the distal sigmoid colon with mild pericolonic stranding. Colonic diverticulosis. Normal appendix. Vascular/Lymphatic: Aortic atherosclerosis. No enlarged abdominal or pelvic lymph nodes. Reproductive: Again seen is thickening and heterogeneous appearance of the endometrium measuring up to 3.0 cm. There is expanded appearance of the lower uterine segment with infiltrative hypodensity. No adnexal masses. Other: No free fluid, fluid collection, or free air. Musculoskeletal: No acute or abnormal lytic or blastic osseous lesions. Severe degenerative changes of the right hip. IMPRESSION: 1. No evidence of pulmonary embolism. 2. Mild diffuse mosaic  attenuation of the lungs, which is nonspecific but can be seen in the setting of small airways disease. 3. Similar mural thickening of the distal sigmoid colon with mild pericolonic stranding, which may represent sequela of infection/inflammation, however underlying neoplasm can not be excluded. Recommend correlation with colonoscopy. 4. Again seen is abnormal thickening and heterogeneous appearance of the endometrium measuring up to 3.0 cm. Per clinical records, pelvic ultrasound examination is planned. 5. Bandlike density within the left breast at 3 o'clock measures 2.8 x 0.9 cm, previously 2.3 x 0.7 cm. Recommend correlation with targeted breast ultrasound examination. 6.  Aortic Atherosclerosis (ICD10-I70.0). Electronically Signed   By: Agustin Cree M.D.   On: 03/31/2023 09:27   CT Angio Chest PE W and/or Wo Contrast  Result Date: 03/31/2023 CLINICAL DATA:  Three-week history of right flank pain, hematuria, and polyuria. EXAM: CT ANGIOGRAPHY CHEST CT ABDOMEN AND PELVIS WITH CONTRAST TECHNIQUE: Multidetector CT imaging of the chest was performed using the standard protocol during bolus administration of intravenous contrast. Multiplanar CT image reconstructions and MIPs were obtained to evaluate the vascular anatomy. Multidetector CT imaging of the abdomen and pelvis was performed using the standard protocol during bolus administration of intravenous contrast. RADIATION DOSE REDUCTION: This exam was performed according to the departmental dose-optimization program which includes automated exposure control, adjustment of the mA and/or kV according to patient size and/or use of iterative reconstruction technique. CONTRAST:  75mL OMNIPAQUE IOHEXOL 350 MG/ML SOLN COMPARISON:  CT abdomen and pelvis dated 03/30/2023, CTA chest dated 03/28/2021 FINDINGS: CTA CHEST FINDINGS Cardiovascular: The study is high quality for the evaluation of pulmonary embolism. There are no filling defects in the central, lobar,  segmental or  subsegmental pulmonary artery branches to suggest acute pulmonary embolism. Great vessels are normal in course and caliber. Normal heart size. No significant pericardial fluid/thickening. Mediastinum/Nodes: Imaged thyroid gland without nodules meeting criteria for imaging follow-up by size. Normal esophagus. No pathologically enlarged axillary, supraclavicular, mediastinal, or hilar lymph nodes. Lungs/Pleura: The central airways are patent. Scattered subsegmental mucous plugging, most notable in the right upper lobe. Mild diffuse mosaic attenuation. No focal consolidation. No pneumothorax. No pleural effusion. Musculoskeletal: No acute or abnormal lytic or blastic osseous lesions. Multilevel degenerative changes of the thoracic spine. Bandlike density within the left breast at 3 o'clock measures 2.8 x 0.9 cm (5:72), previously 2.3 x 0.7 cm. Review of the MIP images confirms the above findings. CT ABDOMEN and PELVIS FINDINGS Hepatobiliary: No focal hepatic lesions. No intra or extrahepatic biliary ductal dilation. Normal gallbladder. Pancreas: No focal lesions or main ductal dilation. Spleen: Normal in size without focal abnormality. Adrenals/Urinary Tract: No adrenal nodules. No suspicious renal mass or hydronephrosis. Unchanged punctate nonobstructing left upper pole stone. Bladder is underdistended. Stomach/Bowel: Normal appearance of the stomach. Similar mural thickening of the distal sigmoid colon with mild pericolonic stranding. Colonic diverticulosis. Normal appendix. Vascular/Lymphatic: Aortic atherosclerosis. No enlarged abdominal or pelvic lymph nodes. Reproductive: Again seen is thickening and heterogeneous appearance of the endometrium measuring up to 3.0 cm. There is expanded appearance of the lower uterine segment with infiltrative hypodensity. No adnexal masses. Other: No free fluid, fluid collection, or free air. Musculoskeletal: No acute or abnormal lytic or blastic osseous lesions. Severe  degenerative changes of the right hip. IMPRESSION: 1. No evidence of pulmonary embolism. 2. Mild diffuse mosaic attenuation of the lungs, which is nonspecific but can be seen in the setting of small airways disease. 3. Similar mural thickening of the distal sigmoid colon with mild pericolonic stranding, which may represent sequela of infection/inflammation, however underlying neoplasm can not be excluded. Recommend correlation with colonoscopy. 4. Again seen is abnormal thickening and heterogeneous appearance of the endometrium measuring up to 3.0 cm. Per clinical records, pelvic ultrasound examination is planned. 5. Bandlike density within the left breast at 3 o'clock measures 2.8 x 0.9 cm, previously 2.3 x 0.7 cm. Recommend correlation with targeted breast ultrasound examination. 6.  Aortic Atherosclerosis (ICD10-I70.0). Electronically Signed   By: Agustin Cree M.D.   On: 03/31/2023 09:27        Scheduled Meds:  acetaminophen  1,000 mg Oral Q6H   amLODipine  5 mg Oral Daily   aspirin EC  81 mg Oral Daily   baclofen  5 mg Oral BID   carvedilol  3.125 mg Oral BID   lidocaine  1 patch Transdermal Q24H   megestrol  40 mg Oral BID   rosuvastatin  5 mg Oral QODAY   Continuous Infusions:     LOS: 0 days    Time spent: 35 minutes    Dorcas Carrow, MD [facility]

## 2023-04-02 DIAGNOSIS — R52 Pain, unspecified: Secondary | ICD-10-CM | POA: Diagnosis not present

## 2023-04-02 MED ORDER — ACETAMINOPHEN 500 MG PO TABS: 1000.0000 mg | ORAL_TABLET | Freq: Four times a day (QID) | ORAL | 0 refills | Status: AC | PRN

## 2023-04-02 MED ORDER — OXYCODONE HCL 5 MG PO TABS: 5.0000 mg | ORAL_TABLET | Freq: Four times a day (QID) | ORAL | 0 refills | Status: DC | PRN

## 2023-04-02 MED ORDER — MELOXICAM 7.5 MG PO TABS: 7.5000 mg | ORAL_TABLET | Freq: Every day | ORAL | 0 refills | Status: AC

## 2023-04-02 MED ORDER — BACLOFEN 5 MG PO TABS: 5.0000 mg | ORAL_TABLET | Freq: Two times a day (BID) | ORAL | 0 refills | Status: AC

## 2023-04-02 MED ORDER — LIDOCAINE 5 % EX PTCH: 1.0000 | MEDICATED_PATCH | CUTANEOUS | 0 refills | Status: DC

## 2023-04-02 MED ORDER — MEGESTROL ACETATE 40 MG PO TABS: 40.0000 mg | ORAL_TABLET | Freq: Two times a day (BID) | ORAL | 0 refills | Status: AC

## 2023-04-02 NOTE — Discharge Summary (Signed)
Physician Discharge Summary  Kathleen [name] XBM:[id number] DOB: 05/04/1966 DOA: 03/31/2023  PCP: [name], NP  Admit date: 03/31/2023 Discharge date: 04/02/2023  Admitted From: Home Disposition: Home  Recommendations for Outpatient Follow-up:  Follow up with PCP in 1-2 weeks  Discharge Condition: Stable CODE STATUS: Full code Diet recommendation: Regular diet, low-salt  Discharge summary: 57 year old with history of hypertension, rheumatoid arthritis and nephrolithiasis presenting with about 3 weeks of right-sided flank pain.  Multiple outpatient investigations but persistent pain.  Not relieved by oral pain medications. In the emergency room hemodynamically stable.  Multiple investigations are negative but patient continued to have pain so she was admitted.   # Intractable right flank pain: Abdominal ultrasound essentially normal.  Previous ultrasound reported 1.2 cm right renal stone not present on CT scan. CT scan chest abdomen pelvis without any abnormalities except uterine abnormality as below. MRI of the thoracic and lumbar spine with some osteoarthritic changes. Pain is likely due to muscle spasm, radiating back pain from the spine or recently passed kidney stone. She was treated with oxycodone, scheduled baclofen, Tylenol and lidocaine patches.  Her pain is much improved today. Plan: Will treat as musculoskeletal pain.  She will use scheduled meloxicam and baclofen for 5 days, she will use lidocaine regularly. Tylenol as needed up to 4 g a day for mild pain Oxycodone 5 mg every 6 hours as needed for moderate to severe pain.   Endometrial thickening/postmeal possible spotting: Transvaginal ultrasound showed thickened endometrium.  Patient will need biopsy.  Seen by gynecology and they have a schedule for outpatient follow-up.  OB/GYN recommended to discharge patient on Megace.  Prescribed.   Abnormal breast on CT scan: Patient will need outpatient mammogram.  She is  aware.   Essential hypertension: Stable    Atherosclerotic disease: On aspirin and statin.  Stable for discharge.     Discharge Diagnoses:  Principal Problem:   Intractable pain Active Problems:   Obstructive chronic bronchitis without exacerbation   PAD (peripheral artery disease) (HCC)   Type 2 diabetes mellitus with diabetic peripheral angiopathy without gangrene, without long-term current use of insulin [facility])    Discharge Instructions  Discharge Instructions     Diet - low sodium heart healthy   Complete by: As directed    Increase activity slowly   Complete by: As directed       Allergies as of 04/02/2023       Reactions   Bee Venom Anaphylaxis, Swelling   Penicillins Itching        Medication List     STOP taking these medications    oxyCODONE-acetaminophen 5-325 MG tablet Commonly known as: PERCOCET/ROXICET   phenazopyridine 200 MG tablet Commonly known as: PYRIDIUM   tiZANidine 4 MG tablet Commonly known as: ZANAFLEX       TAKE these medications    acetaminophen 500 MG tablet Commonly known as: TYLENOL Take 2 tablets (1,000 mg total) by mouth every 6 (six) hours as needed.   aspirin EC 81 MG tablet Take 1 tablet (81 mg total) by mouth daily. Swallow whole.   Baclofen 5 MG Tabs Take 1 tablet (5 mg total) by mouth 2 (two) times daily for 5 days.   BIOFREEZE ROLL-ON EX Apply 1 Application topically as needed (knee pain).   carvedilol 25 MG tablet Commonly known as: COREG Take 1 tablet (25 mg total) by mouth 2 (two) times daily.   cholecalciferol 25 MCG (1000 UNIT) tablet Commonly known as: VITAMIN D3 Take 1,000 Units  by mouth daily in the afternoon.   Collagen-Vitamin C 1000-10 MG Tabs Take 1 tablet by mouth daily.   Fish Oil 1000 MG Caps Take 1 capsule by mouth at bedtime.   lidocaine 5 % Commonly known as: LIDODERM Place 1 patch onto the skin daily. Remove & Discard patch within 12 hours or as directed by MD   megestrol  40 MG tablet Commonly known as: MEGACE Take 1 tablet (40 mg total) by mouth 2 (two) times daily.   meloxicam 7.5 MG tablet Commonly known as: MOBIC Take 1 tablet (7.5 mg total) by mouth daily for 5 days.   Olmesartan-amLODIPine-HCTZ 40-5-12.5 MG Tabs Take 1 tablet by mouth daily. What changed: when to take this   ondansetron 4 MG disintegrating tablet Commonly known as: ZOFRAN-ODT Take 1 tablet (4 mg total) by mouth every 8 (eight) hours as needed for nausea or vomiting.   ONE-A-DAY ENERGY PO Take 1 tablet by mouth daily.   OSTEO BI-FLEX/5-LOXIN ADVANCED PO Take 2 tablets by mouth daily.   oxyCODONE 5 MG immediate release tablet Commonly known as: Oxy IR/ROXICODONE Take 1 tablet (5 mg total) by mouth every 6 (six) hours as needed for up to 5 days for moderate pain or severe pain.   rosuvastatin 5 MG tablet Commonly known as: CRESTOR Take 1 tablet (5 mg total) by mouth every other day.   VITAMIN B12 PO Take 1 tablet by mouth daily.        Allergies  Allergen Reactions   Bee Venom Anaphylaxis and Swelling   Penicillins Itching    Consultations: OB/GYN   Procedures/Studies: MR Lumbar Spine W Wo Contrast  Result Date: 04/01/2023 CLINICAL DATA:  Mid and low back pain with infection suspected EXAM: MRI THORACIC AND LUMBAR SPINE WITHOUT AND WITH CONTRAST TECHNIQUE: Multiplanar and multiecho pulse sequences of the thoracic and lumbar spine were obtained without and with intravenous contrast. CONTRAST:  9mL GADAVIST GADOBUTROL 1 MMOL/ML IV SOLN COMPARISON:  None Available. FINDINGS: MRI THORACIC SPINE FINDINGS Alignment:  Normal Vertebrae: Opposing endplate edema at X9-J4, favored to be degenerative. Normal signal within the disc space. Cord:  Normal signal and morphology. Paraspinal and other soft tissues: Negative. Disc levels: There is no spinal canal stenosis. MRI LUMBAR SPINE FINDINGS Segmentation:  Standard Alignment:  Grade 1 anterolisthesis at L4-5 Vertebrae:  No  fracture, evidence of discitis, or bone lesion. Conus medullaris: Extends to the L2 level and appears normal. Paraspinal and other soft tissues: Negative Disc levels: T12-L1: Small focus of hyperintense T2-weighted signal at the right subarticular recess. No spinal canal stenosis. L1-L2: Normal disc space and facet joints. No spinal canal stenosis. No neural foraminal stenosis. L2-L3: Left asymmetric disc bulge. No spinal canal stenosis. No neural foraminal stenosis. L3-L4: Small disc bulge. No spinal canal stenosis. No neural foraminal stenosis. L4-L5: Facet arthrosis with small amount of fluid in the left facet joint. Small left foraminal annular fissure could irritate the exiting left L4 nerve root. No spinal canal stenosis. No neural foraminal stenosis. L5-S1: Mild facet arthrosis. No spinal canal stenosis. No neural foraminal stenosis. Visualized sacrum: Normal. IMPRESSION: 1. No discitis-osteomyelitis or epidural abscess. 2. Small focus of hyperintense T2-weighted signal at the right subarticular recess at T12-L1. This is favored to be an extruded or sequestered disc fragment. 3. Opposing endplate edema at N8-G9, favored to be degenerative. 4. Grade 1 anterolisthesis at L4-L5 due to facet arthrosis. 5. No spinal canal or neural foraminal stenosis. Electronically Signed   By: Chrisandra Netters.D.  On: 04/01/2023 01:21   MR THORACIC SPINE W WO CONTRAST  Result Date: 04/01/2023 CLINICAL DATA:  Mid and low back pain with infection suspected EXAM: MRI THORACIC AND LUMBAR SPINE WITHOUT AND WITH CONTRAST TECHNIQUE: Multiplanar and multiecho pulse sequences of the thoracic and lumbar spine were obtained without and with intravenous contrast. CONTRAST:  9mL GADAVIST GADOBUTROL 1 MMOL/ML IV SOLN COMPARISON:  None Available. FINDINGS: MRI THORACIC SPINE FINDINGS Alignment:  Normal Vertebrae: Opposing endplate edema at N8-G9, favored to be degenerative. Normal signal within the disc space. Cord:  Normal signal and  morphology. Paraspinal and other soft tissues: Negative. Disc levels: There is no spinal canal stenosis. MRI LUMBAR SPINE FINDINGS Segmentation:  Standard Alignment:  Grade 1 anterolisthesis at L4-5 Vertebrae:  No fracture, evidence of discitis, or bone lesion. Conus medullaris: Extends to the L2 level and appears normal. Paraspinal and other soft tissues: Negative Disc levels: T12-L1: Small focus of hyperintense T2-weighted signal at the right subarticular recess. No spinal canal stenosis. L1-L2: Normal disc space and facet joints. No spinal canal stenosis. No neural foraminal stenosis. L2-L3: Left asymmetric disc bulge. No spinal canal stenosis. No neural foraminal stenosis. L3-L4: Small disc bulge. No spinal canal stenosis. No neural foraminal stenosis. L4-L5: Facet arthrosis with small amount of fluid in the left facet joint. Small left foraminal annular fissure could irritate the exiting left L4 nerve root. No spinal canal stenosis. No neural foraminal stenosis. L5-S1: Mild facet arthrosis. No spinal canal stenosis. No neural foraminal stenosis. Visualized sacrum: Normal. IMPRESSION: 1. No discitis-osteomyelitis or epidural abscess. 2. Small focus of hyperintense T2-weighted signal at the right subarticular recess at T12-L1. This is favored to be an extruded or sequestered disc fragment. 3. Opposing endplate edema at F6-O1, favored to be degenerative. 4. Grade 1 anterolisthesis at L4-L5 due to facet arthrosis. 5. No spinal canal or neural foraminal stenosis. Electronically Signed   By: Deatra Robinson M.D.   On: 04/01/2023 01:21   US PELVIS TRANSVAGINAL NON-OB (TV ONLY)  Result Date: 03/31/2023 CLINICAL DATA:  Pelvic pain.  Vaginal bleeding. EXAM: ULTRASOUND PELVIS TRANSVAGINAL TECHNIQUE: Transvaginal ultrasound examination of the pelvis was performed including evaluation of the uterus, ovaries, adnexal regions, and pelvic cul-de-sac. COMPARISON:  CT 03/31/2023.  Ultrasound September 2015 FINDINGS: Uterus  Measurements: 11.2 x 5.1 x 6.7 cm = volume: 202 mL. Slightly heterogeneous myometrium. Evaluation limited by bowel gas and soft tissue. Endometrium Thickness: 29 mm. Abnormally thickened with some cystic areas. No clear abnormal flow on color Doppler. Ovaries Neither ovary is seen on this examination. This may related to overlapping bowel gas and soft tissue Other findings:  No abnormal free fluid IMPRESSION: Thickened and heterogeneous endometrium. Possibilities include an aggressive or neoplastic process and recommend further evaluation. Poor visualization of the ovaries.  No free fluid in the pelvis. Electronically Signed   By: Karen Kays M.D.   On: 03/31/2023 10:02   CT ABDOMEN PELVIS W CONTRAST  Result Date: 03/31/2023 CLINICAL DATA:  Three-week history of right flank pain, hematuria, and polyuria. EXAM: CT ANGIOGRAPHY CHEST CT ABDOMEN AND PELVIS WITH CONTRAST TECHNIQUE: Multidetector CT imaging of the chest was performed using the standard protocol during bolus administration of intravenous contrast. Multiplanar CT image reconstructions and MIPs were obtained to evaluate the vascular anatomy. Multidetector CT imaging of the abdomen and pelvis was performed using the standard protocol during bolus administration of intravenous contrast. RADIATION DOSE REDUCTION: This exam was performed according to the departmental dose-optimization program which includes automated exposure control, adjustment of  the mA and/or kV according to patient size and/or use of iterative reconstruction technique. CONTRAST:  75mL OMNIPAQUE IOHEXOL 350 MG/ML SOLN COMPARISON:  CT abdomen and pelvis dated 03/30/2023, CTA chest dated 03/28/2021 FINDINGS: CTA CHEST FINDINGS Cardiovascular: The study is high quality for the evaluation of pulmonary embolism. There are no filling defects in the central, lobar, segmental or subsegmental pulmonary artery branches to suggest acute pulmonary embolism. Great vessels are normal in course and  caliber. Normal heart size. No significant pericardial fluid/thickening. Mediastinum/Nodes: Imaged thyroid gland without nodules meeting criteria for imaging follow-up by size. Normal esophagus. No pathologically enlarged axillary, supraclavicular, mediastinal, or hilar lymph nodes. Lungs/Pleura: The central airways are patent. Scattered subsegmental mucous plugging, most notable in the right upper lobe. Mild diffuse mosaic attenuation. No focal consolidation. No pneumothorax. No pleural effusion. Musculoskeletal: No acute or abnormal lytic or blastic osseous lesions. Multilevel degenerative changes of the thoracic spine. Bandlike density within the left breast at 3 o'clock measures 2.8 x 0.9 cm (5:72), previously 2.3 x 0.7 cm. Review of the MIP images confirms the above findings. CT ABDOMEN and PELVIS FINDINGS Hepatobiliary: No focal hepatic lesions. No intra or extrahepatic biliary ductal dilation. Normal gallbladder. Pancreas: No focal lesions or main ductal dilation. Spleen: Normal in size without focal abnormality. Adrenals/Urinary Tract: No adrenal nodules. No suspicious renal mass or hydronephrosis. Unchanged punctate nonobstructing left upper pole stone. Bladder is underdistended. Stomach/Bowel: Normal appearance of the stomach. Similar mural thickening of the distal sigmoid colon with mild pericolonic stranding. Colonic diverticulosis. Normal appendix. Vascular/Lymphatic: Aortic atherosclerosis. No enlarged abdominal or pelvic lymph nodes. Reproductive: Again seen is thickening and heterogeneous appearance of the endometrium measuring up to 3.0 cm. There is expanded appearance of the lower uterine segment with infiltrative hypodensity. No adnexal masses. Other: No free fluid, fluid collection, or free air. Musculoskeletal: No acute or abnormal lytic or blastic osseous lesions. Severe degenerative changes of the right hip. IMPRESSION: 1. No evidence of pulmonary embolism. 2. Mild diffuse mosaic attenuation  of the lungs, which is nonspecific but can be seen in the setting of small airways disease. 3. Similar mural thickening of the distal sigmoid colon with mild pericolonic stranding, which may represent sequela of infection/inflammation, however underlying neoplasm can not be excluded. Recommend correlation with colonoscopy. 4. Again seen is abnormal thickening and heterogeneous appearance of the endometrium measuring up to 3.0 cm. Per clinical records, pelvic ultrasound examination is planned. 5. Bandlike density within the left breast at 3 o'clock measures 2.8 x 0.9 cm, previously 2.3 x 0.7 cm. Recommend correlation with targeted breast ultrasound examination. 6.  Aortic Atherosclerosis (ICD10-I70.0). Electronically Signed   By: Agustin Cree M.D.   On: 03/31/2023 09:27   CT Angio Chest PE W and/or Wo Contrast  Result Date: 03/31/2023 CLINICAL DATA:  Three-week history of right flank pain, hematuria, and polyuria. EXAM: CT ANGIOGRAPHY CHEST CT ABDOMEN AND PELVIS WITH CONTRAST TECHNIQUE: Multidetector CT imaging of the chest was performed using the standard protocol during bolus administration of intravenous contrast. Multiplanar CT image reconstructions and MIPs were obtained to evaluate the vascular anatomy. Multidetector CT imaging of the abdomen and pelvis was performed using the standard protocol during bolus administration of intravenous contrast. RADIATION DOSE REDUCTION: This exam was performed according to the departmental dose-optimization program which includes automated exposure control, adjustment of the mA and/or kV according to patient size and/or use of iterative reconstruction technique. CONTRAST:  75mL OMNIPAQUE IOHEXOL 350 MG/ML SOLN COMPARISON:  CT abdomen and pelvis dated 03/30/2023, CTA  chest dated 03/28/2021 FINDINGS: CTA CHEST FINDINGS Cardiovascular: The study is high quality for the evaluation of pulmonary embolism. There are no filling defects in the central, lobar, segmental or  subsegmental pulmonary artery branches to suggest acute pulmonary embolism. Great vessels are normal in course and caliber. Normal heart size. No significant pericardial fluid/thickening. Mediastinum/Nodes: Imaged thyroid gland without nodules meeting criteria for imaging follow-up by size. Normal esophagus. No pathologically enlarged axillary, supraclavicular, mediastinal, or hilar lymph nodes. Lungs/Pleura: The central airways are patent. Scattered subsegmental mucous plugging, most notable in the right upper lobe. Mild diffuse mosaic attenuation. No focal consolidation. No pneumothorax. No pleural effusion. Musculoskeletal: No acute or abnormal lytic or blastic osseous lesions. Multilevel degenerative changes of the thoracic spine. Bandlike density within the left breast at 3 o'clock measures 2.8 x 0.9 cm (5:72), previously 2.3 x 0.7 cm. Review of the MIP images confirms the above findings. CT ABDOMEN and PELVIS FINDINGS Hepatobiliary: No focal hepatic lesions. No intra or extrahepatic biliary ductal dilation. Normal gallbladder. Pancreas: No focal lesions or main ductal dilation. Spleen: Normal in size without focal abnormality. Adrenals/Urinary Tract: No adrenal nodules. No suspicious renal mass or hydronephrosis. Unchanged punctate nonobstructing left upper pole stone. Bladder is underdistended. Stomach/Bowel: Normal appearance of the stomach. Similar mural thickening of the distal sigmoid colon with mild pericolonic stranding. Colonic diverticulosis. Normal appendix. Vascular/Lymphatic: Aortic atherosclerosis. No enlarged abdominal or pelvic lymph nodes. Reproductive: Again seen is thickening and heterogeneous appearance of the endometrium measuring up to 3.0 cm. There is expanded appearance of the lower uterine segment with infiltrative hypodensity. No adnexal masses. Other: No free fluid, fluid collection, or free air. Musculoskeletal: No acute or abnormal lytic or blastic osseous lesions. Severe  degenerative changes of the right hip. IMPRESSION: 1. No evidence of pulmonary embolism. 2. Mild diffuse mosaic attenuation of the lungs, which is nonspecific but can be seen in the setting of small airways disease. 3. Similar mural thickening of the distal sigmoid colon with mild pericolonic stranding, which may represent sequela of infection/inflammation, however underlying neoplasm can not be excluded. Recommend correlation with colonoscopy. 4. Again seen is abnormal thickening and heterogeneous appearance of the endometrium measuring up to 3.0 cm. Per clinical records, pelvic ultrasound examination is planned. 5. Bandlike density within the left breast at 3 o'clock measures 2.8 x 0.9 cm, previously 2.3 x 0.7 cm. Recommend correlation with targeted breast ultrasound examination. 6.  Aortic Atherosclerosis (ICD10-I70.0). Electronically Signed   By: Agustin Cree M.D.   On: 03/31/2023 09:27   CT RENAL STONE STUDY  Result Date: 03/30/2023 CLINICAL DATA:  Abdominal/flank pain EXAM: CT ABDOMEN AND PELVIS WITHOUT CONTRAST TECHNIQUE: Multidetector CT imaging of the abdomen and pelvis was performed following the standard protocol without IV contrast. RADIATION DOSE REDUCTION: This exam was performed according to the departmental dose-optimization program which includes automated exposure control, adjustment of the mA and/or kV according to patient size and/or use of iterative reconstruction technique. COMPARISON:  02/04/2014 FINDINGS: Lower chest: No acute abnormality. Hepatobiliary: No focal liver abnormality is seen. No gallstones, gallbladder wall thickening, or biliary dilatation. Pancreas: Unremarkable. No pancreatic ductal dilatation or surrounding inflammatory changes. Spleen: Normal in size without focal abnormality. Adrenals/Urinary Tract: Normal adrenal glands. No hydronephrosis identified bilaterally. Punctate stone within the upper pole of the left kidney noted, image 76/4. No hydroureter or ureteral calculi.  The urinary bladder appears within normal limits. Stomach/Bowel: Stomach is normal. The appendix is visualized and is within normal limits. Nonspecific asymmetric wall thickening involving the distal  sigmoid colon is identified, image 68/2. Difficult to characterize within partially decompressed and unprepped colon. No signs of bowel wall inflammation. Vascular/Lymphatic: Aortic atherosclerosis. No signs of abdominopelvic adenopathy. Reproductive: There is diffuse thickening of the endometrium which measures approximately 2.5 cm, image 70/5. This is considered an abnormal finding, particularly in a postmenopausal female. No adnexal mass. Other: No free fluid or fluid collections. Musculoskeletal: No acute or significant osseous findings. Severe right hip osteoarthritis. IMPRESSION: 1. No acute findings within the abdomen or pelvis. 2. Punctate nonobstructing left renal calculus. 3. Diffuse thickening of the endometrium which measures approximately 2.5 cm. This is considered an abnormal finding, particularly in a postmenopausal female. Recommend further evaluation with pelvic ultrasound. 4. Nonspecific asymmetric wall thickening involving the distal sigmoid colon. Difficult to characterize within partially decompressed and unprepped colon. Correlation with colon cancer screening is recommended. 5.  Aortic Atherosclerosis (ICD10-I70.0). Electronically Signed   By: Signa Kell M.D.   On: 03/30/2023 14:47   US Renal  Result Date: 03/11/2023 CLINICAL DATA:  Acute right flank pain. EXAM: RENAL / URINARY TRACT ULTRASOUND COMPLETE COMPARISON:  CT abdomen pelvis 02/04/2014 FINDINGS: Right Kidney: Renal measurements: 11.2 x 5.1 x 5.8 cm = volume: 170.3 mL. Echogenicity within normal limits. No mass or hydronephrosis visualized. Note is made of a 12 mm stone midpole right kidney. Left Kidney: Renal measurements: 10.0 x 4.7 x 4.7 cm = volume: 116.9 mL. Echogenicity within normal limits. No mass or hydronephrosis  visualized. Bladder: Nondistended. Other: None. IMPRESSION: 1. No hydronephrosis. 2. Nonobstructing stone right kidney. Electronically Signed   By: Annia Belt M.D.   On: 03/11/2023 22:46   (Echo, Carotid, EGD, Colonoscopy, ERCP)    Subjective: Patient seen and examined.  Only has mild pain today.  Denies any other complaints.  Comfortable with plans to go home.   Discharge Exam: Vitals:   04/01/23 1953 04/02/23 0555  BP: 124/73 117/86  Pulse: 77 94  Resp: 20 18  Temp: 98.7 F (37.1 C) 98 F (36.7 C)  SpO2: 100% 100%   Vitals:   04/01/23 0747 04/01/23 1558 04/01/23 1953 04/02/23 0555  BP: (!) 150/90 130/87 124/73 117/86  Pulse: 97 80 77 94  Resp: 18 18 20 18   Temp: 99.7 F (37.6 C) 98.9 F (37.2 C) 98.7 F (37.1 C) 98 F (36.7 C)  TempSrc: Oral Oral Oral Oral  SpO2: 99% 97% 100% 100%  Weight:      Height:        General: Pt is alert, awake, not in acute distress Cardiovascular: RRR, S1/S2 +, no rubs, no gallops Respiratory: CTA bilaterally, no wheezing, no rhonchi Abdominal: Soft, NT, ND, bowel sounds +, no palpable tenderness. Extremities: no edema, no cyanosis    The results of significant diagnostics from this hospitalization (including imaging, microbiology, ancillary and laboratory) are listed below for reference.     Microbiology: No results found for this or any previous visit (from the past 240 hour(s)).   Labs: BNP (last 3 results) No results for input(s): "BNP" in the last 8760 hours. Basic Metabolic Panel: Recent Labs  Lab 03/31/23 0643 04/01/23 0830  NA 138 137  K 4.0 3.6  CL 103 102  CO2 24 25  GLUCOSE 123* 130*  BUN 18 10  CREATININE 1.01* 0.97  CALCIUM 11.0* 10.5*   Liver Function Tests: Recent Labs  Lab 03/31/23 0643  AST 19  ALT 18  ALKPHOS 68  BILITOT 0.3  PROT 7.8  ALBUMIN 3.9   No results for  input(s): "LIPASE", "AMYLASE" in the last 168 hours. No results for input(s): "AMMONIA" in the last 168 hours. CBC: Recent Labs   Lab 03/31/23 0643 04/01/23 0830  WBC 7.8 6.6  HGB 14.1 13.2  HCT 43.0 40.5  MCV 93.3 90.4  PLT 351 302   Cardiac Enzymes: No results for input(s): "CKTOTAL", "CKMB", "CKMBINDEX", "TROPONINI" in the last 168 hours. BNP: Invalid input(s): "POCBNP" CBG: No results for input(s): "GLUCAP" in the last 168 hours. D-Dimer No results for input(s): "DDIMER" in the last 72 hours. Hgb A1c No results for input(s): "HGBA1C" in the last 72 hours. Lipid Profile No results for input(s): "CHOL", "HDL", "LDLCALC", "TRIG", "CHOLHDL", "LDLDIRECT" in the last 72 hours. Thyroid function studies No results for input(s): "TSH", "T4TOTAL", "T3FREE", "THYROIDAB" in the last 72 hours.  Invalid input(s): "FREET3" Anemia work up No results for input(s): "VITAMINB12", "FOLATE", "FERRITIN", "TIBC", "IRON", "RETICCTPCT" in the last 72 hours. Urinalysis    Component Value Date/Time   COLORURINE YELLOW 03/30/2023 1010   APPEARANCEUR CLEAR 03/30/2023 1010   APPEARANCEUR Clear 08/29/2021 0900   LABSPEC >1.030 (H) 03/30/2023 1010   PHURINE 5.5 03/30/2023 1010   GLUCOSEU NEGATIVE 03/30/2023 1010   HGBUR TRACE (A) 03/30/2023 1010   BILIRUBINUR NEGATIVE 03/30/2023 1010   BILIRUBINUR negative 03/11/2023 1512   BILIRUBINUR Negative 08/29/2021 0900   KETONESUR TRACE (A) 03/30/2023 1010   PROTEINUR 30 (A) 03/30/2023 1010   UROBILINOGEN 0.2 03/11/2023 1512   UROBILINOGEN 2.0 (H) 02/04/2014 1158   NITRITE NEGATIVE 03/30/2023 1010   LEUKOCYTESUR SMALL (A) 03/30/2023 1010   Sepsis Labs Recent Labs  Lab 03/31/23 0643 04/01/23 0830  WBC 7.8 6.6   Microbiology No results found for this or any previous visit (from the past 240 hour(s)).   Time coordinating discharge: 32 minutes  SIGNED:   Dorcas Carrow, MD  [facility] 04/02/2023, 9:49 AM

## 2023-04-06 ENCOUNTER — Ambulatory Visit (INDEPENDENT_AMBULATORY_CARE_PROVIDER_SITE_OTHER): Payer: PRIVATE HEALTH INSURANCE | Admitting: Nurse Practitioner

## 2023-04-06 ENCOUNTER — Encounter: Payer: Self-pay | Admitting: Nurse Practitioner

## 2023-04-06 VITALS — BP 138/80 | HR 79 | Temp 98.3°F | Resp 16 | Ht 64.0 in | Wt 208.6 lb

## 2023-04-06 DIAGNOSIS — Z09 Encounter for follow-up examination after completed treatment for conditions other than malignant neoplasm: Secondary | ICD-10-CM

## 2023-04-06 DIAGNOSIS — R109 Unspecified abdominal pain: Secondary | ICD-10-CM

## 2023-04-06 MED ORDER — TIZANIDINE HCL 4 MG PO TABS: 4.0000 mg | ORAL_TABLET | Freq: Four times a day (QID) | ORAL | 2 refills | Status: DC | PRN

## 2023-04-06 NOTE — Progress Notes (Signed)
[facility] [place], [place] [address] [place] [address] [phone]                                   Transitional Care Clinic   [facility] Discharge Acute Issues Care Follow Up                                                                        Patient Demographics  Kathleen [name], is a 57 y.o. female  DOB 11/04/1965  MRN [id number].  Primary MD  Kathleen Kuster, NP  Admit date: 03/31/2023 Discharge date: 04/02/2023  Reason for TCC follow Up - intractable pain of the right flank.    Past Medical History:  Diagnosis Date   Hypertension    Kidney stone    Rheumatoid arthritis (HCC)     Past Surgical History:  Procedure Laterality Date   CESAREAN SECTION     3 cesarean sections       Recent HPI and Hospital Course  Discharge summary: 57 year old with history of hypertension, rheumatoid arthritis and nephrolithiasis presenting with about 3 weeks of right-sided flank pain.  Multiple outpatient investigations but persistent pain.  Not relieved by oral pain medications. In the emergency room hemodynamically stable.  Multiple investigations are negative but patient continued to have pain so she was admitted.    # Intractable right flank pain: Abdominal ultrasound essentially normal.  Previous ultrasound reported 1.2 cm right renal stone not present on CT scan. CT scan chest abdomen pelvis without any abnormalities except uterine abnormality as below. MRI of the thoracic and lumbar spine with some osteoarthritic changes. Pain is likely due to muscle spasm, radiating back pain from the spine or recently passed kidney stone. She was treated with oxycodone, scheduled baclofen, Tylenol and lidocaine patches.  Her pain is much improved today. Plan: Will treat as musculoskeletal pain.  She will use scheduled meloxicam and baclofen for 5 days, she will use lidocaine regularly. Tylenol as needed up to 4 g a day for mild pain Oxycodone 5 mg  every 6 hours as needed for moderate to severe pain.   Endometrial thickening/postmeal possible spotting: Transvaginal ultrasound showed thickened endometrium.  Patient will need biopsy.  Seen by gynecology and they have a schedule for outpatient follow-up.  OB/GYN recommended to discharge patient on Megace.  Prescribed.   Abnormal breast on CT scan: Patient will need outpatient mammogram.  She is aware.   Essential hypertension: Stable    Atherosclerotic disease: On aspirin and statin.   Stable for discharge.    Post Hospital Acute Care Issue to be followed in the Clinic  Principal Problem:   Intractable pain Active Problems:   Obstructive chronic bronchitis without exacerbation   PAD (peripheral artery disease) (HCC)   Type 2 diabetes mellitus with diabetic peripheral angiopathy without gangrene, without long-term current use of insulin (HCC)   Subjective:   Kathleen [name] today has, No headache, No chest pain, No abdominal pain - No Nausea, No new weakness tingling or numbness, No Cough - SOB. Flank pain is slightly improved.   Assessment & Plan   1. Acute right flank pain Muscle relaxant is  not helping, switch to tizanidine.  - tiZANidine (ZANAFLEX) 4 MG tablet; Take 1-2 tablets (4-8 mg total) by mouth every 6 (six) hours as needed for muscle spasms.  Dispense: 120 tablet; Refill: 2  2. Hospital discharge follow-up Treated in hospital for musculoskeletal pain.     Reason for frequent admissions/ER visits    DM Bronchitis PAD   Objective:   Vitals:   04/06/23 1407  BP: 138/80  Pulse: 79  Resp: 16  Temp: 98.3 F (36.8 C)  SpO2: 97%  Weight: 208 lb 9.6 oz (94.6 kg)  Height: 5\' 4"  (1.626 m)    Wt Readings from Last 3 Encounters:  04/21/23 200 lb 8 oz (90.9 kg)  04/06/23 208 lb 9.6 oz (94.6 kg)  03/31/23 207 lb 9.6 oz (94.2 kg)    Allergies as of 04/06/2023       Reactions   Bee Venom Anaphylaxis, Swelling   Penicillins Itching        Medication  List        Accurate as of April 06, 2023 11:59 PM. If you have any questions, ask your nurse or doctor.          acetaminophen 500 MG tablet Commonly known as: TYLENOL Take 2 tablets (1,000 mg total) by mouth every 6 (six) hours as needed.   aspirin EC 81 MG tablet Take 1 tablet (81 mg total) by mouth daily. Swallow whole.   Baclofen 5 MG Tabs Take 1 tablet (5 mg total) by mouth 2 (two) times daily for 5 days.   BIOFREEZE ROLL-ON EX Apply 1 Application topically as needed (knee pain).   carvedilol 25 MG tablet Commonly known as: COREG Take 1 tablet (25 mg total) by mouth 2 (two) times daily.   cholecalciferol 25 MCG (1000 UNIT) tablet Commonly known as: VITAMIN D3 Take 1,000 Units by mouth daily in the afternoon.   Collagen-Vitamin C 1000-10 MG Tabs Take 1 tablet by mouth daily.   Fish Oil 1000 MG Caps Take 1 capsule by mouth at bedtime.   lidocaine 5 % Commonly known as: LIDODERM Place 1 patch onto the skin daily. Remove & Discard patch within 12 hours or as directed by MD   megestrol 40 MG tablet Commonly known as: MEGACE Take 1 tablet (40 mg total) by mouth 2 (two) times daily.   meloxicam 7.5 MG tablet Commonly known as: MOBIC Take 1 tablet (7.5 mg total) by mouth daily for 5 days.   Olmesartan-amLODIPine-HCTZ 40-5-12.5 MG Tabs Take 1 tablet by mouth daily. What changed: when to take this   ondansetron 4 MG disintegrating tablet Commonly known as: ZOFRAN-ODT Take 1 tablet (4 mg total) by mouth every 8 (eight) hours as needed for nausea or vomiting.   ONE-A-DAY ENERGY PO Take 1 tablet by mouth daily.   OSTEO BI-FLEX/5-LOXIN ADVANCED PO Take 2 tablets by mouth daily.   oxyCODONE 5 MG immediate release tablet Commonly known as: Oxy IR/ROXICODONE Take 1 tablet (5 mg total) by mouth every 6 (six) hours as needed for up to 5 days for moderate pain or severe pain.   rosuvastatin 5 MG tablet Commonly known as: CRESTOR Take 1 tablet (5 mg total) by  mouth every other day.   tiZANidine 4 MG tablet Commonly known as: ZANAFLEX Take 1-2 tablets (4-8 mg total) by mouth every 6 (six) hours as needed for muscle spasms. Started by: Kathleen [name]   VITAMIN B12 PO Take 1 tablet by mouth daily.  Physical Exam: Constitutional: Patient appears well-developed and well-nourished. Not in obvious distress. HENT: Normocephalic, atraumatic, External right and left ear normal. Oropharynx is clear and moist.  Eyes: Conjunctivae and EOM are normal. PERRLA, no scleral icterus. Neck: Normal ROM. Neck supple. No JVD. No tracheal deviation. No thyromegaly. CVS: RRR, S1/S2 +, no murmurs, no gallops, no carotid bruit.  Pulmonary: Effort and breath sounds normal, no stridor, rhonchi, wheezes, rales.  Abdominal: Soft. BS +, no distension, tenderness, rebound or guarding. Flank pain present right side  Musculoskeletal: Normal range of motion. No edema and no tenderness.  Lymphadenopathy: No lymphadenopathy noted, cervical, inguinal or axillary Neuro: Alert. Normal reflexes, muscle tone coordination. No cranial nerve deficit. Skin: Skin is warm and dry. No rash noted. Not diaphoretic. No erythema. No pallor. Psychiatric: Normal mood and affect. Behavior, judgment, thought content normal.   Data Review   Micro Results No results found for this or any previous visit (from the past 240 hour(s)).   CBC No results for input(s): "WBC", "HGB", "HCT", "PLT", "MCV", "MCH", "MCHC", "RDW", "LYMPHSABS", "MONOABS", "EOSABS", "BASOSABS", "BANDABS" in the last 168 hours.  Invalid input(s): "NEUTRABS", "BANDSABD"  Chemistries  No results for input(s): "NA", "K", "CL", "CO2", "GLUCOSE", "BUN", "CREATININE", "CALCIUM", "MG", "AST", "ALT", "ALKPHOS", "BILITOT" in the last 168 hours.  Invalid input(s): "GFRCGP" ------------------------------------------------------------------------------------------------------------------ CrCl cannot be calculated  (Patient's most recent lab result is older than the maximum 21 days allowed.). ------------------------------------------------------------------------------------------------------------------ No results for input(s): "HGBA1C" in the last 72 hours. ------------------------------------------------------------------------------------------------------------------ No results for input(s): "CHOL", "HDL", "LDLCALC", "TRIG", "CHOLHDL", "LDLDIRECT" in the last 72 hours. ------------------------------------------------------------------------------------------------------------------ No results for input(s): "TSH", "T4TOTAL", "T3FREE", "THYROIDAB" in the last 72 hours.  Invalid input(s): "FREET3" ------------------------------------------------------------------------------------------------------------------ No results for input(s): "VITAMINB12", "FOLATE", "FERRITIN", "TIBC", "IRON", "RETICCTPCT" in the last 72 hours.  Coagulation profile No results for input(s): "INR", "PROTIME" in the last 168 hours.  No results for input(s): "DDIMER" in the last 72 hours.  Cardiac Enzymes No results for input(s): "CKMB", "TROPONINI", "MYOGLOBIN" in the last 168 hours.  Invalid input(s): "CK" ------------------------------------------------------------------------------------------------------------------ Invalid input(s): "POCBNP"  Return if symptoms worsen or fail to improve.   Time Spent in minutes  45 Time spent with patient included reviewing progress notes, labs, imaging studies, and discussing plan for follow up.   This patient was seen by Kathleen Kuster, FNP-C in collaboration with Dr. Beverely Risen as a part of collaborative care agreement.    Kathleen Kuster MSN, FNP-C on 04/06/2023 at 2:45 PM   **Disclaimer: This note may have been dictated with voice recognition software. Similar sounding words can inadvertently be transcribed and this note may contain transcription errors which may not  have been corrected upon publication of note.**

## 2023-04-21 ENCOUNTER — Other Ambulatory Visit: Payer: Self-pay

## 2023-04-21 ENCOUNTER — Other Ambulatory Visit (HOSPITAL_COMMUNITY)
Admission: RE | Admit: 2023-04-21 | Discharge: 2023-04-21 | Disposition: A | Discharge status: Home / Self Care | Payer: PRIVATE HEALTH INSURANCE | Source: Ambulatory Visit | Attending: Obstetrics and Gynecology | Admitting: Obstetrics and Gynecology

## 2023-04-21 ENCOUNTER — Ambulatory Visit (INDEPENDENT_AMBULATORY_CARE_PROVIDER_SITE_OTHER): Payer: PRIVATE HEALTH INSURANCE | Admitting: Obstetrics and Gynecology

## 2023-04-21 ENCOUNTER — Encounter: Payer: Self-pay | Admitting: Obstetrics and Gynecology

## 2023-04-21 VITALS — BP 99/61 | HR 86 | Ht 64.0 in | Wt 200.5 lb

## 2023-04-21 DIAGNOSIS — N950 Postmenopausal bleeding: Secondary | ICD-10-CM | POA: Diagnosis present

## 2023-04-21 DIAGNOSIS — R9389 Abnormal findings on diagnostic imaging of other specified body structures: Secondary | ICD-10-CM | POA: Insufficient documentation

## 2023-04-21 NOTE — Progress Notes (Signed)
  CC: postmenopausal bleeding Subjective:    Patient ID: Kathleen [name], female    DOB: 11-17-1965, 57 y.o.   MRN: [id number]  HPI 57 yo G6P3 , c/s x 3, seen for evaluation of postmenopausal bleeding.  Pt was seen in hospital setting for back and side pain.  Pt also has noticed postmenopausal bleeding/spotting during the last few months.  CT and ultrasound have shown thickened endometrial stripe.  Pt was supposed to have endometrial biopsy, but a large uterine mass suspicious for a prolapsed necrotic uterine polyp was noted instead.   Mass was too large for full removal in office due to patient discomfort.  Decision made for biopsy of mass prior to determining route of removal.    Mass and vagina cleaned x 2 with betadine.  Mass was fleshy and pale pink/tan in color.  Darker areas resembling necrotic tissue were noted on the posterior side of the mass.  4-5 biopsies of tissue were taken in sterile fashion.  Path is pending.  Pictures of mass can be found in media.   Review of Systems     Objective:   Physical Exam Vitals:   04/21/23 1356  BP: 99/61  Pulse: 86         Assessment & Plan:   1. Postmenopausal bleeding Believe etiology of bleeding is the vagina/uterine mass.  Path from biopsies is pending. This would also explain the thickened endometrium.  If the mass is a large polyp, would recommend removal in the OR followed bu hysteroscopy D and C.  F/u in 1 month to discuss results and plan of care. - Surgical pathology( [place]/ [facility])  2. Thickened endometrium See above. - Surgical pathology( [place]/ [facility])    Warden Fillers, MD Faculty Attending, [facility] for [facility]

## 2023-04-26 ENCOUNTER — Telehealth: Payer: Self-pay

## 2023-04-26 LAB — SURGICAL PATHOLOGY

## 2023-04-28 MED ORDER — LIDOCAINE 5 % EX PTCH: 1.0000 | MEDICATED_PATCH | CUTANEOUS | 0 refills | Status: DC

## 2023-04-28 MED ORDER — OXYCODONE HCL 5 MG PO TABS: 5.0000 mg | ORAL_TABLET | Freq: Four times a day (QID) | ORAL | 0 refills | Status: DC | PRN

## 2023-04-28 NOTE — Telephone Encounter (Signed)
Pt advised we sent med  

## 2023-05-13 ENCOUNTER — Other Ambulatory Visit (INDEPENDENT_AMBULATORY_CARE_PROVIDER_SITE_OTHER): Payer: Self-pay | Admitting: Nurse Practitioner

## 2023-05-13 DIAGNOSIS — I739 Peripheral vascular disease, unspecified: Secondary | ICD-10-CM

## 2023-05-14 ENCOUNTER — Telehealth (INDEPENDENT_AMBULATORY_CARE_PROVIDER_SITE_OTHER): Payer: Self-pay | Admitting: Vascular Surgery

## 2023-05-14 ENCOUNTER — Encounter (INDEPENDENT_AMBULATORY_CARE_PROVIDER_SITE_OTHER): Payer: PRIVATE HEALTH INSURANCE

## 2023-05-14 ENCOUNTER — Ambulatory Visit (INDEPENDENT_AMBULATORY_CARE_PROVIDER_SITE_OTHER): Payer: PRIVATE HEALTH INSURANCE | Admitting: Vascular Surgery

## 2023-05-14 NOTE — Telephone Encounter (Signed)
LVM for pt TCB and R/S appt from 10.25.24

## 2023-05-21 ENCOUNTER — Other Ambulatory Visit: Payer: Self-pay | Admitting: Nurse Practitioner

## 2023-05-30 ENCOUNTER — Encounter: Payer: Self-pay | Admitting: Nurse Practitioner

## 2023-06-04 ENCOUNTER — Ambulatory Visit: Payer: PRIVATE HEALTH INSURANCE | Admitting: Obstetrics and Gynecology

## 2023-06-04 ENCOUNTER — Encounter: Payer: Self-pay | Admitting: Obstetrics and Gynecology

## 2023-06-04 ENCOUNTER — Other Ambulatory Visit: Payer: Self-pay

## 2023-06-04 VITALS — BP 120/70 | HR 93 | Ht 64.0 in | Wt 197.4 lb

## 2023-06-04 DIAGNOSIS — N938 Other specified abnormal uterine and vaginal bleeding: Secondary | ICD-10-CM | POA: Insufficient documentation

## 2023-06-04 DIAGNOSIS — N950 Postmenopausal bleeding: Secondary | ICD-10-CM | POA: Diagnosis not present

## 2023-06-04 DIAGNOSIS — N841 Polyp of cervix uteri: Secondary | ICD-10-CM | POA: Diagnosis not present

## 2023-06-04 DIAGNOSIS — R9389 Abnormal findings on diagnostic imaging of other specified body structures: Secondary | ICD-10-CM | POA: Diagnosis not present

## 2023-06-04 DIAGNOSIS — N939 Abnormal uterine and vaginal bleeding, unspecified: Secondary | ICD-10-CM

## 2023-06-04 NOTE — Progress Notes (Unsigned)
  CC: follow up Subjective:    Patient ID: Kathleen [name], female    DOB: [birth year], 57 y.o.   MRN: [id number]  HPI 57 yo G6P3 seen for follow up of cervical mass and postmenopausal bleeding.  Biopsy showed no malignancy and was c/w uterine polyp.  Discussed treatment option of exam under anesthesia with polypectomy followed by hysteroscopy D and C and resection of any residual polyp.  Pt does have multiple chronic medical issues and will need medical clearance prior to surgery.  Pt has been informed of this.   Review of Systems     Objective:   Physical Exam Vitals:   06/04/23 1048  BP: 120/70  Pulse: 93         Assessment & Plan:   1. Postmenopausal bleeding See below - Ambulatory Referral For Surgery Scheduling  2. Thickened endometrium See below - Ambulatory Referral For Surgery Scheduling  3. Abnormal uterine bleeding due to endocervical polyp Pt will be scheduled for hysteroscopy D and C and resection of uterine polyp.  She will need medical clearance prior to surgery. - Ambulatory Referral For Surgery Scheduling  I spent 20 minutes dedicated to the care of this patient including previsit review of records, face to face time with the patient discussing treatment plan and post visit testing.   Warden Fillers, MD Faculty Attending, [facility] for [facility]

## 2023-06-07 ENCOUNTER — Telehealth: Payer: Self-pay | Admitting: Family Medicine

## 2023-06-07 NOTE — Telephone Encounter (Signed)
Patient called stating that she was told by Dr.Bass to reach out to her Cardiologist regarding a procedure that needs to be done. After reaching out to them she was told that our office needs to reach out to their office to let them know why the patient will need to be put on anesthesia for this procedure.

## 2023-06-07 NOTE — Telephone Encounter (Signed)
Pt called and stated that she has an appt for 08/19/23 for clearance.  Pt advised that when she receives her clearance to please notify the [place] so that we can get her surgery scheduled.  Pt verbalized understanding with no further questions.   Addison Naegeli, RN  06/07/23

## 2023-06-24 ENCOUNTER — Other Ambulatory Visit: Payer: Self-pay | Admitting: Cardiovascular Disease

## 2023-06-24 ENCOUNTER — Other Ambulatory Visit: Payer: Self-pay | Admitting: Nurse Practitioner

## 2023-06-29 ENCOUNTER — Ambulatory Visit (INDEPENDENT_AMBULATORY_CARE_PROVIDER_SITE_OTHER): Payer: PRIVATE HEALTH INSURANCE

## 2023-06-29 ENCOUNTER — Ambulatory Visit (INDEPENDENT_AMBULATORY_CARE_PROVIDER_SITE_OTHER): Payer: PRIVATE HEALTH INSURANCE | Admitting: Vascular Surgery

## 2023-06-29 ENCOUNTER — Encounter (INDEPENDENT_AMBULATORY_CARE_PROVIDER_SITE_OTHER): Payer: Self-pay | Admitting: Vascular Surgery

## 2023-06-29 VITALS — BP 133/85 | HR 78 | Resp 18 | Ht 64.0 in | Wt 197.4 lb

## 2023-06-29 DIAGNOSIS — M79604 Pain in right leg: Secondary | ICD-10-CM | POA: Diagnosis not present

## 2023-06-29 DIAGNOSIS — I10 Essential (primary) hypertension: Secondary | ICD-10-CM

## 2023-06-29 DIAGNOSIS — I739 Peripheral vascular disease, unspecified: Secondary | ICD-10-CM

## 2023-06-29 DIAGNOSIS — E1151 Type 2 diabetes mellitus with diabetic peripheral angiopathy without gangrene: Secondary | ICD-10-CM

## 2023-06-29 NOTE — Progress Notes (Signed)
MRN : [id number]  Kathleen [name] is a 57 y.o. ([birth year]) female who presents with chief complaint of  Chief Complaint  Patient presents with   Follow-up    f/u in 1 year with abi  .  History of Present Illness: Patient returns today in follow up of leg pain and PAD.  She continues to be bothered predominantly by right knee and posterior superior calf pain.  She has seen an orthopedic surgeon previously but not recently.  This flares and intermittently occurs.  She is not having any lifestyle limiting claudication, ischemic rest pain, or ulceration.  Today, her ABIs remain normal at 1.2 on the right and 1.1 on the left and her digit pressures actually fall in the normal range today having been reduced previously.  Current Outpatient Medications  Medication Sig Dispense Refill   acetaminophen (TYLENOL) 500 MG tablet Take 2 tablets (1,000 mg total) by mouth every 6 (six) hours as needed. 30 tablet 0   aspirin EC 81 MG tablet Take 1 tablet (81 mg total) by mouth daily. Swallow whole. 30 tablet 11   Baclofen 5 MG TABS Take by mouth.     carvedilol (COREG) 25 MG tablet Take 1 tablet (25 mg total) by mouth 2 (two) times daily. 60 tablet 5   cholecalciferol (VITAMIN D3) 25 MCG (1000 UNIT) tablet Take 1,000 Units by mouth daily in the afternoon.     Collagen-Vitamin C 1000-10 MG TABS Take 1 tablet by mouth daily.     Cyanocobalamin (VITAMIN B12 PO) Take 1 tablet by mouth daily.     lidocaine (LIDODERM) 5 % APPLY ONE PATCH ONTO THE SKIN DAILY. REMOVE AND DISCARD PATCH WITHIN 12 HOURS OR AS DIRECED BY MD 30 patch 0   meloxicam (MOBIC) 7.5 MG tablet Take 7.5 mg by mouth daily.     Menthol, Topical Analgesic, (BIOFREEZE ROLL-ON EX) Apply 1 Application topically as needed (knee pain).     Misc Natural Products (OSTEO BI-FLEX/5-LOXIN ADVANCED PO) Take 2 tablets by mouth daily.     Multiple Vitamins-Minerals (ONE-A-DAY ENERGY PO) Take 1 tablet by mouth daily.     Olmesartan-amLODIPine-HCTZ 40-5-12.5  MG TABS Take 1 tablet by mouth once daily 90 tablet 0   Omega-3 Fatty Acids (FISH OIL) 1000 MG CAPS Take 1 capsule by mouth at bedtime.     ondansetron (ZOFRAN-ODT) 4 MG disintegrating tablet Take 1 tablet (4 mg total) by mouth every 8 (eight) hours as needed for nausea or vomiting. 20 tablet 0   oxyCODONE (OXY IR/ROXICODONE) 5 MG immediate release tablet Take 1 tablet (5 mg total) by mouth every 6 (six) hours as needed for moderate pain or severe pain. 60 tablet 0   rosuvastatin (CRESTOR) 5 MG tablet Take 1 tablet (5 mg total) by mouth every other day. 45 tablet 2   tiZANidine (ZANAFLEX) 4 MG tablet Take 1-2 tablets (4-8 mg total) by mouth every 6 (six) hours as needed for muscle spasms. 120 tablet 2   No current facility-administered medications for this visit.    Past Medical History:  Diagnosis Date   Hypertension    Kidney stone    Rheumatoid arthritis (HCC)     Past Surgical History:  Procedure Laterality Date   CESAREAN SECTION     3 cesarean sections     Social History   Tobacco Use   Smoking status: Every Day    Current packs/day: 0.50    Types: Cigarettes   Smokeless tobacco: Never  Vaping Use  Vaping status: Former  Substance Use Topics   Alcohol use: No   Drug use: No      Family History  Problem Relation Age of Onset   Cancer Mother        Female   Hypertension Mother      Allergies  Allergen Reactions   Bee Venom Anaphylaxis and Swelling   Penicillins Itching     REVIEW OF SYSTEMS (Negative unless checked)  Constitutional: [] Weight loss  [] Fever  [] Chills Cardiac: [] Chest pain   [] Chest pressure   [] Palpitations   [] Shortness of breath when laying flat   [] Shortness of breath at rest   [] Shortness of breath with exertion. Vascular:  [] Pain in legs with walking   [] Pain in legs at rest   [] Pain in legs when laying flat   [] Claudication   [] Pain in feet when walking  [] Pain in feet at rest  [] Pain in feet when laying flat   [] History of DVT    [] Phlebitis   [] Swelling in legs   [x] Varicose veins   [] Non-healing ulcers Pulmonary:   [] Uses home oxygen   [] Productive cough   [] Hemoptysis   [] Wheeze  [] COPD   [] Asthma Neurologic:  [] Dizziness  [] Blackouts   [] Seizures   [] History of stroke   [] History of TIA  [] Aphasia   [] Temporary blindness   [] Dysphagia   [] Weakness or numbness in arms   [] Weakness or numbness in legs Musculoskeletal:  [x] Arthritis   [] Joint swelling   [x] Joint pain   [] Low back pain Hematologic:  [] Easy bruising  [] Easy bleeding   [] Hypercoagulable state   [] Anemic   Gastrointestinal:  [] Blood in stool   [] Vomiting blood  [] Gastroesophageal reflux/heartburn   [] Abdominal pain Genitourinary:  [] Chronic kidney disease   [] Difficult urination  [] Frequent urination  [] Burning with urination   [] Hematuria Skin:  [] Rashes   [] Ulcers   [] Wounds Psychological:  [] History of anxiety   []  History of major depression.  Physical Examination  BP 133/85 (BP Location: Right Arm)   Pulse 78   Resp 18   Ht 5\' 4"  (1.626 m)   Wt 197 lb 6.4 oz (89.5 kg)   LMP 01/02/2016   BMI 33.88 kg/m  Gen:  WD/WN, NAD Head: [place]/AT, No temporalis wasting. Ear/Nose/Throat: Hearing grossly intact, nares w/o erythema or drainage Eyes: Conjunctiva clear. Sclera non-icteric Neck: Supple.  Trachea midline Pulmonary:  Good air movement, no use of accessory muscles.  Cardiac: RRR, no JVD Vascular:  Vessel Right Left  Radial Palpable Palpable                          PT Palpable Palpable  DP Palpable Palpable   Gastrointestinal: soft, non-tender/non-distended. No guarding/reflex.  Musculoskeletal: M/S 5/5 throughout.  No deformity or atrophy.  No edema. Neurologic: Sensation grossly intact in extremities.  Symmetrical.  Speech is fluent.  Psychiatric: Judgment intact, Mood & affect appropriate for pt's clinical situation. Dermatologic: No rashes or ulcers noted.  No cellulitis or open wounds.      Labs Recent Results (from the past  2160 hour(s))  HIV Antibody (routine testing w rflx)     Status: None   Collection Time: 04/01/23  8:30 AM  Result Value Ref Range   HIV Screen 4th Generation wRfx Non Reactive Non Reactive    Comment: Performed at [facility], [address], [place], [place] [address]  Basic metabolic panel     Status: Abnormal   Collection Time: 04/01/23  8:30 AM  Result Value Ref Range   Sodium 137 135 - 145 mmol/L   Potassium 3.6 3.5 - 5.1 mmol/L   Chloride 102 98 - 111 mmol/L   CO2 25 22 - 32 mmol/L   Glucose, Bld 130 (H) 70 - 99 mg/dL    Comment: Glucose reference range applies only to samples taken after fasting for at least 8 hours.   BUN 10 6 - 20 mg/dL   Creatinine, Ser 6.21 0.44 - 1.00 mg/dL   Calcium 30.8 (H) 8.9 - 10.3 mg/dL   GFR, Estimated >65 >78 mL/min    Comment: (NOTE) Calculated using the CKD-EPI Creatinine Equation (2021)    Anion gap 10 5 - 15    Comment: Performed at [facility], [address] [place], [place] [address]  CBC     Status: None   Collection Time: 04/01/23  8:30 AM  Result Value Ref Range   WBC 6.6 4.0 - 10.5 K/uL   RBC 4.48 3.87 - 5.11 MIL/uL   Hemoglobin 13.2 12.0 - 15.0 g/dL   HCT 95.2 84.1 - 32.4 %   MCV 90.4 80.0 - 100.0 fL   MCH 29.5 26.0 - 34.0 pg   MCHC 32.6 30.0 - 36.0 g/dL   RDW 40.1 02.7 - 25.3 %   Platelets 302 150 - 400 K/uL   nRBC 0.0 0.0 - 0.2 %    Comment: Performed at [facility], [address], [place], [place] [address]  Surgical pathology( [facility])     Status: None   Collection Time: 04/21/23  2:51 PM  Result Value Ref Range   SURGICAL PATHOLOGY      SURGICAL PATHOLOGY CASE: [id number] PATIENT: Britteney Toda Surgical Pathology Report     Clinical History: PMB, thickened endometrial stripe on u/s (cm)     FINAL MICROSCOPIC DIAGNOSIS:  A. ENDOMETRIUM, POLYPECTOMY: -  Benign endometrial polyp, negative for atypia/hyperplasia.    GROSS DESCRIPTION:  Received in formalin are 4  fragments of tan soft tissue ranging from 0.2 to 0.5 cm.  The specimen is entirely submitted in 1 block. (KW, 04/22/2023)  Final Diagnosis performed by Orene Desanctis DO.   Electronically signed 04/23/2023 Technical component performed at [employer], [address] [place], [place] [address].  Professional component performed at [facility], [address] [place], [place] [address].  Immunohistochemistry Technical component (if applicable) was performed at [facility]. [address], [place], [place] [address].   IMMUNOHISTOCHEMISTRY DISCLAIMER (if applicable):  Some of these immunohistochemical stains may have been developed and the performance characteristics determine by [facility]. Some may not have been cleared or approved by the U.S. Food and Drug Administration. The FDA has determined that such clearance or approval is not necessary. This test is used for clinical purposes. It should not be regarded as investigational or for research. This laboratory is certified under the Clinical Laboratory Improvement Amendments of 1988 (CLIA-88) as qualified to perform high complexity clinical laboratory testing.  The controls stained appropriately.   IHC stains are performed on formalin fixed, paraffin embedded tissue using a 3,3"diaminobenzidine (DAB) chromogen and Leica Bond Autostainer System. The staining intensity of the nucleus is score manually and is reported as the percentage of tumor cell nuclei demonstrating specific nuclear staining. The specimens are fixed in 10% Neutral Formalin for at least 6 hours and u p to 72hrs. These tests are validated on decalcified tissue. Results  should be interpreted with caution given the possibility of false negative results on decalcified specimens. Antibody Clones are as follows ER-clone 24F, PR-clone 16, Ki67- clone MM1. Some of these immunohistochemical stains may  have been developed and the performance characteristics determined by [facility].     Radiology No results found.  Assessment/Plan  Type 2 diabetes mellitus with diabetic peripheral angiopathy without gangrene, without long-term current use of insulin (HCC) blood glucose control important in reducing the progression of atherosclerotic disease. Also, involved in wound healing. On appropriate medications.   Essential hypertension, benign blood pressure control important in reducing the progression of atherosclerotic disease. On appropriate oral medications.   PAD (peripheral artery disease) (HCC) Today, her ABIs remain normal at 1.2 on the right and 1.1 on the left and her digit pressures actually fall in the normal range today having been reduced previously.  Arterial insufficiency is not likely the cause of her lower extremity pain.  We can follow this on an annual basis with her extensive atherosclerotic risk factors.  Continue Crestor and aspirin.  Pain in limb Not likely vascular in nature.  She says if this gets worse, she will follow-up with orthopedics.    Festus Barren, MD  06/29/2023 9:51 AM    This note was created with Dragon medical transcription system.  Any errors from dictation are purely unintentional

## 2023-06-29 NOTE — Assessment & Plan Note (Signed)
blood pressure control important in reducing the progression of atherosclerotic disease. On appropriate oral medications.  

## 2023-06-29 NOTE — Assessment & Plan Note (Signed)
blood glucose control important in reducing the progression of atherosclerotic disease. Also, involved in wound healing. On appropriate medications.  

## 2023-06-29 NOTE — Assessment & Plan Note (Signed)
Today, her ABIs remain normal at 1.2 on the right and 1.1 on the left and her digit pressures actually fall in the normal range today having been reduced previously.  Arterial insufficiency is not likely the cause of her lower extremity pain.  We can follow this on an annual basis with her extensive atherosclerotic risk factors.  Continue Crestor and aspirin.

## 2023-06-29 NOTE — Assessment & Plan Note (Signed)
Not likely vascular in nature.  She says if this gets worse, she will follow-up with orthopedics.

## 2023-07-01 ENCOUNTER — Telehealth: Payer: Self-pay

## 2023-07-01 NOTE — Telephone Encounter (Signed)
Patient was notified and given to [facility] to make an sooner appointment.

## 2023-07-05 ENCOUNTER — Ambulatory Visit (INDEPENDENT_AMBULATORY_CARE_PROVIDER_SITE_OTHER): Payer: 59 | Admitting: Nurse Practitioner

## 2023-07-05 ENCOUNTER — Encounter: Payer: Self-pay | Admitting: Nurse Practitioner

## 2023-07-05 VITALS — BP 113/76 | HR 84 | Temp 97.0°F | Resp 16 | Ht 64.0 in | Wt 198.6 lb

## 2023-07-05 DIAGNOSIS — I10 Essential (primary) hypertension: Secondary | ICD-10-CM | POA: Diagnosis not present

## 2023-07-05 DIAGNOSIS — R109 Unspecified abdominal pain: Secondary | ICD-10-CM

## 2023-07-05 DIAGNOSIS — M25561 Pain in right knee: Secondary | ICD-10-CM | POA: Diagnosis not present

## 2023-07-05 DIAGNOSIS — E1151 Type 2 diabetes mellitus with diabetic peripheral angiopathy without gangrene: Secondary | ICD-10-CM | POA: Diagnosis not present

## 2023-07-05 LAB — POCT GLYCOSYLATED HEMOGLOBIN (HGB A1C): Hemoglobin A1C: 6.2 % — AB (ref 4.0–5.6)

## 2023-07-05 LAB — VAS US ABI WITH/WO TBI
Left ABI: 1.13
Right ABI: 1.24

## 2023-07-05 MED ORDER — TIZANIDINE HCL 4 MG PO TABS: 4.0000 mg | ORAL_TABLET | Freq: Four times a day (QID) | ORAL | 2 refills | Status: DC | PRN

## 2023-07-05 MED ORDER — OXYCODONE HCL 5 MG PO TABS: 5.0000 mg | ORAL_TABLET | Freq: Two times a day (BID) | ORAL | 0 refills | Status: DC | PRN

## 2023-07-05 MED ORDER — LIDOCAINE 5 % EX PTCH: 1.0000 | MEDICATED_PATCH | CUTANEOUS | 2 refills | Status: AC

## 2023-07-05 MED ORDER — METHYLPREDNISOLONE ACETATE 80 MG/ML IJ SUSP
80.0000 mg | Freq: Once | INTRAMUSCULAR | Status: AC
  Administered 2023-07-05: 40 mg via INTRAMUSCULAR

## 2023-07-05 NOTE — Progress Notes (Signed)
[facility] [address] [place], [place] [address]  Internal MEDICINE  Office Visit Note  Patient Name: Kathleen [name]  [id number]  [id number]  Date of Service: 07/05/2023  Chief Complaint  Patient presents with   Follow-up    Medication refills     HPI Corinne presents for a follow-up visit for diabetes, hypertension, right knee pain, and endocervical polyp.  Diabetes -- A1c improved to 6.2 today. Diet controlled. No medications at this time.  Hypertension -- BP controlled with current medications.  Acute pain of right knee -- swells, possible pain that is radiating from low back and sciatica, grade 1 anterolisthesis of lumbar spine.  Endocervical polyp -- per her OBGYN, this is where are flank pain and vaginal bleeding is coming from.     Current Medication: Outpatient Encounter Medications as of 07/05/2023  Medication Sig   acetaminophen (TYLENOL) 500 MG tablet Take 2 tablets (1,000 mg total) by mouth every 6 (six) hours as needed.   aspirin EC 81 MG tablet Take 1 tablet (81 mg total) by mouth daily. Swallow whole.   Baclofen 5 MG TABS Take by mouth.   carvedilol (COREG) 25 MG tablet Take 1 tablet (25 mg total) by mouth 2 (two) times daily.   cholecalciferol (VITAMIN D3) 25 MCG (1000 UNIT) tablet Take 1,000 Units by mouth daily in the afternoon.   Collagen-Vitamin C 1000-10 MG TABS Take 1 tablet by mouth daily.   Cyanocobalamin (VITAMIN B12 PO) Take 1 tablet by mouth daily.   Menthol, Topical Analgesic, (BIOFREEZE ROLL-ON EX) Apply 1 Application topically as needed (knee pain).   Misc Natural Products (OSTEO BI-FLEX/5-LOXIN ADVANCED PO) Take 2 tablets by mouth daily.   Multiple Vitamins-Minerals (ONE-A-DAY ENERGY PO) Take 1 tablet by mouth daily.   Olmesartan-amLODIPine-HCTZ 40-5-12.5 MG TABS Take 1 tablet by mouth once daily   Omega-3 Fatty Acids (FISH OIL) 1000 MG CAPS Take 1 capsule by mouth at bedtime.   ondansetron (ZOFRAN-ODT) 4 MG disintegrating  tablet Take 1 tablet (4 mg total) by mouth every 8 (eight) hours as needed for nausea or vomiting.   rosuvastatin (CRESTOR) 5 MG tablet Take 1 tablet (5 mg total) by mouth every other day.   [DISCONTINUED] lidocaine (LIDODERM) 5 % APPLY ONE PATCH ONTO THE SKIN DAILY. REMOVE AND DISCARD PATCH WITHIN 12 HOURS OR AS DIRECED BY MD   [DISCONTINUED] meloxicam (MOBIC) 7.5 MG tablet Take 7.5 mg by mouth daily.   [DISCONTINUED] oxyCODONE (OXY IR/ROXICODONE) 5 MG immediate release tablet Take 1 tablet (5 mg total) by mouth every 6 (six) hours as needed for moderate pain or severe pain.   [DISCONTINUED] tiZANidine (ZANAFLEX) 4 MG tablet Take 1-2 tablets (4-8 mg total) by mouth every 6 (six) hours as needed for muscle spasms.   lidocaine (LIDODERM) 5 % Place 1 patch onto the skin daily. To affected area. Remove & Discard patch within 12 hours or as directed by provider.   oxyCODONE (OXY IR/ROXICODONE) 5 MG immediate release tablet Take 1 tablet (5 mg total) by mouth 2 (two) times daily as needed for moderate pain (pain score 4-6) or severe pain (pain score 7-10).   [START ON 08/02/2023] oxyCODONE (OXY IR/ROXICODONE) 5 MG immediate release tablet Take 1 tablet (5 mg total) by mouth 2 (two) times daily as needed for moderate pain (pain score 4-6) or severe pain (pain score 7-10).   tiZANidine (ZANAFLEX) 4 MG tablet Take 1-2 tablets (4-8 mg total) by mouth every 6 (six) hours as needed for muscle  spasms.   Facility-Administered Encounter Medications as of 07/05/2023  Medication   methylPREDNISolone acetate (DEPO-MEDROL) injection 80 mg    Surgical History: Past Surgical History:  Procedure Laterality Date   CESAREAN SECTION     3 cesarean sections    Medical History: Past Medical History:  Diagnosis Date   Hypertension    Kidney stone    Rheumatoid arthritis (HCC)     Family History: Family History  Problem Relation Age of Onset   Cancer Mother        Female   Hypertension Mother     Social  History   Socioeconomic History   Marital status: Married    Spouse name: Not on file   Number of children: Not on file   Years of education: Not on file   Highest education level: Not on file  Occupational History   Not on file  Tobacco Use   Smoking status: Every Day    Current packs/day: 0.50    Types: Cigarettes   Smokeless tobacco: Never  Vaping Use   Vaping status: Former  Substance and Sexual Activity   Alcohol use: No   Drug use: No   Sexual activity: Yes    Birth control/protection: None  Other Topics Concern   Not on file  Social History Narrative   Not on file   Social Drivers of Health   Financial Resource Strain: Not on file  Food Insecurity: No Food Insecurity (04/01/2023)   Hunger Vital Sign    Worried About Running Out of Food in the Last Year: Never true    Ran Out of Food in the Last Year: Never true  Transportation Needs: No Transportation Needs (04/01/2023)   PRAPARE - Administrator, Civil Service (Medical): No    Lack of Transportation (Non-Medical): No  Physical Activity: Not on file  Stress: Not on file  Social Connections: Not on file  Intimate Partner Violence: Not At Risk (04/01/2023)   Humiliation, Afraid, Rape, and Kick questionnaire    Fear of Current or Ex-Partner: No    Emotionally Abused: No    Physically Abused: No    Sexually Abused: No      Review of Systems  Constitutional:  Positive for appetite change. Negative for chills, fatigue and unexpected weight change.  HENT:  Negative for congestion, postnasal drip, rhinorrhea, sneezing and sore throat.   Respiratory: Negative.  Negative for cough, chest tightness, shortness of breath and wheezing.   Cardiovascular: Negative.  Negative for chest pain and palpitations.  Gastrointestinal: Negative.  Negative for abdominal pain, constipation, diarrhea, nausea and vomiting.  Genitourinary:  Positive for dysuria, flank pain, frequency and urgency. Negative for hematuria.   Musculoskeletal:  Positive for arthralgias, back pain and myalgias. Negative for joint swelling and neck pain.  Skin:  Negative for rash.  Neurological: Negative.  Negative for tremors, numbness and headaches.  Psychiatric/Behavioral:  Negative for behavioral problems (Depression), sleep disturbance and suicidal ideas. The patient is not nervous/anxious.     Vital Signs: BP 113/76   Pulse 84   Temp (!) 97 F (36.1 C)   Resp 16   Ht 5\' 4"  (1.626 m)   Wt 198 lb 9.6 oz (90.1 kg)   LMP 01/02/2016   SpO2 97%   BMI 34.09 kg/m    Physical Exam Vitals reviewed.  Constitutional:      General: She is not in acute distress.    Appearance: Normal appearance. She is obese. She is not ill-appearing.  HENT:     Head: Normocephalic and atraumatic.  Eyes:     Pupils: Pupils are equal, round, and reactive to light.  Cardiovascular:     Rate and Rhythm: Normal rate and regular rhythm.     Heart sounds: Normal heart sounds. No murmur heard. Pulmonary:     Effort: Pulmonary effort is normal. No respiratory distress.     Breath sounds: Normal breath sounds. No wheezing.  Abdominal:     Tenderness: There is no abdominal tenderness. There is no right CVA tenderness or guarding.  Neurological:     Mental Status: She is alert and oriented to person, place, and time.  Psychiatric:        Mood and Affect: Mood normal.        Behavior: Behavior normal.        Assessment/Plan: 1. Acute pain of right knee (Primary) Depo medrol injection administered in office today. Continue prn use of oxycodone, tizanidine and lidoderm patches as prescribed.  - oxyCODONE (OXY IR/ROXICODONE) 5 MG immediate release tablet; Take 1 tablet (5 mg total) by mouth 2 (two) times daily as needed for moderate pain (pain score 4-6) or severe pain (pain score 7-10).  Dispense: 60 tablet; Refill: 0 - oxyCODONE (OXY IR/ROXICODONE) 5 MG immediate release tablet; Take 1 tablet (5 mg total) by mouth 2 (two) times daily as  needed for moderate pain (pain score 4-6) or severe pain (pain score 7-10).  Dispense: 60 tablet; Refill: 0 - tiZANidine (ZANAFLEX) 4 MG tablet; Take 1-2 tablets (4-8 mg total) by mouth every 6 (six) hours as needed for muscle spasms.  Dispense: 120 tablet; Refill: 2 - lidocaine (LIDODERM) 5 %; Place 1 patch onto the skin daily. To affected area. Remove & Discard patch within 12 hours or as directed by provider.  Dispense: 30 patch; Refill: 2 - methylPREDNISolone acetate (DEPO-MEDROL) injection 80 mg  2. Acute right flank pain Continue prn use of oxycodone, tizanidine and lidoderm patches as prescribed.  - oxyCODONE (OXY IR/ROXICODONE) 5 MG immediate release tablet; Take 1 tablet (5 mg total) by mouth 2 (two) times daily as needed for moderate pain (pain score 4-6) or severe pain (pain score 7-10).  Dispense: 60 tablet; Refill: 0 - oxyCODONE (OXY IR/ROXICODONE) 5 MG immediate release tablet; Take 1 tablet (5 mg total) by mouth 2 (two) times daily as needed for moderate pain (pain score 4-6) or severe pain (pain score 7-10).  Dispense: 60 tablet; Refill: 0 - tiZANidine (ZANAFLEX) 4 MG tablet; Take 1-2 tablets (4-8 mg total) by mouth every 6 (six) hours as needed for muscle spasms.  Dispense: 120 tablet; Refill: 2 - lidocaine (LIDODERM) 5 %; Place 1 patch onto the skin daily. To affected area. Remove & Discard patch within 12 hours or as directed by provider.  Dispense: 30 patch; Refill: 2  3. Type 2 diabetes mellitus with diabetic peripheral angiopathy without gangrene, without long-term current use of insulin (HCC) Stable, A1c is 6.2 now. Continue diet and lifestyle modifications as discussed.  - POCT glycosylated hemoglobin (Hb A1C)  4. Essential hypertension, benign Stable, continue medications as prescribed.    General Counseling: keiarra pendry understanding of the findings of todays visit and agrees with plan of treatment. I have discussed any further diagnostic evaluation that may be  needed or ordered today. We also reviewed her medications today. she has been encouraged to call the office with any questions or concerns that should arise related to todays visit.    Orders Placed This Encounter  Procedures   POCT glycosylated hemoglobin (Hb A1C)    Meds ordered this encounter  Medications   oxyCODONE (OXY IR/ROXICODONE) 5 MG immediate release tablet    Sig: Take 1 tablet (5 mg total) by mouth 2 (two) times daily as needed for moderate pain (pain score 4-6) or severe pain (pain score 7-10).    Dispense:  60 tablet    Refill:  0    Refill of previous oxycodone prescription   oxyCODONE (OXY IR/ROXICODONE) 5 MG immediate release tablet    Sig: Take 1 tablet (5 mg total) by mouth 2 (two) times daily as needed for moderate pain (pain score 4-6) or severe pain (pain score 7-10).    Dispense:  60 tablet    Refill:  0    Refill of previous oxycodone prescription, for January   tiZANidine (ZANAFLEX) 4 MG tablet    Sig: Take 1-2 tablets (4-8 mg total) by mouth every 6 (six) hours as needed for muscle spasms.    Dispense:  120 tablet    Refill:  2    Fill new script today, discontinue baclofen   lidocaine (LIDODERM) 5 %    Sig: Place 1 patch onto the skin daily. To affected area. Remove & Discard patch within 12 hours or as directed by provider.    Dispense:  30 patch    Refill:  2   methylPREDNISolone acetate (DEPO-MEDROL) injection 80 mg    Return in about 8 weeks (around 08/30/2023) for F/U, Tya Haughey PCP.   Total time spent:30 Minutes Time spent includes review of chart, medications, test results, and follow up plan with the patient.   [place] Controlled Substance Database was reviewed by me.  This patient was seen by [name], FNP-C in collaboration with Dr. Beverely Risen as a part of collaborative care agreement.   Talyn Eddie R. Tedd Sias, MSN, FNP-C Internal medicine

## 2023-07-27 ENCOUNTER — Telehealth: Payer: Self-pay

## 2023-07-27 NOTE — Telephone Encounter (Signed)
 Called patient to schedule surgery w/ Dr. Donavan Foil on 09/14/23. Left voicemail requesting a call back to schedule [phone].

## 2023-07-28 ENCOUNTER — Telehealth: Payer: Self-pay

## 2023-07-28 NOTE — Telephone Encounter (Signed)
 Patient called me back to schedule procedure w/ Dr. Donavan Foil on 09/13/22 at [facility] at 2 pm. Patient confirmed her arrival time is 12 pm. Pre-op instructions and surgery details were provided by phone. Patient prefers written details be sent to her Mychart.

## 2023-07-30 ENCOUNTER — Other Ambulatory Visit: Payer: Self-pay | Admitting: Nurse Practitioner

## 2023-08-04 ENCOUNTER — Telehealth: Payer: Self-pay | Admitting: Obstetrics and Gynecology

## 2023-08-04 ENCOUNTER — Telehealth: Payer: Self-pay

## 2023-08-04 NOTE — Telephone Encounter (Signed)
 PA was denied for Lidocaine  patches. Per Alyssa she can get them over the counter, Patient was notified.

## 2023-08-04 NOTE — Telephone Encounter (Signed)
-----   Message from Abigail Abler sent at 07/29/2023 12:56 PM EST ----- Regarding: preop Please schedule patient for preop visit 1-2 weeks before her procedure on 09/14/23

## 2023-08-19 ENCOUNTER — Ambulatory Visit: Payer: Medicaid Other | Attending: Cardiovascular Disease | Admitting: Cardiovascular Disease

## 2023-08-19 ENCOUNTER — Encounter: Payer: Self-pay | Admitting: Cardiovascular Disease

## 2023-08-19 VITALS — BP 140/90 | HR 79 | Ht 64.0 in | Wt 198.5 lb

## 2023-08-19 DIAGNOSIS — E785 Hyperlipidemia, unspecified: Secondary | ICD-10-CM | POA: Diagnosis not present

## 2023-08-19 DIAGNOSIS — Z720 Tobacco use: Secondary | ICD-10-CM | POA: Diagnosis not present

## 2023-08-19 DIAGNOSIS — I10 Essential (primary) hypertension: Secondary | ICD-10-CM

## 2023-08-19 DIAGNOSIS — Z01810 Encounter for preprocedural cardiovascular examination: Secondary | ICD-10-CM

## 2023-08-19 DIAGNOSIS — I119 Hypertensive heart disease without heart failure: Secondary | ICD-10-CM

## 2023-08-19 NOTE — Patient Instructions (Addendum)
Medication Instructions:  - Your physician recommends that you continue on your current medications as directed. Please refer to the Current Medication list given to you today.  *If you need a refill on your cardiac medications before your next appointment, please call your pharmacy*   Lab Work: - none ordered  If you have labs (blood work) drawn today and your tests are completely normal, you will receive your results only by: MyChart Message (if you have MyChart) OR A paper copy in the mail If you have any lab test that is abnormal or we need to change your treatment, we will call you to review the results.   Testing/Procedures: - none ordered   Follow-Up: At [facility], you and your health needs are our priority.  As part of our continuing mission to provide you with exceptional heart care, we have created designated Provider Care Teams.  These Care Teams include your primary Cardiologist (physician) and Advanced Practice Providers (APPs -  Physician Assistants and Nurse Practitioners) who all work together to provide you with the care you need, when you need it.  We recommend signing up for the patient portal called "MyChart".  Sign up information is provided on this After Visit Summary.  MyChart is used to connect with patients for Virtual Visits (Telemedicine).  Patients are able to view lab/test results, encounter notes, upcoming appointments, etc.  Non-urgent messages can be sent to your provider as well.   To learn more about what you can do with MyChart, go to [email or web address].    Your next appointment:   1 year(s)  Provider:   You may see [name], MD or one of the following Advanced Practice Providers on your designated Care Team:   Nicolasa Ducking, NP Eula Listen, PA-C Cadence Fransico Michael, PA-C Charlsie Quest, NP Carlos Levering, NP    Other Instructions N/a

## 2023-08-19 NOTE — Progress Notes (Signed)
Cardiology Office Note   Date:  08/19/2023   ID:  Kathleen [name], Kathleen [name] May 17, 1966, MRN [id number]  PCP:  [name], NP  Cardiologist:   [name], MD   Chief Complaint  Patient presents with   6 month follow up     Cardiac clearance for DILATATION AND CURETTAGE /HYSTEROSCOPY AND POLYP RESECTION; scheduled for Feb. 25, 2025. Patient c/o right leg pain with walking.       History of Present Illness: Kathleen [name] is a 58 y.o. female who is here today for follow-up visit regarding hypertensive heart disease.   She has known history of essential hypertension, hyperlipidemia, chronic back pain, sleep apnea not on CPAP, tobacco use and obesity. She has difficult to control hypertension and her blood pressure was extremely high in early 2023.   She had an echocardiogram done in January of 2023 which showed normal LV systolic function with mild left ventricular hypertrophy and impaired relaxation.   Her blood pressure became controlled with antihypertensive medications and she reports significant improvement in symptoms.  During last visit, I decreased the dose of hydrochlorothiazide to 12.5 mg once daily.  She has been doing well with no chest pain, shortness of breath or palpitations.  Unfortunately, she was laid off work. She does not use CPAP for sleep apnea. She is scheduled to have D&C next month.  She is able to do activities of daily living with no significant limitations.   Past Medical History:  Diagnosis Date   Hypertension    Kidney stone    Rheumatoid arthritis (HCC)     Past Surgical History:  Procedure Laterality Date   CESAREAN SECTION     3 cesarean sections     Current Outpatient Medications  Medication Sig Dispense Refill   acetaminophen (TYLENOL) 500 MG tablet Take 2 tablets (1,000 mg total) by mouth every 6 (six) hours as needed. 30 tablet 0   aspirin EC 81 MG tablet Take 1 tablet (81 mg total) by mouth daily. Swallow whole. 30 tablet 11    Baclofen 5 MG TABS Take by mouth.     carvedilol (COREG) 25 MG tablet Take 1 tablet by mouth twice daily 60 tablet 5   cholecalciferol (VITAMIN D3) 25 MCG (1000 UNIT) tablet Take 1,000 Units by mouth daily in the afternoon.     Collagen-Vitamin C 1000-10 MG TABS Take 1 tablet by mouth daily.     Cyanocobalamin (VITAMIN B12 PO) Take 1 tablet by mouth daily.     lidocaine (LIDODERM) 5 % Place 1 patch onto the skin daily. To affected area. Remove & Discard patch within 12 hours or as directed by provider. 30 patch 2   Menthol, Topical Analgesic, (BIOFREEZE ROLL-ON EX) Apply 1 Application topically as needed (knee pain).     Misc Natural Products (OSTEO BI-FLEX/5-LOXIN ADVANCED PO) Take 2 tablets by mouth daily.     Multiple Vitamins-Minerals (ONE-A-DAY ENERGY PO) Take 1 tablet by mouth daily.     Olmesartan-amLODIPine-HCTZ 40-5-12.5 MG TABS Take 1 tablet by mouth once daily 90 tablet 0   Omega-3 Fatty Acids (FISH OIL) 1000 MG CAPS Take 1 capsule by mouth at bedtime.     ondansetron (ZOFRAN-ODT) 4 MG disintegrating tablet Take 1 tablet (4 mg total) by mouth every 8 (eight) hours as needed for nausea or vomiting. 20 tablet 0   oxyCODONE (OXY IR/ROXICODONE) 5 MG immediate release tablet Take 1 tablet (5 mg total) by mouth 2 (two) times daily as needed for  moderate pain (pain score 4-6) or severe pain (pain score 7-10). 60 tablet 0   rosuvastatin (CRESTOR) 5 MG tablet Take 1 tablet (5 mg total) by mouth every other day. 45 tablet 2   tiZANidine (ZANAFLEX) 4 MG tablet Take 1-2 tablets (4-8 mg total) by mouth every 6 (six) hours as needed for muscle spasms. 120 tablet 2   oxyCODONE (OXY IR/ROXICODONE) 5 MG immediate release tablet Take 1 tablet (5 mg total) by mouth 2 (two) times daily as needed for moderate pain (pain score 4-6) or severe pain (pain score 7-10). (Patient not taking: Reported on 08/19/2023) 60 tablet 0   No current facility-administered medications for this visit.    Allergies:   Bee  venom and Penicillins    Social History:  The patient  reports that she has been smoking cigarettes. She has never used smokeless tobacco. She reports that she does not drink alcohol and does not use drugs.   Family History:  The patient's family history includes Cancer in her mother; Hypertension in her mother.    ROS:  Please see the history of present illness.   Otherwise, review of systems are positive for none.   All other systems are reviewed and negative.    PHYSICAL EXAM: VS:  BP (!) 140/90 (BP Location: Left Arm, Patient Position: Sitting, Cuff Size: Large)   Pulse 79   Ht 5\' 4"  (1.626 m)   Wt 198 lb 8 oz (90 kg)   LMP 01/02/2016   SpO2 95%   BMI 34.07 kg/m  , BMI Body mass index is 34.07 kg/m. GEN: Well nourished, well developed, in no acute distress  HEENT: normal  Neck: no JVD, carotid bruits, or masses Cardiac: RRR; no murmurs, rubs, or gallops,no edema  Respiratory:  clear to auscultation bilaterally, normal work of breathing GI: soft, nontender, nondistended, + BS MS: no deformity or atrophy  Skin: warm and dry, no rash Neuro:  Strength and sensation are intact Psych: euthymic mood, full affect   EKG:  EKG is ordered today. The ekg ordered today demonstrates : Normal sinus rhythm Normal ECG       Recent Labs: 03/31/2023: ALT 18 04/01/2023: BUN 10; Creatinine, Ser 0.97; Hemoglobin 13.2; Platelets 302; Potassium 3.6; Sodium 137    Lipid Panel    Component Value Date/Time   CHOL 179 09/15/2022 1035   TRIG 105 09/15/2022 1035   HDL 58 09/15/2022 1035   CHOLHDL 3.1 09/15/2022 1035   CHOLHDL 4.0 04/13/2014 1259   VLDL 17 04/13/2014 1259   LDLCALC 102 (H) 09/15/2022 1035      Wt Readings from Last 3 Encounters:  08/19/23 198 lb 8 oz (90 kg)  07/05/23 198 lb 9.6 oz (90.1 kg)  06/29/23 197 lb 6.4 oz (89.5 kg)          05/21/2022    9:56 AM  PAD Screen  Previous PAD dx? No  Previous surgical procedure? Yes  Pain with walking? No  Feet/toe  relief with dangling? No  Painful, non-healing ulcers? No  Extremities discolored? No      ASSESSMENT AND PLAN:  1.  Hypertensive heart disease without heart failure: She is doing well overall with no evidence of heart failure.  Continue antihypertensive medications.  I discussed the importance of healthy lifestyle changes.  2.  Preop cardiovascular evaluation for D&C.  She has no cardiac symptoms at the present time, her functional capacity is greater than 4 METS and her EKG is normal.  Thus, she does  not require any ischemic cardiac evaluation before surgery.  She is at low risk for cardiovascular complications.  3.  Essential hypertension: Blood pressure is controlled on current medications which will be continued.  4.  Hyperlipidemia: Continue small dose rosuvastatin.  5.  Tobacco use: She cut down on tobacco use but has not been able to quit completely.  I discussed the importance of smoking cessation.    Disposition:   FU with me in 12 months  Signed,  [name], MD  08/19/2023 9:57 AM    [facility]

## 2023-08-23 ENCOUNTER — Encounter: Payer: Self-pay | Admitting: Obstetrics and Gynecology

## 2023-08-23 ENCOUNTER — Ambulatory Visit: Payer: Medicaid Other | Admitting: Obstetrics and Gynecology

## 2023-08-23 ENCOUNTER — Other Ambulatory Visit: Payer: Self-pay

## 2023-08-23 VITALS — BP 163/104 | HR 84 | Ht 64.0 in | Wt 196.4 lb

## 2023-08-23 DIAGNOSIS — Z01818 Encounter for other preprocedural examination: Secondary | ICD-10-CM | POA: Diagnosis not present

## 2023-08-23 DIAGNOSIS — N950 Postmenopausal bleeding: Secondary | ICD-10-CM | POA: Diagnosis not present

## 2023-08-23 MED ORDER — MISOPROSTOL 100 MCG PO TABS: ORAL_TABLET | ORAL | 0 refills | Status: DC

## 2023-08-23 NOTE — Progress Notes (Signed)
 Marland Kitchen

## 2023-08-23 NOTE — Progress Notes (Signed)
OB/GYN Pre-Op History and Physical  Kathleen [name] is a 58 y.o. [id number] presenting for preoperative visit for postmenopausal bleeding.  Pt has been cleared by her PCP .  Pt notes her bleeding has diminished since last exam.  Discussed risks and benefits of the procedure including bleeding, infection, involvement of other organs as well as uterine perforation.      Past Medical History:  Diagnosis Date   Hypertension    Kidney stone    Rheumatoid arthritis (HCC)     Past Surgical History:  Procedure Laterality Date   CESAREAN SECTION     3 cesarean sections    OB History  Gravida Para Term Preterm AB Living  6 3 3  0 3 3  SAB IAB Ectopic Multiple Live Births  3 0 0 0     # Outcome Date GA Lbr Len/2nd Weight Sex Type Anes PTL Lv  6 SAB           5 SAB           4 SAB           3 Term      CS-Unspec     2 Term      CS-Unspec     1 Term      CS-Unspec       Social History   Socioeconomic History   Marital status: Married    Spouse name: Not on file   Number of children: Not on file   Years of education: Not on file   Highest education level: Not on file  Occupational History   Not on file  Tobacco Use   Smoking status: Every Day    Current packs/day: 0.50    Types: Cigarettes   Smokeless tobacco: Never  Vaping Use   Vaping status: Former  Substance and Sexual Activity   Alcohol use: No   Drug use: No   Sexual activity: Yes    Birth control/protection: None  Other Topics Concern   Not on file  Social History Narrative   Not on file   Social Drivers of Health   Financial Resource Strain: Not on file  Food Insecurity: No Food Insecurity (04/01/2023)   Hunger Vital Sign    Worried About Running Out of Food in the Last Year: Never true    Ran Out of Food in the Last Year: Never true  Transportation Needs: No Transportation Needs (04/01/2023)   PRAPARE - Administrator, Civil Service (Medical): No    Lack of Transportation (Non-Medical): No   Physical Activity: Not on file  Stress: Not on file  Social Connections: Not on file    Family History  Problem Relation Age of Onset   Cancer Mother        Female   Hypertension Mother     (Not in a hospital admission)   Allergies  Allergen Reactions   Bee Venom Anaphylaxis and Swelling   Penicillins Itching    Review of Systems: Negative except for what is mentioned in HPI.     Physical Exam: BP (!) 163/104   Pulse 84   Ht 5\' 4"  (1.626 m)   Wt 196 lb 6.4 oz (89.1 kg)   LMP 01/02/2016   BMI 33.71 kg/m  CONSTITUTIONAL: Well-developed, well-nourished female in no acute distress.  HENT:  Normocephalic, atraumatic, External right and left ear normal. Oropharynx is clear and moist EYES: Conjunctivae and EOM are normal.   NECK: Normal range of motion,  supple, no masses SKIN: Skin is warm and dry. No rash noted. Not diaphoretic. No erythema. No pallor. NEUROLGIC: Alert and oriented to person, place, and time. Normal reflexes, muscle tone coordination. No cranial nerve deficit noted. PSYCHIATRIC: Normal mood and affect. Normal behavior. Normal judgment and thought content. CARDIOVASCULAR: Normal heart rate noted, regular rhythm RESPIRATORY: Effort and breath sounds normal, no problems with respiration noted ABDOMEN: Soft, nontender, nondistended, large well healed midline scar noted PELVIC: SSE: large polyp that was photographed previously was not present today.  Cervix visualized with no dilation and no obvious polyp MUSCULOSKELETAL: Normal range of motion. No edema and no tenderness. 2+ distal pulses.   Pertinent Labs/Studies:   No results found for this or any previous visit (from the past 72 hours).     Assessment and Plan :Kathleen [name] is a 58 y.o. [id number] here for preop visit.   Plan for hysteroscopy dilation and curettage with possible polyp resection NPO Admission labs ordered VS Q4 Surgical clearance previously received.   Mariel Aloe,  M.D. Attending Obstetrician & Gynecologist, [facility] for [employer], [facility]

## 2023-08-23 NOTE — H&P (View-Only) (Signed)
 OB/GYN Pre-Op History and Physical  Kathleen [name] is a 58 y.o. [id number] presenting for preoperative visit for postmenopausal bleeding.  Pt has been cleared by her PCP .  Pt notes her bleeding has diminished since last exam.  Discussed risks and benefits of the procedure including bleeding, infection, involvement of other organs as well as uterine perforation.      Past Medical History:  Diagnosis Date   Hypertension    Kidney stone    Rheumatoid arthritis (HCC)     Past Surgical History:  Procedure Laterality Date   CESAREAN SECTION     3 cesarean sections    OB History  Gravida Para Term Preterm AB Living  6 3 3  0 3 3  SAB IAB Ectopic Multiple Live Births  3 0 0 0     # Outcome Date GA Lbr Len/2nd Weight Sex Type Anes PTL Lv  6 SAB           5 SAB           4 SAB           3 Term      CS-Unspec     2 Term      CS-Unspec     1 Term      CS-Unspec       Social History   Socioeconomic History   Marital status: Married    Spouse name: Not on file   Number of children: Not on file   Years of education: Not on file   Highest education level: Not on file  Occupational History   Not on file  Tobacco Use   Smoking status: Every Day    Current packs/day: 0.50    Types: Cigarettes   Smokeless tobacco: Never  Vaping Use   Vaping status: Former  Substance and Sexual Activity   Alcohol use: No   Drug use: No   Sexual activity: Yes    Birth control/protection: None  Other Topics Concern   Not on file  Social History Narrative   Not on file   Social Drivers of Health   Financial Resource Strain: Not on file  Food Insecurity: No Food Insecurity (04/01/2023)   Hunger Vital Sign    Worried About Running Out of Food in the Last Year: Never true    Ran Out of Food in the Last Year: Never true  Transportation Needs: No Transportation Needs (04/01/2023)   PRAPARE - Administrator, Civil Service (Medical): No    Lack of Transportation (Non-Medical): No   Physical Activity: Not on file  Stress: Not on file  Social Connections: Not on file    Family History  Problem Relation Age of Onset   Cancer Mother        Female   Hypertension Mother     (Not in a hospital admission)   Allergies  Allergen Reactions   Bee Venom Anaphylaxis and Swelling   Penicillins Itching    Review of Systems: Negative except for what is mentioned in HPI.     Physical Exam: BP (!) 163/104   Pulse 84   Ht 5\' 4"  (1.626 m)   Wt 196 lb 6.4 oz (89.1 kg)   LMP 01/02/2016   BMI 33.71 kg/m  CONSTITUTIONAL: Well-developed, well-nourished female in no acute distress.  HENT:  Normocephalic, atraumatic, External right and left ear normal. Oropharynx is clear and moist EYES: Conjunctivae and EOM are normal.   NECK: Normal range of motion,  supple, no masses SKIN: Skin is warm and dry. No rash noted. Not diaphoretic. No erythema. No pallor. NEUROLGIC: Alert and oriented to person, place, and time. Normal reflexes, muscle tone coordination. No cranial nerve deficit noted. PSYCHIATRIC: Normal mood and affect. Normal behavior. Normal judgment and thought content. CARDIOVASCULAR: Normal heart rate noted, regular rhythm RESPIRATORY: Effort and breath sounds normal, no problems with respiration noted ABDOMEN: Soft, nontender, nondistended, large well healed midline scar noted PELVIC: SSE: large polyp that was photographed previously was not present today.  Cervix visualized with no dilation and no obvious polyp MUSCULOSKELETAL: Normal range of motion. No edema and no tenderness. 2+ distal pulses.   Pertinent Labs/Studies:   No results found for this or any previous visit (from the past 72 hours).     Assessment and Plan :Kathleen [name] is a 58 y.o. [id number] here for preop visit.   Plan for hysteroscopy dilation and curettage with possible polyp resection NPO Admission labs ordered VS Q4 Surgical clearance previously received.   Mariel Aloe,  M.D. Attending Obstetrician & Gynecologist, [facility] for [employer], [facility]

## 2023-08-30 ENCOUNTER — Ambulatory Visit: Payer: 59 | Admitting: Nurse Practitioner

## 2023-09-03 ENCOUNTER — Other Ambulatory Visit: Payer: Self-pay | Admitting: Lactation Services

## 2023-09-03 DIAGNOSIS — N950 Postmenopausal bleeding: Secondary | ICD-10-CM

## 2023-09-06 ENCOUNTER — Encounter: Payer: Self-pay | Admitting: Nurse Practitioner

## 2023-09-06 ENCOUNTER — Ambulatory Visit (INDEPENDENT_AMBULATORY_CARE_PROVIDER_SITE_OTHER): Payer: 59 | Admitting: Nurse Practitioner

## 2023-09-06 ENCOUNTER — Telehealth: Payer: Self-pay

## 2023-09-06 VITALS — BP 135/65 | HR 85 | Temp 98.0°F | Resp 16 | Ht 64.0 in | Wt 200.6 lb

## 2023-09-06 DIAGNOSIS — I739 Peripheral vascular disease, unspecified: Secondary | ICD-10-CM

## 2023-09-06 DIAGNOSIS — I509 Heart failure, unspecified: Secondary | ICD-10-CM

## 2023-09-06 DIAGNOSIS — E1169 Type 2 diabetes mellitus with other specified complication: Secondary | ICD-10-CM | POA: Diagnosis not present

## 2023-09-06 DIAGNOSIS — E1151 Type 2 diabetes mellitus with diabetic peripheral angiopathy without gangrene: Secondary | ICD-10-CM

## 2023-09-06 DIAGNOSIS — K047 Periapical abscess without sinus: Secondary | ICD-10-CM

## 2023-09-06 DIAGNOSIS — Z1212 Encounter for screening for malignant neoplasm of rectum: Secondary | ICD-10-CM

## 2023-09-06 DIAGNOSIS — Z0001 Encounter for general adult medical examination with abnormal findings: Secondary | ICD-10-CM

## 2023-09-06 DIAGNOSIS — E785 Hyperlipidemia, unspecified: Secondary | ICD-10-CM

## 2023-09-06 DIAGNOSIS — E559 Vitamin D deficiency, unspecified: Secondary | ICD-10-CM

## 2023-09-06 DIAGNOSIS — Z1231 Encounter for screening mammogram for malignant neoplasm of breast: Secondary | ICD-10-CM

## 2023-09-06 DIAGNOSIS — R109 Unspecified abdominal pain: Secondary | ICD-10-CM

## 2023-09-06 DIAGNOSIS — E782 Mixed hyperlipidemia: Secondary | ICD-10-CM

## 2023-09-06 DIAGNOSIS — M25561 Pain in right knee: Secondary | ICD-10-CM

## 2023-09-06 DIAGNOSIS — Z1211 Encounter for screening for malignant neoplasm of colon: Secondary | ICD-10-CM

## 2023-09-06 DIAGNOSIS — E538 Deficiency of other specified B group vitamins: Secondary | ICD-10-CM | POA: Diagnosis not present

## 2023-09-06 DIAGNOSIS — Z113 Encounter for screening for infections with a predominantly sexual mode of transmission: Secondary | ICD-10-CM

## 2023-09-06 MED ORDER — OXYCODONE HCL 5 MG PO TABS
5.0000 mg | ORAL_TABLET | Freq: Two times a day (BID) | ORAL | 0 refills | Status: DC | PRN
Start: 2023-09-06 — End: 2023-09-10

## 2023-09-06 MED ORDER — ROSUVASTATIN CALCIUM 5 MG PO TABS: 5.0000 mg | ORAL_TABLET | ORAL | 2 refills | Status: DC

## 2023-09-06 MED ORDER — ONDANSETRON 4 MG PO TBDP: 4.0000 mg | ORAL_TABLET | Freq: Three times a day (TID) | ORAL | 0 refills | Status: DC | PRN

## 2023-09-06 MED ORDER — OXYCODONE HCL 5 MG PO TABS: 5.0000 mg | ORAL_TABLET | Freq: Two times a day (BID) | ORAL | 0 refills | Status: DC | PRN

## 2023-09-06 MED ORDER — AZITHROMYCIN 250 MG PO TABS: ORAL_TABLET | ORAL | 0 refills | Status: DC

## 2023-09-06 NOTE — Progress Notes (Signed)
 [facility] [address] [place], [place] [address]  Internal MEDICINE  Office Visit Note  Patient Name: Kathleen [name]  [id number]  [id number]  Date of Service: 09/06/2023  Chief Complaint  Patient presents with   Hypertension   Annual Exam    HPI Sui presents for an annual well visit and physical exam.  Well-appearing 58 y.o. female with hypertension, type 2 diabetes, OSA on CPAP, PAD, kidney stones and CVD without HF Routine CRC screening: due for cologuard Routine mammogram: due now DEXA scan: due in 7 more years  Pap smear: see obgyn, overdue for pap Eye exam and/or foot exam: Labs: due for routine labs  New or worsening pain: chronic pain, no new pains  Other concerns: none     Current Medication: Outpatient Encounter Medications as of 09/06/2023  Medication Sig   acetaminophen (TYLENOL) 500 MG tablet Take 2 tablets (1,000 mg total) by mouth every 6 (six) hours as needed.   aspirin EC 81 MG tablet Take 1 tablet (81 mg total) by mouth daily. Swallow whole.   azithromycin (ZITHROMAX) 250 MG tablet Take 2 tablets on day 1, then 1 tablet daily on days 2 through 5   carvedilol (COREG) 25 MG tablet Take 1 tablet by mouth twice daily   cholecalciferol (VITAMIN D3) 25 MCG (1000 UNIT) tablet Take 1,000 Units by mouth daily in the afternoon.   Collagen-Vitamin C 1000-10 MG TABS Take 1 tablet by mouth daily.   Cyanocobalamin (VITAMIN B12 PO) Take 1 tablet by mouth daily.   lidocaine (LIDODERM) 5 % Place 1 patch onto the skin daily. To affected area. Remove & Discard patch within 12 hours or as directed by provider.   Menthol, Topical Analgesic, (BIOFREEZE ROLL-ON EX) Apply 1 Application topically as needed (knee pain).   Misc Natural Products (OSTEO BI-FLEX/5-LOXIN ADVANCED PO) Take 2 tablets by mouth daily.   misoprostol (CYTOTEC) 100 MCG tablet 1/2 tab per vagina the night before and the morning of the procedure   Multiple Vitamins-Minerals (ONE-A-DAY ENERGY  PO) Take 1 tablet by mouth daily.   Olmesartan-amLODIPine-HCTZ 40-5-12.5 MG TABS Take 1 tablet by mouth once daily   Omega-3 Fatty Acids (FISH OIL) 1000 MG CAPS Take 1 capsule by mouth at bedtime.   tiZANidine (ZANAFLEX) 4 MG tablet Take 1-2 tablets (4-8 mg total) by mouth every 6 (six) hours as needed for muscle spasms.   [DISCONTINUED] Baclofen 5 MG TABS Take by mouth.   [DISCONTINUED] ondansetron (ZOFRAN-ODT) 4 MG disintegrating tablet Take 1 tablet (4 mg total) by mouth every 8 (eight) hours as needed for nausea or vomiting.   [DISCONTINUED] oxyCODONE (OXY IR/ROXICODONE) 5 MG immediate release tablet Take 1 tablet (5 mg total) by mouth 2 (two) times daily as needed for moderate pain (pain score 4-6) or severe pain (pain score 7-10).   [DISCONTINUED] oxyCODONE (OXY IR/ROXICODONE) 5 MG immediate release tablet Take 1 tablet (5 mg total) by mouth 2 (two) times daily as needed for moderate pain (pain score 4-6) or severe pain (pain score 7-10).   [DISCONTINUED] rosuvastatin (CRESTOR) 5 MG tablet Take 1 tablet (5 mg total) by mouth every other day.   ondansetron (ZOFRAN-ODT) 4 MG disintegrating tablet Take 1 tablet (4 mg total) by mouth every 8 (eight) hours as needed for nausea or vomiting.   oxyCODONE (OXY IR/ROXICODONE) 5 MG immediate release tablet Take 1 tablet (5 mg total) by mouth 2 (two) times daily as needed for moderate pain (pain score 4-6) or severe pain (pain  score 7-10).   oxyCODONE (OXY IR/ROXICODONE) 5 MG immediate release tablet Take 1 tablet (5 mg total) by mouth 2 (two) times daily as needed for moderate pain (pain score 4-6) or severe pain (pain score 7-10).   rosuvastatin (CRESTOR) 5 MG tablet Take 1 tablet (5 mg total) by mouth every other day.   [DISCONTINUED] carvedilol (COREG) 25 MG tablet Take 1 tablet (25 mg total) by mouth 2 (two) times daily.   [EXPIRED] methylPREDNISolone acetate (DEPO-MEDROL) injection 80 mg    No facility-administered encounter medications on file as of  09/06/2023.    Surgical History: Past Surgical History:  Procedure Laterality Date   CESAREAN SECTION     3 cesarean sections    Medical History: Past Medical History:  Diagnosis Date   Hypertension    Kidney stone    Rheumatoid arthritis (HCC)     Family History: Family History  Problem Relation Age of Onset   Cancer Mother        Female   Hypertension Mother     Social History   Socioeconomic History   Marital status: Married    Spouse name: Not on file   Number of children: Not on file   Years of education: Not on file   Highest education level: Not on file  Occupational History   Not on file  Tobacco Use   Smoking status: Every Day    Current packs/day: 0.50    Types: Cigarettes   Smokeless tobacco: Never  Vaping Use   Vaping status: Former  Substance and Sexual Activity   Alcohol use: No   Drug use: No   Sexual activity: Yes    Birth control/protection: None  Other Topics Concern   Not on file  Social History Narrative   Not on file   Social Drivers of Health   Financial Resource Strain: Not on file  Food Insecurity: No Food Insecurity (04/01/2023)   Hunger Vital Sign    Worried About Running Out of Food in the Last Year: Never true    Ran Out of Food in the Last Year: Never true  Transportation Needs: No Transportation Needs (04/01/2023)   PRAPARE - Administrator, Civil Service (Medical): No    Lack of Transportation (Non-Medical): No  Physical Activity: Not on file  Stress: Not on file  Social Connections: Not on file  Intimate Partner Violence: Not At Risk (04/01/2023)   Humiliation, Afraid, Rape, and Kick questionnaire    Fear of Current or Ex-Partner: No    Emotionally Abused: No    Physically Abused: No    Sexually Abused: No      Review of Systems  Constitutional:  Negative for activity change, appetite change, chills, fatigue, fever and unexpected weight change.  HENT: Negative.  Negative for congestion, ear pain,  rhinorrhea, sore throat and trouble swallowing.   Eyes: Negative.   Respiratory: Negative.  Negative for cough, chest tightness, shortness of breath and wheezing.   Cardiovascular: Negative.  Negative for chest pain.  Gastrointestinal: Negative.  Negative for abdominal pain, blood in stool, constipation, diarrhea, nausea and vomiting.  Endocrine: Negative.   Genitourinary: Negative.  Negative for difficulty urinating, dysuria, frequency, hematuria and urgency.  Musculoskeletal: Negative.  Negative for arthralgias, back pain, joint swelling, myalgias and neck pain.  Skin: Negative.  Negative for rash and wound.  Allergic/Immunologic: Negative.  Negative for immunocompromised state.  Neurological: Negative.  Negative for dizziness, seizures, numbness and headaches.  Hematological: Negative.   Psychiatric/Behavioral:  Negative.  Negative for behavioral problems, self-injury and suicidal ideas. The patient is not nervous/anxious.     Vital Signs: BP 135/65 Comment: 164/98  Pulse 85   Temp 98 F (36.7 C)   Resp 16   Ht 5\' 4"  (1.626 m)   Wt 200 lb 9.6 oz (91 kg)   LMP 01/02/2016   SpO2 99%   BMI 34.43 kg/m    Physical Exam Vitals reviewed.  Constitutional:      General: She is not in acute distress.    Appearance: Normal appearance. She is well-developed. She is obese. She is not ill-appearing or diaphoretic.  HENT:     Head: Normocephalic and atraumatic.     Right Ear: Tympanic membrane, ear canal and external ear normal.     Left Ear: Tympanic membrane, ear canal and external ear normal.     Nose: Nose normal. No congestion or rhinorrhea.     Mouth/Throat:     Mouth: Mucous membranes are moist.     Pharynx: Oropharynx is clear. No oropharyngeal exudate or posterior oropharyngeal erythema.  Eyes:     General: No scleral icterus.       Right eye: No discharge.        Left eye: No discharge.     Extraocular Movements: Extraocular movements intact.     Conjunctiva/sclera:  Conjunctivae normal.     Pupils: Pupils are equal, round, and reactive to light.  Neck:     Thyroid: No thyromegaly.     Vascular: No JVD.     Trachea: No tracheal deviation.  Cardiovascular:     Rate and Rhythm: Normal rate and regular rhythm.     Pulses: Normal pulses.     Heart sounds: Normal heart sounds. No murmur heard.    No friction rub. No gallop.  Pulmonary:     Effort: Pulmonary effort is normal. No respiratory distress.     Breath sounds: Normal breath sounds. No stridor. No wheezing or rales.  Chest:     Chest wall: No tenderness.  Abdominal:     General: Bowel sounds are normal. There is no distension.     Palpations: Abdomen is soft. There is no mass.     Tenderness: There is no abdominal tenderness. There is no guarding or rebound.  Musculoskeletal:        General: No tenderness or deformity. Normal range of motion.     Cervical back: Normal range of motion and neck supple.  Lymphadenopathy:     Cervical: No cervical adenopathy.  Skin:    General: Skin is warm and dry.     Capillary Refill: Capillary refill takes less than 2 seconds.     Coloration: Skin is not pale.     Findings: No erythema or rash.  Neurological:     Mental Status: She is alert and oriented to person, place, and time.     Cranial Nerves: No cranial nerve deficit.     Motor: No abnormal muscle tone.     Coordination: Coordination normal.     Gait: Gait normal.     Deep Tendon Reflexes: Reflexes are normal and symmetric.  Psychiatric:        Mood and Affect: Mood normal.        Behavior: Behavior normal.        Thought Content: Thought content normal.        Judgment: Judgment normal.        Assessment/Plan: 1. Encounter for routine adult health examination  with abnormal findings (Primary) Age-appropriate preventive screenings and vaccinations discussed, annual physical exam completed. Routine labs for health maintenance ordered, see below. PHM updated.   - STI Profile - CBC with  Differential/Platelet - CMP14+EGFR - Lipid Profile - Hgb A1C w/o eAG - Vitamin D (25 hydroxy) - B12 and Folate Panel - Iron, TIBC and Ferritin Panel  2. Type 2 diabetes mellitus with diabetic peripheral angiopathy without gangrene, without long-term current use of insulin (HCC) Routine labs ordered  - Lipid Profile - Hgb A1C w/o eAG  3. Hyperlipidemia associated with type 2 diabetes mellitus (HCC) Routine labs ordered  - CBC with Differential/Platelet - CMP14+EGFR - Lipid Profile  4. PAD (peripheral artery disease) (HCC) Routine labs ordered  - CBC with Differential/Platelet - CMP14+EGFR - Lipid Profile - Hgb A1C w/o eAG - Vitamin D (25 hydroxy) - B12 and Folate Panel - Iron, TIBC and Ferritin Panel  5. B12 deficiency Routine labs ordered  - CBC with Differential/Platelet - B12 and Folate Panel - Iron, TIBC and Ferritin Panel  6. Vitamin D deficiency Routine lab ordered  - Vitamin D (25 hydroxy)  7. Acute pain of right knee Continue prn oxycodone as prescribed  8. Acute right flank pain Continue prn oxycodone as prescribed  9. Dental infection Zpak prescribed, take until gone   10. Screening for STDs (sexually transmitted diseases) Routine lab ordered  - STI Profile  11. Screening for colorectal cancer Cologuard test ordered  - Cologuard  12. Encounter for screening mammogram for malignant neoplasm of breast Mammogram ordered  - MM 3D SCREENING MAMMOGRAM BILATERAL BREAST; Future    General Counseling: Iza verbalizes understanding of the findings of todays visit and agrees with plan of treatment. I have discussed any further diagnostic evaluation that may be needed or ordered today. We also reviewed her medications today. she has been encouraged to call the office with any questions or concerns that should arise related to todays visit.    Orders Placed This Encounter  Procedures   MM 3D SCREENING MAMMOGRAM BILATERAL BREAST   Cologuard   STI  Profile   CBC with Differential/Platelet   CMP14+EGFR   Lipid Profile   Hgb A1C w/o eAG   Vitamin D (25 hydroxy)   B12 and Folate Panel   Iron, TIBC and Ferritin Panel    Meds ordered this encounter  Medications   oxyCODONE (OXY IR/ROXICODONE) 5 MG immediate release tablet    Sig: Take 1 tablet (5 mg total) by mouth 2 (two) times daily as needed for moderate pain (pain score 4-6) or severe pain (pain score 7-10).    Dispense:  60 tablet    Refill:  0    Refill of previous oxycodone prescription, for January   oxyCODONE (OXY IR/ROXICODONE) 5 MG immediate release tablet    Sig: Take 1 tablet (5 mg total) by mouth 2 (two) times daily as needed for moderate pain (pain score 4-6) or severe pain (pain score 7-10).    Dispense:  60 tablet    Refill:  0    Refill of previous oxycodone prescription   ondansetron (ZOFRAN-ODT) 4 MG disintegrating tablet    Sig: Take 1 tablet (4 mg total) by mouth every 8 (eight) hours as needed for nausea or vomiting.    Dispense:  20 tablet    Refill:  0   rosuvastatin (CRESTOR) 5 MG tablet    Sig: Take 1 tablet (5 mg total) by mouth every other day.    Dispense:  45  tablet    Refill:  2    For future refills   azithromycin (ZITHROMAX) 250 MG tablet    Sig: Take 2 tablets on day 1, then 1 tablet daily on days 2 through 5    Dispense:  6 tablet    Refill:  0    Fill new script today    Return in about 3 months (around 11/24/2023) for F/U, pain med refill, Brenton Joines PCP.   Total time spent:30 Minutes Time spent includes review of chart, medications, test results, and follow up plan with the patient.   [place] Controlled Substance Database was reviewed by me.  This patient was seen by [name], FNP-C in collaboration with Dr. Beverely Risen as a part of collaborative care agreement.  Nickolas Chalfin R. Tedd Sias, MSN, FNP-C Internal medicine

## 2023-09-06 NOTE — Telephone Encounter (Signed)
Completed P.A. for patient's Oxycodone.

## 2023-09-07 ENCOUNTER — Other Ambulatory Visit
Admission: RE | Admit: 2023-09-07 | Discharge: 2023-09-07 | Disposition: A | Discharge status: Home / Self Care | Payer: 59 | Attending: Nurse Practitioner | Admitting: Nurse Practitioner

## 2023-09-07 DIAGNOSIS — Z0001 Encounter for general adult medical examination with abnormal findings: Secondary | ICD-10-CM | POA: Diagnosis present

## 2023-09-07 DIAGNOSIS — E1151 Type 2 diabetes mellitus with diabetic peripheral angiopathy without gangrene: Secondary | ICD-10-CM | POA: Diagnosis not present

## 2023-09-07 DIAGNOSIS — I739 Peripheral vascular disease, unspecified: Secondary | ICD-10-CM | POA: Insufficient documentation

## 2023-09-07 DIAGNOSIS — E782 Mixed hyperlipidemia: Secondary | ICD-10-CM | POA: Insufficient documentation

## 2023-09-07 DIAGNOSIS — Z113 Encounter for screening for infections with a predominantly sexual mode of transmission: Secondary | ICD-10-CM | POA: Diagnosis not present

## 2023-09-07 DIAGNOSIS — E559 Vitamin D deficiency, unspecified: Secondary | ICD-10-CM | POA: Diagnosis not present

## 2023-09-07 DIAGNOSIS — E538 Deficiency of other specified B group vitamins: Secondary | ICD-10-CM | POA: Diagnosis not present

## 2023-09-07 LAB — LIPID PANEL
Cholesterol: 220 mg/dL — ABNORMAL HIGH (ref 0–200)
HDL: 56 mg/dL (ref >40)
LDL Cholesterol: 143 mg/dL — ABNORMAL HIGH (ref 0–99)
Total CHOL/HDL Ratio: 3.9 {ratio}
Triglycerides: 107 mg/dL (ref <150)
VLDL: 21 mg/dL (ref 0–40)

## 2023-09-07 LAB — COMPREHENSIVE METABOLIC PANEL
ALT: 17 U/L (ref 0–44)
AST: 17 U/L (ref 15–41)
Albumin: 4 g/dL (ref 3.5–5.0)
Alkaline Phosphatase: 99 U/L (ref 38–126)
Anion gap: 10 (ref 5–15)
BUN: 25 mg/dL — ABNORMAL HIGH (ref 6–20)
CO2: 26 mmol/L (ref 22–32)
Calcium: 10.3 mg/dL (ref 8.9–10.3)
Chloride: 103 mmol/L (ref 98–111)
Creatinine, Ser: 0.92 mg/dL (ref 0.44–1.00)
GFR, Estimated: >60 mL/min (ref >60)
Glucose, Bld: 120 mg/dL — ABNORMAL HIGH (ref 70–99)
Potassium: 3.9 mmol/L (ref 3.5–5.1)
Sodium: 139 mmol/L (ref 135–145)
Total Bilirubin: 0.5 mg/dL (ref 0.0–1.2)
Total Protein: 8.1 g/dL (ref 6.5–8.1)

## 2023-09-07 LAB — CBC WITH DIFFERENTIAL/PLATELET
Abs Immature Granulocytes: 0.02 10*3/uL (ref 0.00–0.07)
Basophils Absolute: 0 10*3/uL (ref 0.0–0.1)
Basophils Relative: 1 %
Eosinophils Absolute: 0.2 10*3/uL (ref 0.0–0.5)
Eosinophils Relative: 4 %
HCT: 42.1 % (ref 36.0–46.0)
Hemoglobin: 13.7 g/dL (ref 12.0–15.0)
Immature Granulocytes: 0 %
Lymphocytes Relative: 38 %
Lymphs Abs: 2.3 10*3/uL (ref 0.7–4.0)
MCH: 30.1 pg (ref 26.0–34.0)
MCHC: 32.5 g/dL (ref 30.0–36.0)
MCV: 92.5 fL (ref 80.0–100.0)
Monocytes Absolute: 0.5 10*3/uL (ref 0.1–1.0)
Monocytes Relative: 8 %
Neutro Abs: 2.9 10*3/uL (ref 1.7–7.7)
Neutrophils Relative %: 49 %
Platelets: 304 10*3/uL (ref 150–400)
RBC: 4.55 MIL/uL (ref 3.87–5.11)
RDW: 13.9 % (ref 11.5–15.5)
WBC: 5.9 10*3/uL (ref 4.0–10.5)
nRBC: 0 % (ref 0.0–0.2)

## 2023-09-07 LAB — VITAMIN D 25 HYDROXY (VIT D DEFICIENCY, FRACTURES): Vit D, 25-Hydroxy: 35.39 ng/mL (ref 30–100)

## 2023-09-07 LAB — HEMOGLOBIN A1C
Hgb A1c MFr Bld: 6.2 % — ABNORMAL HIGH (ref 4.8–5.6)
Mean Plasma Glucose: 131.24 mg/dL

## 2023-09-07 LAB — IRON AND TIBC
Iron: 58 ug/dL (ref 28–170)
Saturation Ratios: 16 % (ref 10.4–31.8)
TIBC: 353 ug/dL (ref 250–450)
UIBC: 295 ug/dL

## 2023-09-07 LAB — FOLATE: Folate: 16.1 ng/mL (ref >5.9)

## 2023-09-07 LAB — VITAMIN B12: Vitamin B-12: 1062 pg/mL — ABNORMAL HIGH (ref 180–914)

## 2023-09-07 LAB — FERRITIN: Ferritin: 176 ng/mL (ref 11–307)

## 2023-09-08 LAB — MISC LABCORP TEST (SEND OUT): Labcorp test code: 144011

## 2023-09-10 ENCOUNTER — Encounter (HOSPITAL_COMMUNITY): Payer: Self-pay | Admitting: Obstetrics and Gynecology

## 2023-09-13 ENCOUNTER — Encounter (HOSPITAL_COMMUNITY): Payer: Self-pay | Admitting: Obstetrics and Gynecology

## 2023-09-13 NOTE — Anesthesia Preprocedure Evaluation (Signed)
 Anesthesia Evaluation  Patient identified by MRN, date of birth, ID band Patient awake    Reviewed: Allergy & Precautions, NPO status , Patient's Chart, lab work & pertinent test results, reviewed documented beta blocker date and time   History of Anesthesia Complications Negative for: history of anesthetic complications  Airway Mallampati: II  TM Distance: >3 FB Neck ROM: Full    Dental  (+) Missing, Dental Advisory Given   Pulmonary sleep apnea (does not use CPAP) , COPD, Current SmokerPatient did not abstain from smoking.   breath sounds clear to auscultation       Cardiovascular hypertension, Pt. on medications and Pt. on home beta blockers (-) angina + Peripheral Vascular Disease   Rhythm:Regular Rate:Normal  '23 ECHO: EF 65-70%, mild LVH, normal LVF, normal RF, mod MR   Neuro/Psych negative neurological ROS     GI/Hepatic negative GI ROS, Neg liver ROS,,,  Endo/Other  diabetes (glu 120)  BMI 34.4  Renal/GU negative Renal ROS     Musculoskeletal  (+) Arthritis , Rheumatoid disorders,    Abdominal   Peds  Hematology Hb 13.7, plt 304k   Anesthesia Other Findings   Reproductive/Obstetrics                             Anesthesia Physical Anesthesia Plan  ASA: 3  Anesthesia Plan: General   Post-op Pain Management: Tylenol PO (pre-op)* and Minimal or no pain anticipated   Induction: Intravenous  PONV Risk Score and Plan: 2 and Ondansetron, Dexamethasone, Treatment may vary due to age or medical condition and Scopolamine patch - Pre-op  Airway Management Planned: LMA  Additional Equipment: None  Intra-op Plan:   Post-operative Plan:   Informed Consent: I have reviewed the patients History and Physical, chart, labs and discussed the procedure including the risks, benefits and alternatives for the proposed anesthesia with the patient or authorized representative who has indicated  his/her understanding and acceptance.     Dental advisory given  Plan Discussed with: CRNA and Surgeon  Anesthesia Plan Comments: (PAT note written 09/13/2023 by Shonna Chock, PA-C.  )       Anesthesia Quick Evaluation

## 2023-09-13 NOTE — Progress Notes (Signed)
 Anesthesia Chart Review: Kathleen [name]  Case: [id number] Date/Time: 09/14/23 1153   Procedure: DILATATION AND CURETTAGE /HYSTEROSCOPY AND POLYP RESECTION   Anesthesia type: Choice   Pre-op diagnosis:      Abnormal uterine bleeding     endocervical polyp     Thickened endometrium   Location: [facility] ROOM 09 / [facility]   Surgeons: Warden Fillers, MD       DISCUSSION: Patient is a 58 year old female scheduled for the above procedure.  History includes smoking, hypertensive heart disease without HF, RA, PAD, nephrolithiasis.  She had preoperative cardiology evaluation by Dr. Kirke Corin on 08/19/23. She has hypertensive heart disease without heart failure. HTN overall controlled on current medications. She had cut down on smoking, but had not yet been able to quit. He advised one year follow-up. In regards to surgery, "Preop cardiovascular evaluation for D&C. She has no cardiac symptoms at the present time, her functional capacity is greater than 4 METS and her EKG is normal. Thus, she does not require any ischemic cardiac evaluation before surgery. She is at low risk for cardiovascular complications."  Anesthesia team to remain on the day of surgery.   VS: LMP 01/02/2016  Wt Readings from Last 3 Encounters:  09/06/23 91 kg  08/23/23 89.1 kg  08/19/23 90 kg   BP Readings from Last 3 Encounters:  09/06/23 135/65  08/23/23 (!) 163/104  08/19/23 (!) 140/90   Pulse Readings from Last 3 Encounters:  09/06/23 85  08/23/23 84  08/19/23 79     PROVIDERS: [name], NP is PCP [name], MD is cardiologist  Festus Barren, MD is vascular surgeon   LABS: Last results in [facility] include: Lab Results  Component Value Date   WBC 5.9 09/07/2023   HGB 13.7 09/07/2023   HCT 42.1 09/07/2023   PLT 304 09/07/2023   GLUCOSE 120 (H) 09/07/2023   CHOL 220 (H) 09/07/2023   TRIG 107 09/07/2023   HDL 56 09/07/2023   LDLCALC 143 (H) 09/07/2023   ALT 17 09/07/2023   AST 17 09/07/2023   NA  139 09/07/2023   K 3.9 09/07/2023   CL 103 09/07/2023   CREATININE 0.92 09/07/2023   BUN 25 (H) 09/07/2023   CO2 26 09/07/2023   TSH 0.653 07/24/2021   HGBA1C 6.2 (H) 09/07/2023     IMAGES: MRI L-spine 03/31/23:  IMPRESSION: 1. No discitis-osteomyelitis or epidural abscess. 2. Small focus of hyperintense T2-weighted signal at the right subarticular recess at T12-L1. This is favored to be an extruded or sequestered disc fragment. 3. Opposing endplate edema at W1-X9, favored to be degenerative. 4. Grade 1 anterolisthesis at L4-L5 due to facet arthrosis. 5. No spinal canal or neural foraminal stenosis.  US Pelvis 03/31/23: IMPRESSION: - Thickened and heterogeneous endometrium. Possibilities include an aggressive or neoplastic process and recommend further evaluation. - Poor visualization of the ovaries.  No free fluid in the pelvis.   EKG: 08/19/23: NSR   CV: Echo 07/30/21: Conclusion: Mildly increased wall thickness.  Diastolic filling with E to A reversal pattern.  Normal systolic global function of left ventricle.  Calculated EF 68.7%.  Left atrial cavity is mildly dilated.  Aortic valve not well-visualized.  Mitral valve structurally normal.  Moderate mitral regurgitation.   Past Medical History:  Diagnosis Date   Hypertension    Hypertensive heart disease without heart failure    Kidney stone    PAD (peripheral artery disease) (HCC)    Rheumatoid arthritis (HCC)  Past Surgical History:  Procedure Laterality Date   CESAREAN SECTION     3 cesarean sections    MEDICATIONS: No current facility-administered medications for this encounter.    acetaminophen (TYLENOL) 500 MG tablet   aspirin EC 81 MG tablet   carvedilol (COREG) 25 MG tablet   cholecalciferol (VITAMIN D3) 25 MCG (1000 UNIT) tablet   Collagen-Vitamin C 1000-10 MG TABS   Cyanocobalamin (VITAMIN B12 PO)   ferrous sulfate 325 (65 FE) MG tablet   lidocaine (LIDODERM) 5 %   Menthol, Topical  Analgesic, (BIOFREEZE ROLL-ON EX)   Misc Natural Products (OSTEO BI-FLEX/5-LOXIN ADVANCED PO)   Multiple Vitamins-Minerals (ONE-A-DAY ENERGY PO)   Olmesartan-amLODIPine-HCTZ 40-5-12.5 MG TABS   Omega-3 Fatty Acids (FISH OIL) 1000 MG CAPS   oxyCODONE (OXY IR/ROXICODONE) 5 MG immediate release tablet   rosuvastatin (CRESTOR) 5 MG tablet   tiZANidine (ZANAFLEX) 4 MG tablet   misoprostol (CYTOTEC) 100 MCG tablet   ondansetron (ZOFRAN-ODT) 4 MG disintegrating tablet    Shonna Chock, PA-C Surgical Short Stay/Anesthesiology [facility] Phone [phone] [facility] Phone [phone] 09/13/2023 1:53 PM

## 2023-09-14 ENCOUNTER — Ambulatory Visit (HOSPITAL_COMMUNITY)
Admission: RE | Admit: 2023-09-14 | Discharge: 2023-09-14 | Disposition: A | Discharge status: Home / Self Care | Payer: Medicaid Other | Source: Ambulatory Visit | Attending: Obstetrics and Gynecology | Admitting: Obstetrics and Gynecology

## 2023-09-14 ENCOUNTER — Encounter (HOSPITAL_COMMUNITY)
Admission: RE | Disposition: A | Discharge status: Home / Self Care | Payer: Self-pay | Source: Ambulatory Visit | Attending: Obstetrics and Gynecology

## 2023-09-14 ENCOUNTER — Encounter (HOSPITAL_COMMUNITY): Payer: Self-pay | Admitting: Obstetrics and Gynecology

## 2023-09-14 ENCOUNTER — Other Ambulatory Visit: Payer: Self-pay

## 2023-09-14 ENCOUNTER — Ambulatory Visit (HOSPITAL_COMMUNITY): Payer: Self-pay | Admitting: Vascular Surgery

## 2023-09-14 ENCOUNTER — Ambulatory Visit (HOSPITAL_BASED_OUTPATIENT_CLINIC_OR_DEPARTMENT_OTHER): Payer: Self-pay | Admitting: Vascular Surgery

## 2023-09-14 DIAGNOSIS — I119 Hypertensive heart disease without heart failure: Secondary | ICD-10-CM | POA: Insufficient documentation

## 2023-09-14 DIAGNOSIS — M069 Rheumatoid arthritis, unspecified: Secondary | ICD-10-CM | POA: Insufficient documentation

## 2023-09-14 DIAGNOSIS — N950 Postmenopausal bleeding: Secondary | ICD-10-CM | POA: Insufficient documentation

## 2023-09-14 DIAGNOSIS — E119 Type 2 diabetes mellitus without complications: Secondary | ICD-10-CM | POA: Diagnosis not present

## 2023-09-14 DIAGNOSIS — F17210 Nicotine dependence, cigarettes, uncomplicated: Secondary | ICD-10-CM | POA: Diagnosis not present

## 2023-09-14 DIAGNOSIS — I10 Essential (primary) hypertension: Secondary | ICD-10-CM | POA: Diagnosis not present

## 2023-09-14 DIAGNOSIS — R9389 Abnormal findings on diagnostic imaging of other specified body structures: Secondary | ICD-10-CM

## 2023-09-14 DIAGNOSIS — J449 Chronic obstructive pulmonary disease, unspecified: Secondary | ICD-10-CM | POA: Insufficient documentation

## 2023-09-14 DIAGNOSIS — N938 Other specified abnormal uterine and vaginal bleeding: Secondary | ICD-10-CM | POA: Diagnosis present

## 2023-09-14 DIAGNOSIS — N840 Polyp of corpus uteri: Secondary | ICD-10-CM

## 2023-09-14 DIAGNOSIS — G4730 Sleep apnea, unspecified: Secondary | ICD-10-CM | POA: Diagnosis not present

## 2023-09-14 DIAGNOSIS — I739 Peripheral vascular disease, unspecified: Secondary | ICD-10-CM | POA: Insufficient documentation

## 2023-09-14 DIAGNOSIS — Z79899 Other long term (current) drug therapy: Secondary | ICD-10-CM | POA: Diagnosis not present

## 2023-09-14 DIAGNOSIS — R109 Unspecified abdominal pain: Secondary | ICD-10-CM

## 2023-09-14 DIAGNOSIS — M25561 Pain in right knee: Secondary | ICD-10-CM

## 2023-09-14 HISTORY — DX: Atherosclerotic heart disease of native coronary artery without angina pectoris: I25.10

## 2023-09-14 HISTORY — PX: HYSTEROSCOPY WITH D & C: SHX1775

## 2023-09-14 HISTORY — DX: Peripheral vascular disease, unspecified: I73.9

## 2023-09-14 HISTORY — DX: Hypertensive heart disease without heart failure: I11.9

## 2023-09-14 LAB — CBC
HCT: 42.4 % (ref 36.0–46.0)
Hemoglobin: 14.1 g/dL (ref 12.0–15.0)
MCH: 30.3 pg (ref 26.0–34.0)
MCHC: 33.3 g/dL (ref 30.0–36.0)
MCV: 91 fL (ref 80.0–100.0)
Platelets: 330 10*3/uL (ref 150–400)
RBC: 4.66 MIL/uL (ref 3.87–5.11)
RDW: 14.2 % (ref 11.5–15.5)
WBC: 7.1 10*3/uL (ref 4.0–10.5)
nRBC: 0 % (ref 0.0–0.2)

## 2023-09-14 LAB — ABO/RH: ABO/RH(D): A POS

## 2023-09-14 SURGERY — DILATATION AND CURETTAGE /HYSTEROSCOPY
Anesthesia: General | Site: Uterus

## 2023-09-14 MED ORDER — KETOROLAC TROMETHAMINE 30 MG/ML IJ SOLN
INTRAMUSCULAR | Status: DC | PRN
  Administered 2023-09-14: 30 mg via INTRAVENOUS

## 2023-09-14 MED ORDER — POVIDONE-IODINE 10 % EX SWAB
2.0000 | Freq: Once | CUTANEOUS | Status: AC
  Administered 2023-09-14: 2 via TOPICAL

## 2023-09-14 MED ORDER — PROPOFOL 10 MG/ML IV BOLUS
INTRAVENOUS | Status: DC | PRN
  Administered 2023-09-14: 200 mg via INTRAVENOUS

## 2023-09-14 MED ORDER — ONDANSETRON HCL 4 MG/2ML IJ SOLN
INTRAMUSCULAR | Status: AC
  Filled 2023-09-14: qty 2

## 2023-09-14 MED ORDER — MIDAZOLAM HCL 2 MG/2ML IJ SOLN: 0.5000 mg | Freq: Once | INTRAMUSCULAR | Status: DC | PRN

## 2023-09-14 MED ORDER — PHENYLEPHRINE 80 MCG/ML (10ML) SYRINGE FOR IV PUSH (FOR BLOOD PRESSURE SUPPORT)
PREFILLED_SYRINGE | INTRAVENOUS | Status: AC
  Filled 2023-09-14: qty 10

## 2023-09-14 MED ORDER — DEXAMETHASONE SODIUM PHOSPHATE 10 MG/ML IJ SOLN
INTRAMUSCULAR | Status: AC
  Filled 2023-09-14: qty 1

## 2023-09-14 MED ORDER — PROPOFOL 10 MG/ML IV BOLUS
INTRAVENOUS | Status: AC
  Filled 2023-09-14: qty 20

## 2023-09-14 MED ORDER — FENTANYL CITRATE (PF) 100 MCG/2ML IJ SOLN
25.0000 ug | INTRAMUSCULAR | Status: DC | PRN
  Administered 2023-09-14: 25 ug via INTRAVENOUS

## 2023-09-14 MED ORDER — MEPERIDINE HCL 25 MG/ML IJ SOLN: 6.2500 mg | INTRAMUSCULAR | Status: DC | PRN

## 2023-09-14 MED ORDER — SOD CITRATE-CITRIC ACID 500-334 MG/5ML PO SOLN: 30.0000 mL | ORAL | Status: AC

## 2023-09-14 MED ORDER — OXYCODONE HCL 5 MG PO TABS: 5.0000 mg | ORAL_TABLET | Freq: Two times a day (BID) | ORAL | 0 refills | Status: AC | PRN

## 2023-09-14 MED ORDER — LIDOCAINE 2% (20 MG/ML) 5 ML SYRINGE
INTRAMUSCULAR | Status: DC | PRN
  Administered 2023-09-14: 30 mg via INTRAVENOUS

## 2023-09-14 MED ORDER — SCOPOLAMINE 1 MG/3DAYS TD PT72
MEDICATED_PATCH | TRANSDERMAL | Status: AC
  Administered 2023-09-14: 1.5 mg via TRANSDERMAL
  Filled 2023-09-14: qty 1

## 2023-09-14 MED ORDER — OXYCODONE HCL 5 MG/5ML PO SOLN: 5.0000 mg | Freq: Once | ORAL | Status: DC | PRN

## 2023-09-14 MED ORDER — MIDAZOLAM HCL 2 MG/2ML IJ SOLN
INTRAMUSCULAR | Status: DC | PRN
  Administered 2023-09-14: 2 mg via INTRAVENOUS

## 2023-09-14 MED ORDER — FENTANYL CITRATE (PF) 100 MCG/2ML IJ SOLN
INTRAMUSCULAR | Status: AC
  Filled 2023-09-14: qty 2

## 2023-09-14 MED ORDER — KETOROLAC TROMETHAMINE 30 MG/ML IJ SOLN
INTRAMUSCULAR | Status: AC
  Filled 2023-09-14: qty 1

## 2023-09-14 MED ORDER — ORAL CARE MOUTH RINSE: 15.0000 mL | Freq: Once | OROMUCOSAL | Status: AC

## 2023-09-14 MED ORDER — PHENYLEPHRINE HCL (PRESSORS) 10 MG/ML IV SOLN
INTRAVENOUS | Status: DC | PRN
  Administered 2023-09-14 (×2): 160 ug via INTRAVENOUS

## 2023-09-14 MED ORDER — ACETAMINOPHEN 500 MG PO TABS: 1000.0000 mg | ORAL_TABLET | ORAL | Status: AC

## 2023-09-14 MED ORDER — SCOPOLAMINE 1 MG/3DAYS TD PT72: 1.0000 | MEDICATED_PATCH | TRANSDERMAL | Status: DC

## 2023-09-14 MED ORDER — SODIUM CHLORIDE 0.9 % IV SOLN: INTRAVENOUS | Status: DC

## 2023-09-14 MED ORDER — FENTANYL CITRATE (PF) 250 MCG/5ML IJ SOLN
INTRAMUSCULAR | Status: AC
  Filled 2023-09-14: qty 5

## 2023-09-14 MED ORDER — FENTANYL CITRATE (PF) 250 MCG/5ML IJ SOLN
INTRAMUSCULAR | Status: DC | PRN
Start: 2023-09-14 — End: 2023-09-14
  Administered 2023-09-14: 50 ug via INTRAVENOUS
  Administered 2023-09-14: 150 ug via INTRAVENOUS

## 2023-09-14 MED ORDER — CHLORHEXIDINE GLUCONATE 0.12 % MT SOLN
OROMUCOSAL | Status: AC
Start: 2023-09-14 — End: 2023-09-14
  Administered 2023-09-14: 15 mL via OROMUCOSAL
  Filled 2023-09-14: qty 15

## 2023-09-14 MED ORDER — LACTATED RINGERS IV SOLN: INTRAVENOUS | Status: DC

## 2023-09-14 MED ORDER — ONDANSETRON HCL 4 MG/2ML IJ SOLN
INTRAMUSCULAR | Status: DC | PRN
  Administered 2023-09-14: 4 mg via INTRAVENOUS

## 2023-09-14 MED ORDER — LIDOCAINE 2% (20 MG/ML) 5 ML SYRINGE
INTRAMUSCULAR | Status: AC
  Filled 2023-09-14: qty 5

## 2023-09-14 MED ORDER — DEXAMETHASONE SODIUM PHOSPHATE 10 MG/ML IJ SOLN
INTRAMUSCULAR | Status: DC | PRN
  Administered 2023-09-14: 10 mg via INTRAVENOUS

## 2023-09-14 MED ORDER — MIDAZOLAM HCL 2 MG/2ML IJ SOLN
INTRAMUSCULAR | Status: AC
  Filled 2023-09-14: qty 2

## 2023-09-14 MED ORDER — SODIUM CHLORIDE 0.9 % IR SOLN
Status: DC | PRN
  Administered 2023-09-14: 3000 mL

## 2023-09-14 MED ORDER — OXYCODONE HCL 5 MG PO TABS: 5.0000 mg | ORAL_TABLET | Freq: Once | ORAL | Status: DC | PRN

## 2023-09-14 MED ORDER — CHLORHEXIDINE GLUCONATE 0.12 % MT SOLN: 15.0000 mL | Freq: Once | OROMUCOSAL | Status: AC

## 2023-09-14 MED ORDER — ACETAMINOPHEN 500 MG PO TABS
ORAL_TABLET | ORAL | Status: AC
  Administered 2023-09-14: 1000 mg via ORAL
  Filled 2023-09-14: qty 2

## 2023-09-14 MED ORDER — SOD CITRATE-CITRIC ACID 500-334 MG/5ML PO SOLN
ORAL | Status: AC
  Administered 2023-09-14: 30 mL via ORAL
  Filled 2023-09-14: qty 30

## 2023-09-14 SURGICAL SUPPLY — 12 items
CATH ROBINSON RED A/P 16FR (CATHETERS) ×1 IMPLANT
DEVICE MYOSURE LITE (MISCELLANEOUS) IMPLANT
ELECT REM PT RETURN 9FT ADLT (ELECTROSURGICAL) ×1 IMPLANT
ELECTRODE REM PT RTRN 9FT ADLT (ELECTROSURGICAL) ×1 IMPLANT
GLOVE SURG ORTHO 8.0 STRL STRW (GLOVE) ×1 IMPLANT
GLOVE SURG UNDER POLY LF SZ7 (GLOVE) ×2 IMPLANT
GOWN STRL REUS W/ TWL LRG LVL3 (GOWN DISPOSABLE) ×2 IMPLANT
KIT PROCEDURE FLUENT (KITS) ×1 IMPLANT
LOOP CUTTING BIPOLAR 21FR (ELECTRODE) IMPLANT
PACK VAGINAL MINOR WOMEN LF (CUSTOM PROCEDURE TRAY) ×1 IMPLANT
PAD OB MATERNITY 11 LF (PERSONAL CARE ITEMS) ×1 IMPLANT
TOWEL GREEN STERILE FF (TOWEL DISPOSABLE) ×2 IMPLANT

## 2023-09-14 NOTE — Interval H&P Note (Signed)
 History and Physical Interval Note:  09/14/2023 11:58 AM  Kathleen [name]  has presented today for surgery, with the diagnosis of Abnormal uterine bleeding endocervical polyp Thickened endometrium.  The various methods of treatment have been discussed with the patient and family. After consideration of risks, benefits and other options for treatment, the patient has consented to  Procedure(s): DILATATION AND CURETTAGE /HYSTEROSCOPY AND POLYP RESECTION (N/A) as a surgical intervention.  The patient's history has been reviewed, patient examined, no change in status, stable for surgery.  I have reviewed the patient's chart and labs.  Questions were answered to the patient's satisfaction.     Warden Fillers

## 2023-09-14 NOTE — Op Note (Signed)
 PREOPERATIVE DIAGNOSIS:  Postmenopausal uterine bleeding, uterine polyp POSTOPERATIVE DIAGNOSIS: The same PROCEDURE: Hysteroscopy, Dilation and Curettage.  Myosure polypectomy SURGEON:  Dr. Mariel Aloe   INDICATIONS: 58 y.o. [id number]  here for scheduled surgery for the aforementioned diagnoses.   Risks of surgery were discussed with the patient including but not limited to: bleeding which may require transfusion; infection which may require antibiotics; injury to uterus or surrounding organs; intrauterine scarring which may impair future fertility; need for additional procedures including laparotomy or laparoscopy; and other postoperative/anesthesia complications. Written informed consent was obtained.    FINDINGS:  A 11 week size uterus.  Large uterine polyp arising from the fundus extending down the cervical canal.  ANESTHESIA:   General  INTRAVENOUS FLUIDS:  500 ml of LR FLUID DEFICITS:  of NS, significant unquantified loss on the floor ESTIMATED BLOOD LOSS:  Less than 20 ml SPECIMENS: Endometrial curettings and polyp fragments sent to pathology COMPLICATIONS:  None immediate.  PROCEDURE DETAILS:  The patient was then taken to the operating room where general anesthesia was administered and was found to be adequate.  After an adequate timeout was performed, she was placed in the dorsal lithotomy position and examined; then prepped and draped in the sterile manner.   Her bladder was catheterized for an unmeasured amount of clear, yellow urine. A speculum was then placed in the patient's vagina and a single tooth tenaculum was applied to the anterior lip of the cervix.  Cervix was extremely difficult to find due to multiple previous cesarean sections and body habitus.  The cervix and uterus were severely anteverted.  The uterus was sounded to 11 cm and the cervix was dilated manually with metal dilators to accommodate the 5 mm diagnostic hysteroscope.  The hysteroscope was then inserted  under direct visualization using NS as a suspension medium.  The uterine cavity was carefully examined with the findings as noted above. A large uterine polyp was noted in the canal arising from the uterine fundus.  The myosure device was inserted through the hysteroscope and a large portion of the polyp was resected.  At this point visualization was suboptimal within the uterine cavity.  The remainder of the polyp was removed using polyp forceps and uterine curettage.  After further careful visualization of the uterine cavity, the hysteroscope was removed under direct visualization.  A sharp curettage was then performed to obtain a moderate amount of endometrial curettings.  The tenaculum was removed from the anterior lip of the cervix and the vaginal speculum was removed after noting good hemostasis.  The patient tolerated the procedure well and was taken to the recovery area awake, extubated and in stable condition.   The patient will be discharged to home as per PACU criteria.  Routine postoperative instructions given.  She was prescribed Percocet.  She will follow up in the clinic in 2-3 weeks  for postoperative evaluation.   Mariel Aloe, MD, FACOG Obstetrician & Gynecologist, [facility] for [facility], [facility]

## 2023-09-14 NOTE — Transfer of Care (Signed)
 Immediate Anesthesia Transfer of Care Note  Patient: Kathleen [name]  Procedure(s) Performed: DILATATION AND CURETTAGE /HYSTEROSCOPY AND POLYP RESECTION (Uterus)  Patient Location: PACU  Anesthesia Type:General  Level of Consciousness: drowsy  Airway & Oxygen Therapy: Patient Spontanous Breathing and Patient connected to face mask oxygen  Post-op Assessment: Report given to RN and Post -op Vital signs reviewed and stable  Post vital signs: Reviewed and stable  Last Vitals:  Vitals Value Taken Time  BP 128/70 09/14/23 1312  Temp    Pulse 68 09/14/23 1315  Resp 13 09/14/23 1315  SpO2 96 % 09/14/23 1315  Vitals shown include unfiled device data.  Last Pain:  Vitals:   09/14/23 1126  TempSrc:   PainSc: 0-No pain         Complications: No notable events documented.

## 2023-09-14 NOTE — Discharge Instructions (Signed)
  Post Anesthesia Home Care Instructions  Activity: Get plenty of rest for the remainder of the day. A responsible individual must stay with you for 24 hours following the procedure.  For the next 24 hours, DO NOT: -Drive a car -Advertising copywriter -Drink alcoholic beverages -Take any medication unless instructed by your physician -Make any legal decisions or sign important papers.  Meals: Start with liquid foods such as gelatin or soup. Progress to regular foods as tolerated. Avoid greasy, spicy, heavy foods. If nausea and/or vomiting occur, drink only clear liquids until the nausea and/or vomiting subsides. Call your physician if vomiting continues.  Special Instructions/Symptoms: Your throat may feel dry or sore from the anesthesia or the breathing tube placed in your throat during surgery. If this causes discomfort, gargle with warm salt water. The discomfort should disappear within 24 hours.  If you had a scopolamine patch placed behind your ear for the management of post- operative nausea and/or vomiting:  1. The medication in the patch is effective for 72 hours, after which it should be removed.  Wrap patch in a tissue and discard in the trash. Wash hands thoroughly with soap and water. 2. You may remove the patch earlier than 72 hours if you experience unpleasant side effects which may include dry mouth, dizziness or visual disturbances. 3. Avoid touching the patch. Wash your hands with soap and water after contact with the patch.       No acetaminophen/Tylenol until after 5:30 pm today if needed.  No ibuprofen, Advil, Aleve, Motrin, ketorolac, meloxicam, naproxen, or other NSAIDS until after 7:00 pm today if needed.

## 2023-09-14 NOTE — Anesthesia Procedure Notes (Signed)
 Procedure Name: LMA Insertion Date/Time: 09/14/2023 12:23 PM  Performed by: Einar Grad, CRNAPre-anesthesia Checklist: Patient identified, Emergency Drugs available, Suction available and Patient being monitored Patient Re-evaluated:Patient Re-evaluated prior to induction Oxygen Delivery Method: Circle System Utilized Preoxygenation: Pre-oxygenation with 100% oxygen Induction Type: IV induction Ventilation: Mask ventilation without difficulty LMA: LMA inserted LMA Size: 4.0 Number of attempts: 1 Airway Equipment and Method: Bite block Placement Confirmation: positive ETCO2 Tube secured with: Tape Dental Injury: Teeth and Oropharynx as per pre-operative assessment

## 2023-09-14 NOTE — Anesthesia Postprocedure Evaluation (Signed)
 Anesthesia Post Note  Patient: Kathleen [name]  Procedure(s) Performed: DILATATION AND CURETTAGE /HYSTEROSCOPY AND POLYP RESECTION (Uterus)     Patient location during evaluation: PACU Anesthesia Type: General Level of consciousness: awake and alert, patient cooperative and oriented Pain management: pain level controlled Vital Signs Assessment: post-procedure vital signs reviewed and stable Respiratory status: spontaneous breathing, nonlabored ventilation and respiratory function stable Cardiovascular status: blood pressure returned to baseline and stable Postop Assessment: no apparent nausea or vomiting and able to ambulate Anesthetic complications: no   No notable events documented.  Last Vitals:  Vitals:   09/14/23 1330 09/14/23 1345  BP: (!) 115/49 125/66  Pulse: 71 68  Resp: 16 17  Temp:    SpO2: 90% 94%    Last Pain:  Vitals:   09/14/23 1315  TempSrc:   PainSc: 0-No pain                 Alek Poncedeleon,E. Cortavious Nix

## 2023-09-15 ENCOUNTER — Encounter: Payer: Self-pay | Admitting: Obstetrics and Gynecology

## 2023-09-15 ENCOUNTER — Encounter (HOSPITAL_COMMUNITY): Payer: Self-pay | Admitting: Obstetrics and Gynecology

## 2023-09-15 LAB — TYPE AND SCREEN
ABO/RH(D): A POS
Antibody Screen: POSITIVE
PT AG Type: NEGATIVE
Unit division: 0
Unit division: 0

## 2023-09-15 LAB — BPAM RBC
Blood Product Expiration Date: 202503052359
Blood Product Expiration Date: 202503212359
Unit Type and Rh: 6200
Unit Type and Rh: 6200

## 2023-09-15 LAB — SURGICAL PATHOLOGY

## 2023-09-22 ENCOUNTER — Inpatient Hospital Stay
Admission: RE | Admit: 2023-09-22 | Discharge: 2023-09-22 | Disposition: A | Discharge status: Home / Self Care | Payer: Self-pay | Source: Ambulatory Visit | Attending: Nurse Practitioner | Admitting: Nurse Practitioner

## 2023-09-22 ENCOUNTER — Other Ambulatory Visit: Payer: Self-pay | Admitting: *Deleted

## 2023-09-22 DIAGNOSIS — Z1231 Encounter for screening mammogram for malignant neoplasm of breast: Secondary | ICD-10-CM

## 2023-09-29 ENCOUNTER — Other Ambulatory Visit: Payer: Self-pay | Admitting: Nurse Practitioner

## 2023-09-29 ENCOUNTER — Telehealth: Payer: Self-pay

## 2023-09-29 ENCOUNTER — Other Ambulatory Visit: Payer: Self-pay | Admitting: *Deleted

## 2023-09-29 ENCOUNTER — Telehealth: Payer: Self-pay | Admitting: *Deleted

## 2023-09-29 DIAGNOSIS — Z1211 Encounter for screening for malignant neoplasm of colon: Secondary | ICD-10-CM

## 2023-09-29 DIAGNOSIS — R195 Other fecal abnormalities: Secondary | ICD-10-CM

## 2023-09-29 LAB — COLOGUARD: COLOGUARD: POSITIVE — AB

## 2023-09-29 MED ORDER — NA SULFATE-K SULFATE-MG SULF 17.5-3.13-1.6 GM/177ML PO SOLN: 1.0000 | Freq: Once | ORAL | 0 refills | Status: AC

## 2023-09-29 NOTE — Telephone Encounter (Signed)
-----   Message from [name] sent at 09/29/2023  7:41 AM EDT ----- Cologuard test is positive. Will need to have follow up colonoscopy. I have referred her to [facility]

## 2023-09-29 NOTE — Telephone Encounter (Signed)
 Pt advised cologuard is positive we did referral for GI

## 2023-09-29 NOTE — Telephone Encounter (Signed)
 Left message for patient to give office a call.

## 2023-09-29 NOTE — Progress Notes (Signed)
 Cologuard test is positive. Will need to have follow up colonoscopy. I have referred her to [facility]

## 2023-09-29 NOTE — Telephone Encounter (Signed)
 Gastroenterology Pre-Procedure Review  Request Date: 10/25/2023 Requesting Physician: Dr. Servando Snare  PATIENT REVIEW QUESTIONS: The patient responded to the following health history questions as indicated:    1. Are you having any GI issues? no 2. Do you have a personal history of Polyps? no 3. Do you have a family history of Colon Cancer or Polyps? no 4. Diabetes Mellitus? no 5. Joint replacements in the past 12 months?no 6. Major health problems in the past 3 months?no 7. Any artificial heart valves, MVP, or defibrillator?no    MEDICATIONS & ALLERGIES:    Patient reports the following regarding taking any anticoagulation/antiplatelet therapy:   Plavix, Coumadin, Eliquis, Xarelto, Lovenox, Pradaxa, Brilinta, or Effient? no Aspirin? no  Patient confirms/reports the following medications:  Current Outpatient Medications  Medication Sig Dispense Refill   acetaminophen (TYLENOL) 500 MG tablet Take 2 tablets (1,000 mg total) by mouth every 6 (six) hours as needed. 30 tablet 0   aspirin EC 81 MG tablet Take 1 tablet (81 mg total) by mouth daily. Swallow whole. 30 tablet 11   carvedilol (COREG) 25 MG tablet Take 1 tablet by mouth twice daily 60 tablet 5   cholecalciferol (VITAMIN D3) 25 MCG (1000 UNIT) tablet Take 1,000 Units by mouth daily in the afternoon.     Collagen-Vitamin C 1000-10 MG TABS Take 1 tablet by mouth daily.     Cyanocobalamin (VITAMIN B12 PO) Take 1 tablet by mouth daily.     ferrous sulfate 325 (65 FE) MG tablet Take 325 mg by mouth daily with breakfast.     lidocaine (LIDODERM) 5 % Place 1 patch onto the skin daily. To affected area. Remove & Discard patch within 12 hours or as directed by provider. (Patient taking differently: Place 1 patch onto the skin daily as needed (pain). To affected area. Remove & Discard patch within 12 hours or as directed by provider.) 30 patch 2   Menthol, Topical Analgesic, (BIOFREEZE ROLL-ON EX) Apply 1 Application topically as needed (knee pain).      Misc Natural Products (OSTEO BI-FLEX/5-LOXIN ADVANCED PO) Take 1 tablet by mouth daily.     Multiple Vitamins-Minerals (ONE-A-DAY ENERGY PO) Take 1 tablet by mouth daily.     Olmesartan-amLODIPine-HCTZ 40-5-12.5 MG TABS Take 1 tablet by mouth once daily 90 tablet 0   Omega-3 Fatty Acids (FISH OIL) 1000 MG CAPS Take 1 capsule by mouth at bedtime.     oxyCODONE (OXY IR/ROXICODONE) 5 MG immediate release tablet Take 1 tablet (5 mg total) by mouth 2 (two) times daily as needed for moderate pain (pain score 4-6) or severe pain (pain score 7-10). 14 tablet 0   rosuvastatin (CRESTOR) 5 MG tablet Take 1 tablet (5 mg total) by mouth every other day. 45 tablet 2   tiZANidine (ZANAFLEX) 4 MG tablet Take 1-2 tablets (4-8 mg total) by mouth every 6 (six) hours as needed for muscle spasms. 120 tablet 2   No current facility-administered medications for this visit.    Patient confirms/reports the following allergies:  Allergies  Allergen Reactions   Bee Venom Anaphylaxis and Swelling   Penicillins Itching    No orders of the defined types were placed in this encounter.   AUTHORIZATION INFORMATION Primary Insurance: 1D#: Group #:  Secondary Insurance: 1D#: Group #:  SCHEDULE INFORMATION: Date: 10/25/2023 Time: Location:  [facility]

## 2023-09-30 ENCOUNTER — Ambulatory Visit
Admission: RE | Admit: 2023-09-30 | Discharge: 2023-09-30 | Disposition: A | Discharge status: Home / Self Care | Payer: 59 | Source: Ambulatory Visit | Attending: Nurse Practitioner | Admitting: Nurse Practitioner

## 2023-09-30 ENCOUNTER — Telehealth: Payer: Self-pay | Admitting: Cardiovascular Disease

## 2023-09-30 DIAGNOSIS — Z1231 Encounter for screening mammogram for malignant neoplasm of breast: Secondary | ICD-10-CM | POA: Insufficient documentation

## 2023-09-30 NOTE — Telephone Encounter (Signed)
   Pre-operative Risk Assessment    Patient Name: Kathleen [name]  DOB: Oct 24, 1965 MRN: [id number]   Date of last office visit: 08/19/23  Date of next office visit: n/a   Request for Surgical Clearance    Procedure:  Colonoscopy  Date of Surgery:  Clearance 10/25/23                                Surgeon:  not indicated Surgeon's Group or Practice Name:  [facility] Phone number:  [phone] Fax number:  [phone]   Type of Clearance Requested:   - Medical    Type of Anesthesia:  General    Additional requests/questions:    Signed, Narda Amber   09/30/2023, 11:31 AM

## 2023-10-01 NOTE — Telephone Encounter (Signed)
   Patient Name: Kathleen [name]  DOB: 10-Nov-1965 MRN: [id number]  Primary Cardiologist: [name], MD  Chart reviewed as part of pre-operative protocol coverage. Given past medical history and time since last visit, based on ACC/AHA guidelines, [name] is at acceptable risk for the planned procedure without further cardiovascular testing.   Patient was seen by Dr. Kirke Corin on 08/19/2023 and was doing well.  She was cleared for surgery at that time. Per Dr. Kirke Corin, "She has no cardiac symptoms at the present time, her functional capacity is greater than 4 METS and her EKG is normal. Thus, she does not require any ischemic cardiac evaluation before surgery. She is at low risk for cardiovascular complications."  I will route this recommendation to the requesting party via Epic fax function and remove from pre-op pool.  Please call with questions.  Joylene Grapes, NP 10/01/2023, 8:40 AM

## 2023-10-04 ENCOUNTER — Other Ambulatory Visit: Payer: Self-pay | Admitting: Nurse Practitioner

## 2023-10-04 DIAGNOSIS — R928 Other abnormal and inconclusive findings on diagnostic imaging of breast: Secondary | ICD-10-CM

## 2023-10-07 ENCOUNTER — Other Ambulatory Visit: Payer: Self-pay

## 2023-10-07 ENCOUNTER — Ambulatory Visit: Payer: 59 | Admitting: Obstetrics and Gynecology

## 2023-10-07 VITALS — BP 137/86 | HR 85 | Wt 201.0 lb

## 2023-10-07 DIAGNOSIS — Z4889 Encounter for other specified surgical aftercare: Secondary | ICD-10-CM | POA: Diagnosis not present

## 2023-10-07 NOTE — Progress Notes (Signed)
    Subjective:    Kathleen [name] is a 58 y.o. female who presents to the clinic status post hysteroscopy, polypectomy and D and C on 09/14/23. The patient is not having any pain.  Eating a regular diet without difficulty. Bowel movements are normal. No other significant postoperative concerns.  The following portions of the patient's history were reviewed and updated as appropriate: allergies, current medications, past family history, past medical history, past social history, past surgical history, and problem list..  Last pap smear is not reflected in the chart, she will return in 3 months for pap.  Review of Systems Pertinent items are noted in HPI.   Objective:   BP 137/86   Pulse 85   Wt 201 lb (91.2 kg)   LMP 01/02/2016   BMI 34.50 kg/m  Constitutional:  Well-developed, well-nourished female in no acute distress.   Skin: Skin is warm and dry, no rash noted, not diaphoretic,no erythema, no pallor.  Cardiovascular: Normal heart rate noted  Respiratory: Effort and breath sounds normal, no problems with respiration noted  Abdomen: Soft, bowel sounds active, non-tender, no abnormal masses  Incision: N/a  Pelvic:   Deferred   Surgical pathology () Benign endocervical/endometrial polyp Assessment:   Doing well postoperatively.  Operative findings again reviewed. Pathology report discussed.   Plan:   1. Continue any current medications. 2. Wound care discussed. 3. Activity restrictions: none 4. Anticipated return to work: now. 5. Follow up in 3 months for pap. 6.  Routine preventative health maintenance measures emphasized. Please refer to After Visit Summary for other counseling recommendations.    Mariel Aloe, MD, FACOG Attending Obstetrician & Gynecologist Center for [facility], [facility]

## 2023-10-08 ENCOUNTER — Ambulatory Visit
Admission: RE | Admit: 2023-10-08 | Discharge: 2023-10-08 | Disposition: A | Discharge status: Home / Self Care | Source: Ambulatory Visit | Attending: Nurse Practitioner | Admitting: Nurse Practitioner

## 2023-10-08 DIAGNOSIS — R928 Other abnormal and inconclusive findings on diagnostic imaging of breast: Secondary | ICD-10-CM | POA: Insufficient documentation

## 2023-10-11 ENCOUNTER — Telehealth: Payer: Self-pay | Admitting: Nurse Practitioner

## 2023-10-11 ENCOUNTER — Other Ambulatory Visit: Payer: Self-pay | Admitting: Nurse Practitioner

## 2023-10-11 ENCOUNTER — Encounter: Payer: Self-pay | Admitting: Nurse Practitioner

## 2023-10-11 ENCOUNTER — Ambulatory Visit: Admitting: Nurse Practitioner

## 2023-10-11 ENCOUNTER — Telehealth: Payer: Self-pay

## 2023-10-11 VITALS — BP 136/82 | HR 86 | Temp 98.1°F | Resp 16 | Ht 64.0 in | Wt 202.0 lb

## 2023-10-11 DIAGNOSIS — E1151 Type 2 diabetes mellitus with diabetic peripheral angiopathy without gangrene: Secondary | ICD-10-CM

## 2023-10-11 DIAGNOSIS — E1169 Type 2 diabetes mellitus with other specified complication: Secondary | ICD-10-CM

## 2023-10-11 DIAGNOSIS — N63 Unspecified lump in unspecified breast: Secondary | ICD-10-CM

## 2023-10-11 DIAGNOSIS — E782 Mixed hyperlipidemia: Secondary | ICD-10-CM

## 2023-10-11 DIAGNOSIS — Z01818 Encounter for other preprocedural examination: Secondary | ICD-10-CM | POA: Diagnosis not present

## 2023-10-11 DIAGNOSIS — I10 Essential (primary) hypertension: Secondary | ICD-10-CM

## 2023-10-11 DIAGNOSIS — E785 Hyperlipidemia, unspecified: Secondary | ICD-10-CM

## 2023-10-11 DIAGNOSIS — R928 Other abnormal and inconclusive findings on diagnostic imaging of breast: Secondary | ICD-10-CM

## 2023-10-11 MED ORDER — ROSUVASTATIN CALCIUM 5 MG PO TABS: 5.0000 mg | ORAL_TABLET | Freq: Every day | ORAL | 2 refills | Status: DC

## 2023-10-11 MED ORDER — FISH OIL 1000 MG PO CAPS
1.0000 | ORAL_CAPSULE | Freq: Every evening | ORAL | 3 refills | Status: AC
Start: 2023-10-11 — End: ?

## 2023-10-11 NOTE — Telephone Encounter (Signed)
 Surgical clearance completed. Faxed to [facility]; [phone]. Scanned.

## 2023-10-11 NOTE — Telephone Encounter (Signed)
 Completed P.A. for patient's Crestor.

## 2023-10-11 NOTE — Progress Notes (Signed)
 [facility] [address] [place], [place] [address]  Internal MEDICINE  Office Visit Note  Patient Name: Kathleen [name]  [id number]  [id number]  Date of Service: 10/11/2023  Chief Complaint  Patient presents with   Hypertension   Follow-up    Discuss diabetes.     HPI Soriya presents for a follow-up visit for diabetes, high cholesterol, hypertension, and positive cologuard.  Diabetes -- well controlled, A1c is 6.2, which is improved from A1c of 6.5 a year ago.  High cholesterol -- LDL is 143, HDL 56. Has been taking rosuvastatin every other day.  Hypertension -- patient taking 4 BP medicationswhich is controlling BP very well Had a positive cologuard, scheduled for colonoscopy on 10/25/23. Preop clearance today, she is good to go.   The 10-year ASCVD risk score (Arnett DK, et al., 2019) is: 30.5%   Values used to calculate the score:     Age: 58 years     Sex: Female     Is Non-Hispanic African American: Yes     Diabetic: Yes     Tobacco smoker: Yes     Systolic Blood Pressure: 136 mmHg     Is BP treated: Yes     HDL Cholesterol: 56 mg/dL     Total Cholesterol: 220 mg/dL     Current Medication: Outpatient Encounter Medications as of 10/11/2023  Medication Sig Note   acetaminophen (TYLENOL) 500 MG tablet Take 2 tablets (1,000 mg total) by mouth every 6 (six) hours as needed.    aspirin EC 81 MG tablet Take 1 tablet (81 mg total) by mouth daily. Swallow whole.    carvedilol (COREG) 25 MG tablet Take 1 tablet by mouth twice daily    cholecalciferol (VITAMIN D3) 25 MCG (1000 UNIT) tablet Take 1,000 Units by mouth daily in the afternoon.    Collagen-Vitamin C 1000-10 MG TABS Take 1 tablet by mouth daily.    Cyanocobalamin (VITAMIN B12 PO) Take 1 tablet by mouth daily.    ferrous sulfate 325 (65 FE) MG tablet Take 325 mg by mouth daily with breakfast.    lidocaine (LIDODERM) 5 % Place 1 patch onto the skin daily. To affected area. Remove & Discard patch within  12 hours or as directed by provider. (Patient taking differently: Place 1 patch onto the skin daily as needed (pain). To affected area. Remove & Discard patch within 12 hours or as directed by provider.)    Menthol, Topical Analgesic, (BIOFREEZE ROLL-ON EX) Apply 1 Application topically as needed (knee pain).    Misc Natural Products (OSTEO BI-FLEX/5-LOXIN ADVANCED PO) Take 1 tablet by mouth daily.    Multiple Vitamins-Minerals (ONE-A-DAY ENERGY PO) Take 1 tablet by mouth daily.    Olmesartan-amLODIPine-HCTZ 40-5-12.5 MG TABS Take 1 tablet by mouth once daily 09/10/2023: Bedtime    oxyCODONE (OXY IR/ROXICODONE) 5 MG immediate release tablet Take 1 tablet (5 mg total) by mouth 2 (two) times daily as needed for moderate pain (pain score 4-6) or severe pain (pain score 7-10).    tiZANidine (ZANAFLEX) 4 MG tablet Take 1-2 tablets (4-8 mg total) by mouth every 6 (six) hours as needed for muscle spasms.    [DISCONTINUED] Omega-3 Fatty Acids (FISH OIL) 1000 MG CAPS Take 1 capsule by mouth at bedtime.    [DISCONTINUED] rosuvastatin (CRESTOR) 5 MG tablet Take 1 tablet (5 mg total) by mouth every other day.    Omega-3 Fatty Acids (FISH OIL) 1000 MG CAPS Take 1 capsule (1,000 mg total) by mouth  at bedtime.    rosuvastatin (CRESTOR) 5 MG tablet Take 1 tablet (5 mg total) by mouth daily.    No facility-administered encounter medications on file as of 10/11/2023.    Surgical History: Past Surgical History:  Procedure Laterality Date   CESAREAN SECTION     3 cesarean sections   HYSTEROSCOPY WITH D & C N/A 09/14/2023   Procedure: DILATATION AND CURETTAGE /HYSTEROSCOPY AND POLYP RESECTION;  Surgeon: Warden Fillers, MD;  Location: [facility] OR;  Service: Gynecology;  Laterality: N/A;    Medical History: Past Medical History:  Diagnosis Date   Hypertension    Hypertensive heart disease without heart failure    PAD (peripheral artery disease) (HCC)    Rheumatoid arthritis (HCC)     Family History: Family  History  Problem Relation Age of Onset   Cancer Mother        Female   Hypertension Mother    Breast cancer Neg Hx     Social History   Socioeconomic History   Marital status: Married    Spouse name: Not on file   Number of children: Not on file   Years of education: Not on file   Highest education level: Not on file  Occupational History   Not on file  Tobacco Use   Smoking status: Every Day    Current packs/day: 0.50    Types: Cigarettes   Smokeless tobacco: Never  Vaping Use   Vaping status: Former  Substance and Sexual Activity   Alcohol use: No   Drug use: No   Sexual activity: Yes    Birth control/protection: None  Other Topics Concern   Not on file  Social History Narrative   Not on file   Social Drivers of Health   Financial Resource Strain: Not on file  Food Insecurity: No Food Insecurity (04/01/2023)   Hunger Vital Sign    Worried About Running Out of Food in the Last Year: Never true    Ran Out of Food in the Last Year: Never true  Transportation Needs: No Transportation Needs (04/01/2023)   PRAPARE - Administrator, Civil Service (Medical): No    Lack of Transportation (Non-Medical): No  Physical Activity: Not on file  Stress: Not on file  Social Connections: Not on file  Intimate Partner Violence: Not At Risk (04/01/2023)   Humiliation, Afraid, Rape, and Kick questionnaire    Fear of Current or Ex-Partner: No    Emotionally Abused: No    Physically Abused: No    Sexually Abused: No      Review of Systems  Constitutional:  Negative for chills, fatigue and unexpected weight change.  HENT:  Negative for congestion, postnasal drip, rhinorrhea, sneezing and sore throat.   Respiratory: Negative.  Negative for cough, chest tightness, shortness of breath and wheezing.   Cardiovascular: Negative.  Negative for chest pain and palpitations.  Gastrointestinal: Negative.  Negative for abdominal pain, constipation, diarrhea, nausea and vomiting.   Musculoskeletal:  Positive for arthralgias, back pain and myalgias. Negative for joint swelling and neck pain.  Skin:  Negative for rash.  Neurological: Negative.  Negative for tremors, numbness and headaches.  Psychiatric/Behavioral:  Negative for behavioral problems (Depression), sleep disturbance and suicidal ideas. The patient is not nervous/anxious.     Vital Signs: BP 136/82 Comment: 146/90  Pulse 86   Temp 98.1 F (36.7 C)   Resp 16   Ht 5\' 4"  (1.626 m)   Wt 202 lb (91.6 kg)  LMP 01/02/2016   SpO2 99%   BMI 34.67 kg/m    Physical Exam Vitals reviewed.  Constitutional:      General: She is not in acute distress.    Appearance: Normal appearance. She is obese. She is not ill-appearing.  HENT:     Head: Normocephalic and atraumatic.  Eyes:     Pupils: Pupils are equal, round, and reactive to light.  Cardiovascular:     Rate and Rhythm: Normal rate and regular rhythm.  Pulmonary:     Effort: Pulmonary effort is normal. No respiratory distress.  Neurological:     Mental Status: She is alert and oriented to person, place, and time.  Psychiatric:        Mood and Affect: Mood normal.        Behavior: Behavior normal.        Assessment/Plan: 1. Type 2 diabetes mellitus with diabetic peripheral angiopathy without gangrene, without long-term current use of insulin (HCC) (Primary) No changes. A1c is stable. Continue diet and lifestyle modifications.  2. Hyperlipidemia associated with type 2 diabetes mellitus (HCC) Increased rosuvastatin frequent from every other day to now taking it every day. Continue fish oil supplement as well.  - rosuvastatin (CRESTOR) 5 MG tablet; Take 1 tablet (5 mg total) by mouth daily.  Dispense: 90 tablet; Refill: 2 - Omega-3 Fatty Acids (FISH OIL) 1000 MG CAPS; Take 1 capsule (1,000 mg total) by mouth at bedtime.  Dispense: 90 capsule; Refill: 3  3. Essential hypertension, benign Stable, continue current medications as prescribed.   4.  Preoperative clearance Ok for procedure. Hold aspirin 5 days before and 2 days after. Hold all medications except carvedilol on the day of the procedure   General Counseling: zelina jimerson understanding of the findings of todays visit and agrees with plan of treatment. I have discussed any further diagnostic evaluation that may be needed or ordered today. We also reviewed her medications today. she has been encouraged to call the office with any questions or concerns that should arise related to todays visit.    No orders of the defined types were placed in this encounter.   Meds ordered this encounter  Medications   rosuvastatin (CRESTOR) 5 MG tablet    Sig: Take 1 tablet (5 mg total) by mouth daily.    Dispense:  90 tablet    Refill:  2    Note increase in frequency to every day. Fill new script thanks.   Omega-3 Fatty Acids (FISH OIL) 1000 MG CAPS    Sig: Take 1 capsule (1,000 mg total) by mouth at bedtime.    Dispense:  90 capsule    Refill:  3    Return in about 4 months (around 02/10/2024) for F/U, Payeton Germani PCP.   Total time spent:30 Minutes Time spent includes review of chart, medications, test results, and follow up plan with the patient.   [place] Controlled Substance Database was reviewed by me.  This patient was seen by [name], FNP-C in collaboration with Dr. Beverely Risen as a part of collaborative care agreement.   [employer] R. Tedd Sias, MSN, FNP-C Internal medicine

## 2023-10-11 NOTE — Telephone Encounter (Signed)
 Please review

## 2023-10-13 ENCOUNTER — Telehealth: Payer: Self-pay | Admitting: *Deleted

## 2023-10-13 ENCOUNTER — Telehealth: Payer: Self-pay | Admitting: Nurse Practitioner

## 2023-10-13 NOTE — Telephone Encounter (Signed)
 Joylene Grapes, NP   10/01/23  8:41 AM  Note   Patient Name: Kathleen [name]  DOB: 03-Aug-1965 MRN: [id number]   Primary Cardiologist: [name], MD   Chart reviewed as part of pre-operative protocol coverage. Given past medical history and time since last visit, based on ACC/AHA guidelines, [name] is at acceptable risk for the planned procedure without further cardiovascular testing.    Patient was seen by Dr. Kirke Corin on 08/19/2023 and was doing well.  She was cleared for surgery at that time. Per Dr. Kirke Corin, "She has no cardiac symptoms at the present time, her functional capacity is greater than 4 METS and her EKG is normal. Thus, she does not require any ischemic cardiac evaluation before surgery. She is at low risk for cardiovascular complications."   I will route this recommendation to the requesting party via Epic fax function and remove from pre-op pool.   Please call with questions.   Joylene Grapes, NP 10/01/2023, 8:40 AM

## 2023-10-13 NOTE — Telephone Encounter (Signed)
 07/20/2022-current medical records uploaded to [facility]

## 2023-10-13 NOTE — Telephone Encounter (Signed)
 Also received clearance from  Alyssa Abernathy,NP   Patient is clear for procedure  Patient should stop aspirin 81 mg for 5 days before procedure and restart 2 days after.  Noted. Patient also acknowledges to hold all medications except carvedilol on procedure day.

## 2023-10-19 ENCOUNTER — Ambulatory Visit
Admission: RE | Admit: 2023-10-19 | Discharge: 2023-10-19 | Disposition: A | Discharge status: Home / Self Care | Source: Ambulatory Visit | Attending: Nurse Practitioner | Admitting: Nurse Practitioner

## 2023-10-19 DIAGNOSIS — N6002 Solitary cyst of left breast: Secondary | ICD-10-CM | POA: Diagnosis present

## 2023-10-19 DIAGNOSIS — N63 Unspecified lump in unspecified breast: Secondary | ICD-10-CM | POA: Diagnosis present

## 2023-10-19 DIAGNOSIS — R928 Other abnormal and inconclusive findings on diagnostic imaging of breast: Secondary | ICD-10-CM

## 2023-10-19 HISTORY — PX: BREAST CYST ASPIRATION: SHX578

## 2023-10-19 MED ORDER — LIDOCAINE HCL 1 % IJ SOLN
5.0000 mL | Freq: Once | INTRAMUSCULAR | Status: AC
  Administered 2023-10-19: 5 mL
  Filled 2023-10-19: qty 5

## 2023-10-19 NOTE — Telephone Encounter (Signed)
 Spoken to patient and remind her to stop her aspirin   Patient verbalized understanding.

## 2023-10-25 ENCOUNTER — Encounter: Payer: Self-pay | Admitting: Gastroenterology

## 2023-10-25 ENCOUNTER — Ambulatory Visit

## 2023-10-25 ENCOUNTER — Ambulatory Visit
Admission: RE | Admit: 2023-10-25 | Discharge: 2023-10-25 | Disposition: A | Discharge status: Home / Self Care | Source: Ambulatory Visit | Attending: Gastroenterology | Admitting: Gastroenterology

## 2023-10-25 ENCOUNTER — Encounter
Admission: RE | Disposition: A | Discharge status: Home / Self Care | Payer: Self-pay | Source: Ambulatory Visit | Attending: Gastroenterology

## 2023-10-25 DIAGNOSIS — K640 First degree hemorrhoids: Secondary | ICD-10-CM | POA: Diagnosis not present

## 2023-10-25 DIAGNOSIS — K635 Polyp of colon: Secondary | ICD-10-CM | POA: Diagnosis not present

## 2023-10-25 DIAGNOSIS — I119 Hypertensive heart disease without heart failure: Secondary | ICD-10-CM | POA: Insufficient documentation

## 2023-10-25 DIAGNOSIS — I739 Peripheral vascular disease, unspecified: Secondary | ICD-10-CM | POA: Insufficient documentation

## 2023-10-25 DIAGNOSIS — K5730 Diverticulosis of large intestine without perforation or abscess without bleeding: Secondary | ICD-10-CM | POA: Diagnosis not present

## 2023-10-25 DIAGNOSIS — R195 Other fecal abnormalities: Secondary | ICD-10-CM

## 2023-10-25 DIAGNOSIS — G4730 Sleep apnea, unspecified: Secondary | ICD-10-CM | POA: Diagnosis not present

## 2023-10-25 DIAGNOSIS — J449 Chronic obstructive pulmonary disease, unspecified: Secondary | ICD-10-CM | POA: Insufficient documentation

## 2023-10-25 DIAGNOSIS — Z1211 Encounter for screening for malignant neoplasm of colon: Secondary | ICD-10-CM | POA: Diagnosis not present

## 2023-10-25 DIAGNOSIS — F17210 Nicotine dependence, cigarettes, uncomplicated: Secondary | ICD-10-CM | POA: Diagnosis not present

## 2023-10-25 HISTORY — PX: COLONOSCOPY: SHX5424

## 2023-10-25 SURGERY — COLONOSCOPY
Anesthesia: General

## 2023-10-25 MED ORDER — PROPOFOL 1000 MG/100ML IV EMUL
INTRAVENOUS | Status: AC
  Filled 2023-10-25: qty 100

## 2023-10-25 MED ORDER — PROPOFOL 500 MG/50ML IV EMUL
INTRAVENOUS | Status: DC | PRN
  Administered 2023-10-25: 150 ug/kg/min via INTRAVENOUS
  Administered 2023-10-25: 100 mg via INTRAVENOUS

## 2023-10-25 MED ORDER — SODIUM CHLORIDE 0.9 % IV SOLN: INTRAVENOUS | Status: DC

## 2023-10-25 NOTE — Transfer of Care (Signed)
 Immediate Anesthesia Transfer of Care Note  Patient: Kathleen [name]  Procedure(s) Performed: COLONOSCOPY  Patient Location: PACU  Anesthesia Type:General  Level of Consciousness: drowsy  Airway & Oxygen Therapy: Patient Spontanous Breathing  Post-op Assessment: Report given to RN and Post -op Vital signs reviewed and stable  Post vital signs: Reviewed and stable  Last Vitals:  Vitals Value Taken Time  BP 135/106 10/25/23 0827  Temp    Pulse 83 10/25/23 0828  Resp 25 10/25/23 0828  SpO2 96 % 10/25/23 0828  Vitals shown include unfiled device data.  Last Pain:  Vitals:   10/25/23 0738  TempSrc: Temporal         Complications: There were no known notable events for this encounter.

## 2023-10-25 NOTE — Op Note (Signed)
 [facility] Gastroenterology Patient Name: Genowefa Morga Procedure Date: 10/25/2023 7:17 AM MRN: [id number] Account #: [id number] Date of Birth: 05-01-1966 Admit Type: Outpatient Age: 58 Room: [facility] ENDO ROOM 4 Gender: Female Note Status: Finalized Instrument Name: Prentice Docker [id number] Procedure:             Colonoscopy Indications:           Positive Cologuard test Providers:             Midge Minium MD, MD Referring MD:          [name] (Referring MD) Medicines:             Propofol per Anesthesia Complications:         No immediate complications. Procedure:             Pre-Anesthesia Assessment:                        - Prior to the procedure, a History and Physical was                         performed, and patient medications and allergies were                         reviewed. The patient's tolerance of previous                         anesthesia was also reviewed. The risks and benefits                         of the procedure and the sedation options and risks                         were discussed with the patient. All questions were                         answered, and informed consent was obtained. Prior                         Anticoagulants: The patient has taken no anticoagulant                         or antiplatelet agents. ASA Grade Assessment: II - A                         patient with mild systemic disease. After reviewing                         the risks and benefits, the patient was deemed in                         satisfactory condition to undergo the procedure.                        After obtaining informed consent, the colonoscope was                         passed under direct vision. Throughout the procedure,  the patient's blood pressure, pulse, and oxygen                         saturations were monitored continuously. The                         Colonoscope was introduced through the anus and                          advanced to the the cecum, identified by appendiceal                         orifice and ileocecal valve. The colonoscopy was                         performed without difficulty. The patient tolerated                         the procedure well. The quality of the bowel                         preparation was excellent. Findings:      The perianal and digital rectal examinations were normal.      A 4 mm polyp was found in the sigmoid colon. The polyp was sessile. The       polyp was removed with a cold snare. Resection and retrieval were       complete.      Multiple small-mouthed diverticula were found in the sigmoid colon.      Non-bleeding internal hemorrhoids were found during retroflexion. The       hemorrhoids were Grade I (internal hemorrhoids that do not prolapse). Impression:            - One 4 mm polyp in the sigmoid colon, removed with a                         cold snare. Resected and retrieved.                        - Diverticulosis in the sigmoid colon.                        - Non-bleeding internal hemorrhoids. Recommendation:        - Discharge patient to home.                        - Resume previous diet.                        - Continue present medications.                        - If the pathology report reveals adenomatous tissue,                         then repeat the colonoscopy for surveillance in 7                         years. Procedure Code(s):     --- Professional ---  44034, Colonoscopy, flexible; with removal of                         tumor(s), polyp(s), or other lesion(s) by snare                         technique Diagnosis Code(s):     --- Professional ---                        R19.5, Other fecal abnormalities                        D12.5, Benign neoplasm of sigmoid colon CPT copyright 2022 American Medical Association. All rights reserved. The codes documented in this report are preliminary and upon coder review  may  be revised to meet current compliance requirements. Midge Minium MD, MD 10/25/2023 8:23:34 AM This report has been signed electronically. Number of Addenda: 0 Note Initiated On: 10/25/2023 7:17 AM Scope Withdrawal Time: 0 hours 8 minutes 6 seconds  Total Procedure Duration: 0 hours 12 minutes 28 seconds  Estimated Blood Loss:  Estimated blood loss: none.      [facility]

## 2023-10-25 NOTE — Anesthesia Preprocedure Evaluation (Signed)
 Anesthesia Evaluation  Patient identified by MRN, date of birth, ID band Patient awake    Reviewed: Allergy & Precautions, NPO status , Patient's Chart, lab work & pertinent test results  Airway Mallampati: III  TM Distance: >3 FB Neck ROM: full    Dental  (+) Chipped   Pulmonary sleep apnea , COPD, Current Smoker   Pulmonary exam normal        Cardiovascular hypertension, + Peripheral Vascular Disease  Normal cardiovascular exam     Neuro/Psych negative neurological ROS  negative psych ROS   GI/Hepatic negative GI ROS, Neg liver ROS,,,  Endo/Other  negative endocrine ROS    Renal/GU negative Renal ROS  negative genitourinary   Musculoskeletal   Abdominal   Peds  Hematology negative hematology ROS (+)   Anesthesia Other Findings Past Medical History: No date: Hypertension No date: Hypertensive heart disease without heart failure No date: PAD (peripheral artery disease) (HCC) No date: Rheumatoid arthritis (HCC)  Past Surgical History: 10/19/2023: BREAST CYST ASPIRATION; Left No date: CESAREAN SECTION     Comment:  3 cesarean sections 09/14/2023: HYSTEROSCOPY WITH D & C; N/A     Comment:  Procedure: DILATATION AND CURETTAGE /HYSTEROSCOPY AND               POLYP RESECTION;  Surgeon: Warden Fillers, MD;                Location: [facility] OR;  Service: Gynecology;  Laterality: N/A;  BMI    Body Mass Index: 33.88 kg/m      Reproductive/Obstetrics negative OB ROS                             Anesthesia Physical Anesthesia Plan  ASA: 3  Anesthesia Plan: General   Post-op Pain Management: Minimal or no pain anticipated   Induction: Intravenous  PONV Risk Score and Plan: 3 and Propofol infusion, TIVA and Ondansetron  Airway Management Planned: Nasal Cannula  Additional Equipment: None  Intra-op Plan:   Post-operative Plan:   Informed Consent: I have reviewed the patients  History and Physical, chart, labs and discussed the procedure including the risks, benefits and alternatives for the proposed anesthesia with the patient or authorized representative who has indicated his/her understanding and acceptance.     Dental advisory given  Plan Discussed with: CRNA and Surgeon  Anesthesia Plan Comments: (Discussed risks of anesthesia with patient, including possibility of difficulty with spontaneous ventilation under anesthesia necessitating airway intervention, PONV, and rare risks such as cardiac or respiratory or neurological events, and allergic reactions. Discussed the role of CRNA in patient's perioperative care. Patient understands.)       Anesthesia Quick Evaluation

## 2023-10-25 NOTE — H&P (Signed)
 Midge Minium, MD [facility] [address] [place], [place] [address] Phone:[phone] Fax : [phone]  Primary Care Physician:  [name], NP Primary Gastroenterologist:  Dr. Servando Snare  Pre-Procedure History & Physical: HPI:  Kathleen [name] is a 58 y.o. female is here for an colonoscopy.   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 Authorizing Provider  aspirin EC 81 MG tablet Take 1 tablet (81 mg total) by mouth daily. Swallow whole. 03/28/21  Yes Pricilla Loveless, MD  carvedilol (COREG) 25 MG tablet Take 1 tablet by mouth twice daily 07/30/23  Yes Abernathy, Alyssa, NP  cholecalciferol (VITAMIN D3) 25 MCG (1000 UNIT) tablet Take 1,000 Units by mouth daily in the afternoon.   Yes [provider]  Collagen-Vitamin C 1000-10 MG TABS Take 1 tablet by mouth daily.   Yes [provider]  Cyanocobalamin (VITAMIN B12 PO) Take 1 tablet by mouth daily.   Yes [provider]  ferrous sulfate 325 (65 FE) MG tablet Take 325 mg by mouth daily with breakfast.   Yes [provider]  Misc Natural Products (OSTEO BI-FLEX/5-LOXIN ADVANCED PO) Take 1 tablet by mouth daily.   Yes [provider]  Multiple Vitamins-Minerals (ONE-A-DAY ENERGY PO) Take 1 tablet by mouth daily.   Yes [provider]  Olmesartan-amLODIPine-HCTZ 40-5-12.5 MG TABS Take 1 tablet by mouth once daily 06/24/23  Yes  Iran Ouch, MD  oxyCODONE (OXY IR/ROXICODONE) 5 MG immediate release tablet Take 1 tablet (5 mg total) by mouth 2 (two) times daily as needed for moderate pain (pain score 4-6) or severe pain (pain score 7-10). 09/14/23  Yes Warden Fillers, MD  rosuvastatin (CRESTOR) 5 MG tablet Take 1 tablet (5 mg total) by mouth daily. 10/11/23  Yes Abernathy, Arlyss Repress, NP  tiZANidine (ZANAFLEX) 4 MG tablet Take 1-2 tablets (4-8 mg total) by mouth every 6 (six) hours as needed for muscle spasms. 07/05/23  Yes Abernathy, Arlyss Repress, NP  acetaminophen (TYLENOL) 500 MG tablet Take 2 tablets (1,000 mg total) by mouth every 6 (six) hours as needed. 04/02/23   Dorcas Carrow, MD  lidocaine (LIDODERM) 5 % Place 1 patch onto the skin daily. To affected area. Remove & Discard patch within 12 hours or as directed by provider. Patient taking differently: Place 1 patch onto the skin daily as needed (pain). To affected area. Remove & Discard patch within 12 hours or as directed by provider. 07/05/23   [name], NP  Menthol, Topical Analgesic, (BIOFREEZE ROLL-ON EX) Apply 1 Application topically as needed (knee pain).    [provider]  Omega-3 Fatty Acids (FISH OIL) 1000 MG CAPS Take 1 capsule (1,000 mg total) by mouth at bedtime. 10/11/23   [name], NP    Allergies as of 09/29/2023 - Review Complete 09/14/2023  Allergen Reaction Noted   Bee venom Anaphylaxis and Swelling 02/01/2012   Penicillins Itching 03/28/2021  Family History  Problem Relation Age of Onset   Cancer Mother        Female   Hypertension Mother    Breast cancer Neg Hx     Social History   Socioeconomic History   Marital status: Married    Spouse name: Not on file   Number of children: Not on file   Years of education: Not on file   Highest education level: Not on file  Occupational History   Not on file  Tobacco Use   Smoking status: Every Day    Current packs/day: 0.50    Types: Cigarettes   Smokeless  tobacco: Never  Vaping Use   Vaping status: Former  Substance and Sexual Activity   Alcohol use: No   Drug use: No   Sexual activity: Yes    Birth control/protection: None  Other Topics Concern   Not on file  Social History Narrative   Not on file   Social Drivers of Health   Financial Resource Strain: Not on file  Food Insecurity: No Food Insecurity (04/01/2023)   Hunger Vital Sign    Worried About Running Out of Food in the Last Year: Never true    Ran Out of Food in the Last Year: Never true  Transportation Needs: No Transportation Needs (04/01/2023)   PRAPARE - Administrator, Civil Service (Medical): No    Lack of Transportation (Non-Medical): No  Physical Activity: Not on file  Stress: Not on file  Social Connections: Not on file  Intimate Partner Violence: Not At Risk (04/01/2023)   Humiliation, Afraid, Rape, and Kick questionnaire    Fear of Current or Ex-Partner: No    Emotionally Abused: No    Physically Abused: No    Sexually Abused: No    Review of Systems: See HPI, otherwise negative ROS  Physical Exam: BP 113/75   Pulse 90   Temp (!) 96.6 F (35.9 C) (Temporal)   Resp 16   Wt 89.5 kg   LMP 01/02/2016   SpO2 99%   BMI 33.88 kg/m  General:   Alert,  pleasant and cooperative in NAD Head:  Normocephalic and atraumatic. Neck:  Supple; no masses or thyromegaly. Lungs:  Clear throughout to auscultation.    Heart:  Regular rate and rhythm. Abdomen:  Soft, nontender and nondistended. Normal bowel sounds, without guarding, and without rebound.   Neurologic:  Alert and  oriented x4;  grossly normal neurologically.  Impression/Plan: Kathleen [name] is here for an colonoscopy to be performed for positive cologard  Risks, benefits, limitations, and alternatives regarding  colonoscopy have been reviewed with the patient.  Questions have been answered.  All parties agreeable.   Midge Minium, MD  10/25/2023, 7:59 AM

## 2023-10-25 NOTE — Anesthesia Postprocedure Evaluation (Signed)
 Anesthesia Post Note  Patient: Kathleen [name]  Procedure(s) Performed: COLONOSCOPY  Patient location during evaluation: Endoscopy Anesthesia Type: General Level of consciousness: awake and alert Pain management: pain level controlled Vital Signs Assessment: post-procedure vital signs reviewed and stable Respiratory status: spontaneous breathing, nonlabored ventilation, respiratory function stable and patient connected to nasal cannula oxygen Cardiovascular status: blood pressure returned to baseline and stable Postop Assessment: no apparent nausea or vomiting Anesthetic complications: no  There were no known notable events for this encounter.   Last Vitals:  Vitals:   10/25/23 0738 10/25/23 0826  BP: 113/75 (!) 135/106  Pulse: 90 83  Resp: 16 (!) 25  Temp: (!) 35.9 C 36.4 C  SpO2: 99% 93%    Last Pain:  Vitals:   10/25/23 0846  TempSrc:   PainSc: 0-No pain                 Stephanie Coup

## 2023-10-26 ENCOUNTER — Encounter: Payer: Self-pay | Admitting: Gastroenterology

## 2023-10-26 LAB — SURGICAL PATHOLOGY

## 2023-10-27 ENCOUNTER — Encounter: Payer: Self-pay | Admitting: Gastroenterology

## 2023-12-06 ENCOUNTER — Ambulatory Visit: Payer: 59 | Admitting: Nurse Practitioner

## 2023-12-07 ENCOUNTER — Encounter (INDEPENDENT_AMBULATORY_CARE_PROVIDER_SITE_OTHER): Payer: Self-pay

## 2023-12-16 ENCOUNTER — Ambulatory Visit: Payer: 59 | Admitting: Physician Assistant

## 2023-12-28 ENCOUNTER — Telehealth: Payer: Self-pay | Admitting: Nurse Practitioner

## 2023-12-28 NOTE — Telephone Encounter (Signed)
 Lvm & sent message to r/s cancelled follow up appointment for late July-Toni

## 2024-01-31 ENCOUNTER — Encounter: Payer: Self-pay | Admitting: Physician Assistant

## 2024-01-31 ENCOUNTER — Ambulatory Visit (INDEPENDENT_AMBULATORY_CARE_PROVIDER_SITE_OTHER): Admitting: Physician Assistant

## 2024-01-31 VITALS — BP 111/92 | HR 92 | Temp 98.2°F | Resp 16 | Ht 64.0 in | Wt 203.0 lb

## 2024-01-31 DIAGNOSIS — F17200 Nicotine dependence, unspecified, uncomplicated: Secondary | ICD-10-CM | POA: Diagnosis not present

## 2024-01-31 DIAGNOSIS — G4733 Obstructive sleep apnea (adult) (pediatric): Secondary | ICD-10-CM

## 2024-01-31 NOTE — Progress Notes (Signed)
 [facility] [address] [place], [place] [address]  Pulmonary Sleep Medicine   Office Visit Note  Patient Name: Kathleen [name] DOB: [birth year] MRN [id number]  Date of Service: 01/31/2024  Complaints/HPI: Pt is here for routine pulmonary follow up. Trying to cut back on smoking, at about 1/2ppd and continuing to work on reducing. Works night shift and most smoking is in evening during shift. Denies any breathing concerns. She does have OSA, but declined CPAP and doesn't think she could sleep with this as she sleeps on her stomach. Discussed all the consequences of untreated OSA and different mask/headgear options she could try, but she declines for now.  ROS  General: (-) fever, (-) chills, (-) night sweats, (-) weakness Skin: (-) rashes, (-) itching,. Eyes: (-) visual changes, (-) redness, (-) itching. Nose and Sinuses: (-) nasal stuffiness or itchiness, (-) postnasal drip, (-) nosebleeds, (-) sinus trouble. Mouth and Throat: (-) sore throat, (-) hoarseness. Neck: (-) swollen glands, (-) enlarged thyroid, (-) neck pain. Respiratory: - cough, (-) bloody sputum, - shortness of breath, - wheezing. Cardiovascular: - ankle swelling, (-) chest pain. Lymphatic: (-) lymph node enlargement. Neurologic: (-) numbness, (-) tingling. Psychiatric: (-) anxiety, (-) depression   Current Medication: Outpatient Encounter Medications as of 01/31/2024  Medication Sig Note   acetaminophen  (TYLENOL ) 500 MG tablet Take 2 tablets (1,000 mg total) by mouth every 6 (six) hours as needed.    aspirin  EC 81 MG tablet Take 1 tablet (81 mg total) by mouth daily. Swallow whole.    carvedilol  (COREG ) 25 MG tablet Take 1 tablet by mouth twice daily    cholecalciferol (VITAMIN D3) 25 MCG (1000 UNIT) tablet Take 1,000 Units by mouth daily in the afternoon.    Collagen-Vitamin C 1000-10 MG TABS Take 1 tablet by mouth daily.    Cyanocobalamin  (VITAMIN B12 PO) Take 1 tablet by mouth daily.    ferrous  sulfate 325 (65 FE) MG tablet Take 325 mg by mouth daily with breakfast.    lidocaine  (LIDODERM ) 5 % Place 1 patch onto the skin daily. To affected area. Remove & Discard patch within 12 hours or as directed by provider. (Patient taking differently: Place 1 patch onto the skin daily as needed (pain). To affected area. Remove & Discard patch within 12 hours or as directed by provider.)    Menthol, Topical Analgesic, (BIOFREEZE ROLL-ON EX) Apply 1 Application topically as needed (knee pain).    Misc Natural Products (OSTEO BI-FLEX/5-LOXIN ADVANCED PO) Take 1 tablet by mouth daily.    Multiple Vitamins-Minerals (ONE-A-DAY ENERGY PO) Take 1 tablet by mouth daily.    Olmesartan -amLODIPine -HCTZ 40-5-12.5 MG TABS Take 1 tablet by mouth once daily 09/10/2023: Bedtime    Omega-3 Fatty Acids (FISH OIL ) 1000 MG CAPS Take 1 capsule (1,000 mg total) by mouth at bedtime.    oxyCODONE  (OXY IR/ROXICODONE ) 5 MG immediate release tablet Take 1 tablet (5 mg total) by mouth 2 (two) times daily as needed for moderate pain (pain score 4-6) or severe pain (pain score 7-10).    rosuvastatin  (CRESTOR ) 5 MG tablet Take 1 tablet (5 mg total) by mouth daily.    tiZANidine  (ZANAFLEX ) 4 MG tablet Take 1-2 tablets (4-8 mg total) by mouth every 6 (six) hours as needed for muscle spasms.    No facility-administered encounter medications on file as of 01/31/2024.    Surgical History: Past Surgical History:  Procedure Laterality Date   BREAST CYST ASPIRATION Left 10/19/2023   CESAREAN SECTION  3 cesarean sections   COLONOSCOPY N/A 10/25/2023   Procedure: COLONOSCOPY;  Surgeon: Jinny Carmine, MD;  Location: [facility] ENDOSCOPY;  Service: Endoscopy;  Laterality: N/A;   HYSTEROSCOPY WITH D & C N/A 09/14/2023   Procedure: DILATATION AND CURETTAGE /HYSTEROSCOPY AND POLYP RESECTION;  Surgeon: Zina Jerilynn LABOR, MD;  Location: [facility] OR;  Service: Gynecology;  Laterality: N/A;    Medical History: Past Medical History:  Diagnosis Date    Hypertension    Hypertensive heart disease without heart failure    PAD (peripheral artery disease) (HCC)    Rheumatoid arthritis (HCC)     Family History: Family History  Problem Relation Age of Onset   Cancer Mother        Female   Hypertension Mother    Breast cancer Neg Hx     Social History: Social History   Socioeconomic History   Marital status: Married    Spouse name: Not on file   Number of children: Not on file   Years of education: Not on file   Highest education level: Not on file  Occupational History   Not on file  Tobacco Use   Smoking status: Every Day    Current packs/day: 0.50    Types: Cigarettes   Smokeless tobacco: Never   Tobacco comments:    1/2 PPD  Vaping Use   Vaping status: Former  Substance and Sexual Activity   Alcohol use: No   Drug use: No   Sexual activity: Yes    Birth control/protection: None  Other Topics Concern   Not on file  Social History Narrative   Not on file   Social Drivers of Health   Financial Resource Strain: Not on file  Food Insecurity: No Food Insecurity (04/01/2023)   Hunger Vital Sign    Worried About Running Out of Food in the Last Year: Never true    Ran Out of Food in the Last Year: Never true  Transportation Needs: No Transportation Needs (04/01/2023)   PRAPARE - Administrator, Civil Service (Medical): No    Lack of Transportation (Non-Medical): No  Physical Activity: Not on file  Stress: Not on file  Social Connections: Not on file  Intimate Partner Violence: Not At Risk (04/01/2023)   Humiliation, Afraid, Rape, and Kick questionnaire    Fear of Current or Ex-Partner: No    Emotionally Abused: No    Physically Abused: No    Sexually Abused: No    Vital Signs: Blood pressure (!) 111/92, pulse 92, temperature 98.2 F (36.8 C), resp. rate 16, height 5' 4 (1.626 m), weight 203 lb (92.1 kg), last menstrual period 01/02/2016, SpO2 100%.  Examination: General Appearance: The patient is  well-developed, well-nourished, and in no distress. Skin: Gross inspection of skin unremarkable. Head: normocephalic, no gross deformities. Eyes: no gross deformities noted. ENT: ears appear grossly normal no exudates. Neck: Supple. No thyromegaly. No LAD. Respiratory: lungs clear to auscultation. Cardiovascular: Normal S1 and S2 without murmur or rub. Extremities: No cyanosis. pulses are equal. Neurologic: Alert and oriented. No involuntary movements.  LABS: No results found for this or any previous visit (from the past 2160 hours).  Radiology: No results found.  No results found.  No results found.    Assessment and Plan: Patient Active Problem List   Diagnosis Date Noted   Positive colorectal cancer screening using Cologuard test 10/25/2023   Dysfunctional uterine bleeding 06/04/2023   Thickened endometrium 04/21/2023   Intractable pain 03/31/2023   Obstructive  chronic bronchitis without exacerbation (HCC) 03/31/2023   PAD (peripheral artery disease) (HCC) 03/31/2023   Type 2 diabetes mellitus with diabetic peripheral angiopathy without gangrene, without long-term current use of insulin (HCC) 03/31/2023   Chronic congestive heart failure (HCC) 03/22/2023   Overuse syndrome of low back 03/22/2023   Pain in limb 04/14/2022   Rheumatoid arthritis (HCC) 12/01/2021   OSA (obstructive sleep apnea) 06/06/2021   Obesity, morbid (HCC) 06/06/2021   Obesity (BMI 30-39.9) 05/05/2021   Atypical pneumonia 04/16/2021   OA (osteoarthritis) of knee 05/02/2014   Essential hypertension, benign 04/13/2014   Right knee pain 04/13/2014   Tobacco use disorder 04/13/2014   Left pyosalpinx 02/04/2014    1. OSA (obstructive sleep apnea) (Primary) Encouraged to move forward with treatment of OSA, but pt declines for now. Discussed consequences of going untreated. She is going to follow up prn if she changes her mind or wants to re-evaluate.  2. Tobacco use disorder Continue to work on  smoking cessation. Will continue to discuss with PCP    General Counseling: I have discussed the findings of the evaluation and examination with Aleck.  I have also discussed any further diagnostic evaluation thatmay be needed or ordered today. Vitalia verbalizes understanding of the findings of todays visit. We also reviewed her medications today and discussed drug interactions and side effects including but not limited excessive drowsiness and altered mental states. We also discussed that there is always a risk not just to her but also people around her. she has been encouraged to call the office with any questions or concerns that should arise related to todays visit.  No orders of the defined types were placed in this encounter.    Time spent: 25  I have personally obtained a history, examined the patient, evaluated laboratory and imaging results, formulated the assessment and plan and placed orders. This patient was seen by Tinnie Pro, PA-C in collaboration with Dr. Elfreda Bathe as a part of collaborative care agreement.     Elfreda DELENA Bathe, MD [facility] Pulmonary and Critical Care Sleep medicine

## 2024-02-16 ENCOUNTER — Encounter: Payer: Self-pay | Admitting: Nurse Practitioner

## 2024-02-16 ENCOUNTER — Ambulatory Visit (INDEPENDENT_AMBULATORY_CARE_PROVIDER_SITE_OTHER): Admitting: Nurse Practitioner

## 2024-02-16 VITALS — BP 138/85 | HR 81 | Temp 98.2°F | Resp 16 | Ht 64.0 in | Wt 204.0 lb

## 2024-02-16 DIAGNOSIS — E1169 Type 2 diabetes mellitus with other specified complication: Secondary | ICD-10-CM | POA: Diagnosis not present

## 2024-02-16 DIAGNOSIS — E785 Hyperlipidemia, unspecified: Secondary | ICD-10-CM

## 2024-02-16 DIAGNOSIS — M25561 Pain in right knee: Secondary | ICD-10-CM | POA: Diagnosis not present

## 2024-02-16 DIAGNOSIS — E1151 Type 2 diabetes mellitus with diabetic peripheral angiopathy without gangrene: Secondary | ICD-10-CM

## 2024-02-16 DIAGNOSIS — I10 Essential (primary) hypertension: Secondary | ICD-10-CM | POA: Diagnosis not present

## 2024-02-16 DIAGNOSIS — G8929 Other chronic pain: Secondary | ICD-10-CM

## 2024-02-16 MED ORDER — CARVEDILOL 25 MG PO TABS: 25.0000 mg | ORAL_TABLET | Freq: Two times a day (BID) | ORAL | 3 refills | Status: AC

## 2024-02-16 MED ORDER — ROSUVASTATIN CALCIUM 5 MG PO TABS: 5.0000 mg | ORAL_TABLET | Freq: Every day | ORAL | 2 refills | Status: AC

## 2024-02-16 MED ORDER — TIZANIDINE HCL 4 MG PO TABS: 4.0000 mg | ORAL_TABLET | Freq: Four times a day (QID) | ORAL | 2 refills | Status: AC | PRN

## 2024-02-16 MED ORDER — OLMESARTAN-AMLODIPINE-HCTZ 40-5-12.5 MG PO TABS: 1.0000 | ORAL_TABLET | Freq: Every day | ORAL | 3 refills | Status: AC

## 2024-02-16 NOTE — Progress Notes (Signed)
 [facility] [address] [place], [place] [address]  Internal MEDICINE  Office Visit Note  Patient Name: Kathleen [name]  [id number]  [id number]  Date of Service: 02/16/2024  Chief Complaint  Patient presents with   Hypertension   Follow-up    HPI Kathleen [name] presents for a follow-up visit for hypertension anxiety, high cholesterol and low vitamin D   Hypertension -- controlled with current medications, due for refills.  Needs note for work due to panic attacks, cannot work on lockdown units.   High cholesterol -- taking OTC fish oil  supplement and rosuvastatin   Low vitamin D  -- taking OTC vitamin D  supplement.     Current Medication: Outpatient Encounter Medications as of 02/16/2024  Medication Sig Note   acetaminophen  (TYLENOL ) 500 MG tablet Take 2 tablets (1,000 mg total) by mouth every 6 (six) hours as needed.    aspirin  EC 81 MG tablet Take 1 tablet (81 mg total) by mouth daily. Swallow whole.    carvedilol  (COREG ) 25 MG tablet Take 1 tablet (25 mg total) by mouth 2 (two) times daily.    cholecalciferol (VITAMIN D3) 25 MCG (1000 UNIT) tablet Take 1,000 Units by mouth daily in the afternoon.    Collagen-Vitamin C 1000-10 MG TABS Take 1 tablet by mouth daily.    Cyanocobalamin  (VITAMIN B12 PO) Take 1 tablet by mouth daily.    ferrous sulfate 325 (65 FE) MG tablet Take 325 mg by mouth daily with breakfast.    lidocaine  (LIDODERM ) 5 % Place 1 patch onto the skin daily. To affected area. Remove & Discard patch within 12 hours or as directed by provider. (Patient taking differently: Place 1 patch onto the skin daily as needed (pain). To affected area. Remove & Discard patch within 12 hours or as directed by provider.)    Menthol, Topical Analgesic, (BIOFREEZE ROLL-ON EX) Apply 1 Application topically as needed (knee pain).    Misc Natural Products (OSTEO BI-FLEX/5-LOXIN ADVANCED PO) Take 1 tablet by mouth daily.    Multiple Vitamins-Minerals (ONE-A-DAY ENERGY PO) Take 1  tablet by mouth daily.    Olmesartan -amLODIPine -HCTZ 40-5-12.5 MG TABS Take 1 tablet by mouth daily.    Omega-3 Fatty Acids (FISH OIL ) 1000 MG CAPS Take 1 capsule (1,000 mg total) by mouth at bedtime.    oxyCODONE  (OXY IR/ROXICODONE ) 5 MG immediate release tablet Take 1 tablet (5 mg total) by mouth 2 (two) times daily as needed for moderate pain (pain score 4-6) or severe pain (pain score 7-10).    rosuvastatin  (CRESTOR ) 5 MG tablet Take 1 tablet (5 mg total) by mouth daily.    tiZANidine  (ZANAFLEX ) 4 MG tablet Take 1-2 tablets (4-8 mg total) by mouth every 6 (six) hours as needed for muscle spasms.    [DISCONTINUED] carvedilol  (COREG ) 25 MG tablet Take 1 tablet by mouth twice daily    [DISCONTINUED] Olmesartan -amLODIPine -HCTZ 40-5-12.5 MG TABS Take 1 tablet by mouth once daily 09/10/2023: Bedtime    [DISCONTINUED] rosuvastatin  (CRESTOR ) 5 MG tablet Take 1 tablet (5 mg total) by mouth daily.    [DISCONTINUED] tiZANidine  (ZANAFLEX ) 4 MG tablet Take 1-2 tablets (4-8 mg total) by mouth every 6 (six) hours as needed for muscle spasms.    No facility-administered encounter medications on file as of 02/16/2024.    Surgical History: Past Surgical History:  Procedure Laterality Date   BREAST CYST ASPIRATION Left 10/19/2023   CESAREAN SECTION     3 cesarean sections   COLONOSCOPY N/A 10/25/2023   Procedure: COLONOSCOPY;  Surgeon: Jinny,  Darren, MD;  Location: [facility] ENDOSCOPY;  Service: Endoscopy;  Laterality: N/A;   HYSTEROSCOPY WITH D & C N/A 09/14/2023   Procedure: DILATATION AND CURETTAGE /HYSTEROSCOPY AND POLYP RESECTION;  Surgeon: Zina Jerilynn LABOR, MD;  Location: [facility] OR;  Service: Gynecology;  Laterality: N/A;    Medical History: Past Medical History:  Diagnosis Date   Hypertension    Hypertensive heart disease without heart failure    PAD (peripheral artery disease) (HCC)    Rheumatoid arthritis (HCC)     Family History: Family History  Problem Relation Age of Onset   Cancer Mother         Female   Hypertension Mother    Breast cancer Neg Hx     Social History   Socioeconomic History   Marital status: Married    Spouse name: Not on file   Number of children: Not on file   Years of education: Not on file   Highest education level: Not on file  Occupational History   Not on file  Tobacco Use   Smoking status: Every Day    Current packs/day: 0.50    Types: Cigarettes   Smokeless tobacco: Never   Tobacco comments:    1/2 PPD  Vaping Use   Vaping status: Former  Substance and Sexual Activity   Alcohol use: No   Drug use: No   Sexual activity: Yes    Birth control/protection: None  Other Topics Concern   Not on file  Social History Narrative   Not on file   Social Drivers of Health   Financial Resource Strain: Not on file  Food Insecurity: No Food Insecurity (04/01/2023)   Hunger Vital Sign    Worried About Running Out of Food in the Last Year: Never true    Ran Out of Food in the Last Year: Never true  Transportation Needs: No Transportation Needs (04/01/2023)   PRAPARE - Administrator, Civil Service (Medical): No    Lack of Transportation (Non-Medical): No  Physical Activity: Not on file  Stress: Not on file  Social Connections: Not on file  Intimate Partner Violence: Not At Risk (04/01/2023)   Humiliation, Afraid, Rape, and Kick questionnaire    Fear of Current or Ex-Partner: No    Emotionally Abused: No    Physically Abused: No    Sexually Abused: No      Review of Systems  Constitutional:  Negative for chills, fatigue and unexpected weight change.  HENT:  Negative for congestion, postnasal drip, rhinorrhea, sneezing and sore throat.   Respiratory: Negative.  Negative for cough, chest tightness, shortness of breath and wheezing.   Cardiovascular: Negative.  Negative for chest pain and palpitations.  Gastrointestinal: Negative.  Negative for abdominal pain, constipation, diarrhea, nausea and vomiting.  Musculoskeletal:  Positive  for arthralgias, back pain and myalgias. Negative for joint swelling and neck pain.  Skin:  Negative for rash.  Neurological: Negative.  Negative for tremors, numbness and headaches.  Psychiatric/Behavioral:  Negative for behavioral problems (Depression), sleep disturbance and suicidal ideas. The patient is not nervous/anxious.     Vital Signs: BP 138/85   Pulse 81   Temp 98.2 F (36.8 C)   Resp 16   Ht 5' 4 (1.626 m)   Wt 204 lb (92.5 kg)   LMP 01/02/2016   SpO2 99%   BMI 35.02 kg/m    Physical Exam Vitals reviewed.  Constitutional:      General: She is not in acute distress.  Appearance: Normal appearance. She is obese. She is not ill-appearing.  HENT:     Head: Normocephalic and atraumatic.  Eyes:     Pupils: Pupils are equal, round, and reactive to light.  Cardiovascular:     Rate and Rhythm: Normal rate and regular rhythm.  Pulmonary:     Effort: Pulmonary effort is normal. No respiratory distress.  Skin:    Capillary Refill: Capillary refill takes less than 2 seconds.  Neurological:     Mental Status: She is alert and oriented to person, place, and time.  Psychiatric:        Mood and Affect: Mood normal.        Behavior: Behavior normal.        Assessment/Plan: 1. Essential hypertension, benign (Primary) Stable, continue medications as prescribe, refills ordered  - Olmesartan -amLODIPine -HCTZ 40-5-12.5 MG TABS; Take 1 tablet by mouth daily.  Dispense: 90 tablet; Refill: 3 - carvedilol  (COREG ) 25 MG tablet; Take 1 tablet (25 mg total) by mouth 2 (two) times daily.  Dispense: 180 tablet; Refill: 3  2. Type 2 diabetes mellitus with diabetic peripheral angiopathy without gangrene, without long-term current use of insulin (HCC) Last A1c is stable, she is diet controlled   3. Hyperlipidemia associated with type 2 diabetes mellitus (HCC) Continue rosuvastatin  as prescribed.  - rosuvastatin  (CRESTOR ) 5 MG tablet; Take 1 tablet (5 mg total) by mouth daily.   Dispense: 90 tablet; Refill: 2  4. Chronic pain of right knee Continue prn tizanidine  as prescribed.  - tiZANidine  (ZANAFLEX ) 4 MG tablet; Take 1-2 tablets (4-8 mg total) by mouth every 6 (six) hours as needed for muscle spasms.  Dispense: 120 tablet; Refill: 2   General Counseling: Kathleen [name] verbalizes understanding of the findings of todays visit and agrees with plan of treatment. I have discussed any further diagnostic evaluation that may be needed or ordered today. We also reviewed her medications today. she has been encouraged to call the office with any questions or concerns that should arise related to todays visit.    No orders of the defined types were placed in this encounter.   Meds ordered this encounter  Medications   rosuvastatin  (CRESTOR ) 5 MG tablet    Sig: Take 1 tablet (5 mg total) by mouth daily.    Dispense:  90 tablet    Refill:  2    Note increase in frequency to every day. Fill new script thanks.   Olmesartan -amLODIPine -HCTZ 40-5-12.5 MG TABS    Sig: Take 1 tablet by mouth daily.    Dispense:  90 tablet    Refill:  3    For future refills   carvedilol  (COREG ) 25 MG tablet    Sig: Take 1 tablet (25 mg total) by mouth 2 (two) times daily.    Dispense:  180 tablet    Refill:  3    For future refills   tiZANidine  (ZANAFLEX ) 4 MG tablet    Sig: Take 1-2 tablets (4-8 mg total) by mouth every 6 (six) hours as needed for muscle spasms.    Dispense:  120 tablet    Refill:  2    Fill new script today, discontinue baclofen     Return for previously scheduled, CPE, Sharnelle Cappelli PCP in february. .   Total time spent:30 Minutes Time spent includes review of chart, medications, test results, and follow up plan with the patient.   [place] Controlled Substance Database was reviewed by me.  This patient was seen by Mardy Maxin, FNP-C in collaboration with  Dr. Sigrid Bathe as a part of collaborative care agreement.   Zaquan Duffner R. Liana, MSN, FNP-C Internal medicine

## 2024-04-10 ENCOUNTER — Telehealth: Payer: Self-pay | Admitting: Nurse Practitioner

## 2024-04-10 NOTE — Telephone Encounter (Signed)
 MR uploaded to [employer]

## 2024-06-21 ENCOUNTER — Other Ambulatory Visit (INDEPENDENT_AMBULATORY_CARE_PROVIDER_SITE_OTHER): Payer: Self-pay | Admitting: Vascular Surgery

## 2024-06-21 DIAGNOSIS — I739 Peripheral vascular disease, unspecified: Secondary | ICD-10-CM

## 2024-06-30 ENCOUNTER — Encounter (INDEPENDENT_AMBULATORY_CARE_PROVIDER_SITE_OTHER): Payer: Self-pay | Admitting: Nurse Practitioner

## 2024-06-30 ENCOUNTER — Other Ambulatory Visit (INDEPENDENT_AMBULATORY_CARE_PROVIDER_SITE_OTHER): Payer: PRIVATE HEALTH INSURANCE

## 2024-06-30 ENCOUNTER — Ambulatory Visit (INDEPENDENT_AMBULATORY_CARE_PROVIDER_SITE_OTHER): Payer: PRIVATE HEALTH INSURANCE | Admitting: Vascular Surgery

## 2024-06-30 VITALS — BP 144/85 | HR 85 | Resp 17 | Ht 64.0 in | Wt 213.4 lb

## 2024-06-30 DIAGNOSIS — M1711 Unilateral primary osteoarthritis, right knee: Secondary | ICD-10-CM | POA: Diagnosis not present

## 2024-06-30 DIAGNOSIS — I739 Peripheral vascular disease, unspecified: Secondary | ICD-10-CM

## 2024-07-03 LAB — VAS US ABI WITH/WO TBI
Left ABI: 1.1
Right ABI: 1.11

## 2024-07-09 ENCOUNTER — Encounter (INDEPENDENT_AMBULATORY_CARE_PROVIDER_SITE_OTHER): Payer: Self-pay | Admitting: Nurse Practitioner

## 2024-07-09 NOTE — Progress Notes (Signed)
 "  Subjective:    Patient ID: Kathleen [name], female    DOB: May 21, 1966, 58 y.o.   MRN: [id number] Chief Complaint  Patient presents with   Follow-up    F/u 1 yrs  ABI    HPI  Discussed the use of AI scribe software for clinical note transcription with the patient, who gave verbal consent to proceed.  History of Present Illness Kathleen [name] is a 58 year old female who presents with persistent knee pain. She was referred by her regular doctor to the vascular clinic to evaluate her leg pain.  She experiences persistent knee pain, primarily localized around the knee and described as a 'band around the knee.' The pain has not been alleviated by previous treatments, including knee braces, compression socks, muscle relaxers, and injections. She has received three injections in the past, which did not provide relief. The pain is exacerbated by prolonged standing or walking, such as during 12-hour shifts at work, leading to significant swelling in the kneecap area. She has since reduced her work hours to 8-hour shifts to manage the pain and swelling.  She has previously consulted with [facility], where she received a knee brace and injections, but these interventions did not alleviate her symptoms. She continues to wear compression socks and a knee brace, which provide some comfort but do not resolve the pain. She has also tried various over-the-counter treatments, including Biofreeze, Vanix oil cream, and heat pads, without significant improvement.  The pain is severe enough that she sometimes feels like she is 'working like a penguin,' waddling to keep weight off the affected knee. She takes fish oil , collagen, and turmeric daily in an effort to manage inflammation and improve joint health. Despite these efforts, she continues to experience significant pain and is concerned about the possibility of surgical intervention.  No significant swelling in other parts of the leg and the pain does not  radiate beyond the knee area. Previous vascular studies showed ABI values of 1.11 on the right and 1.10 on the left, and triphasic tibial waveforms were noted.    Results DIAGNOSTIC Ankle-Brachial Index (ABI): Right leg 1.11, Left leg 1.10 Toe pressure index: Right 1.07, Left 1.01 Tibial waveforms: Triphasic   Review of Systems     Objective:   Physical Exam  Physical Exam    BP (!) 144/85   Pulse 85   Resp 17   Ht 5' 4 (1.626 m)   Wt 213 lb 6.4 oz (96.8 kg)   LMP 01/02/2016   BMI 36.63 kg/m   Past Medical History:  Diagnosis Date   Hypertension    Hypertensive heart disease without heart failure    PAD (peripheral artery disease)    Rheumatoid arthritis (HCC)     Social History   Socioeconomic History   Marital status: Married    Spouse name: Not on file   Number of children: Not on file   Years of education: Not on file   Highest education level: Not on file  Occupational History   Not on file  Tobacco Use   Smoking status: Every Day    Current packs/day: 0.50    Types: Cigarettes   Smokeless tobacco: Never   Tobacco comments:    1/2 PPD  Vaping Use   Vaping status: Former  Substance and Sexual Activity   Alcohol use: No   Drug use: No   Sexual activity: Yes    Birth control/protection: None  Other Topics Concern   Not  on file  Social History Narrative   Not on file   Social Drivers of Health   Tobacco Use: High Risk (06/30/2024)   Patient History    Smoking Tobacco Use: Every Day    Smokeless Tobacco Use: Never    Passive Exposure: Not on file  Financial Resource Strain: Not on file  Food Insecurity: No Food Insecurity (04/01/2023)   Hunger Vital Sign    Worried About Running Out of Food in the Last Year: Never true    Ran Out of Food in the Last Year: Never true  Transportation Needs: No Transportation Needs (04/01/2023)   PRAPARE - Administrator, Civil Service (Medical): No    Lack of Transportation (Non-Medical): No   Physical Activity: Not on file  Stress: Not on file  Social Connections: Not on file  Intimate Partner Violence: Not At Risk (04/01/2023)   Humiliation, Afraid, Rape, and Kick questionnaire    Fear of Current or Ex-Partner: No    Emotionally Abused: No    Physically Abused: No    Sexually Abused: No  Depression (PHQ2-9): Medium Risk (08/23/2023)   Depression (PHQ2-9)    PHQ-2 Score: 9  Alcohol Screen: Low Risk (04/15/2022)   Alcohol Screen    Last Alcohol Screening Score (AUDIT): 0  Housing: Low Risk (04/01/2023)   Housing    Last Housing Risk Score: 0  Utilities: Not At Risk (04/01/2023)   AHC Utilities    Threatened with loss of utilities: No  Health Literacy: Not on file    Past Surgical History:  Procedure Laterality Date   BREAST CYST ASPIRATION Left 10/19/2023   CESAREAN SECTION     3 cesarean sections   COLONOSCOPY N/A 10/25/2023   Procedure: COLONOSCOPY;  Surgeon: Jinny Carmine, MD;  Location: [facility] ENDOSCOPY;  Service: Endoscopy;  Laterality: N/A;   HYSTEROSCOPY WITH D & C N/A 09/14/2023   Procedure: DILATATION AND CURETTAGE /HYSTEROSCOPY AND POLYP RESECTION;  Surgeon: Zina Jerilynn LABOR, MD;  Location: [facility] OR;  Service: Gynecology;  Laterality: N/A;    Family History  Problem Relation Age of Onset   Cancer Mother        Female   Hypertension Mother    Breast cancer Neg Hx     Allergies[1]     Latest Ref Rng & Units 09/14/2023   11:30 AM 09/07/2023   10:16 AM 04/01/2023    8:30 AM  CBC  WBC 4.0 - 10.5 K/uL 7.1  5.9  6.6   Hemoglobin 12.0 - 15.0 g/dL 85.8  86.2  86.7   Hematocrit 36.0 - 46.0 % 42.4  42.1  40.5   Platelets 150 - 400 K/uL 330  304  302       CMP     Component Value Date/Time   NA 139 09/07/2023 1016   NA 141 09/15/2022 1035   K 3.9 09/07/2023 1016   CL 103 09/07/2023 1016   CO2 26 09/07/2023 1016   GLUCOSE 120 (H) 09/07/2023 1016   BUN 25 (H) 09/07/2023 1016   BUN 18 09/15/2022 1035   CREATININE 0.92 09/07/2023 1016   CREATININE 0.72  04/13/2014 1259   CALCIUM  10.3 09/07/2023 1016   PROT 8.1 09/07/2023 1016   PROT 7.3 09/15/2022 1035   ALBUMIN 4.0 09/07/2023 1016   ALBUMIN 4.4 09/15/2022 1035   AST 17 09/07/2023 1016   ALT 17 09/07/2023 1016   ALKPHOS 99 09/07/2023 1016   BILITOT 0.5 09/07/2023 1016   BILITOT 0.3 09/15/2022 1035  EGFR 69 09/15/2022 1035   GFRNONAA >60 09/07/2023 1016     VAS US  ABI WITH/WO TBI Result Date: 07/03/2024  LOWER EXTREMITY DOPPLER STUDY Patient Name:  Kathleen [name]  Date of Exam:   06/30/2024 Medical Rec #: [id number]        Accession #:    [id number] Date of Birth: 1966-05-02        Patient Gender: F Patient Age:   63 years Exam Location:  [facility] Procedure:      VAS US  ABI WITH/WO TBI Referring Phys: SELINDA DEW --------------------------------------------------------------------------------  Indications: chronic right knee pain High Risk Factors: Hypertension.  Comparison Study: 06/2023 Performing Technologist: Jerel Croak RVT  Examination Guidelines: A complete evaluation includes at minimum, Doppler waveform signals and systolic blood pressure reading at the level of bilateral brachial, anterior tibial, and posterior tibial arteries, when vessel segments are accessible. Bilateral testing is considered an integral part of a complete examination. Photoelectric Plethysmograph (PPG) waveforms and toe systolic pressure readings are included as required and additional duplex testing as needed. Limited examinations for reoccurring indications may be performed as noted.  ABI Findings: +---------+------------------+-----+---------+--------+ Right    Rt Pressure (mmHg)IndexWaveform Comment  +---------+------------------+-----+---------+--------+ Brachial 152                                      +---------+------------------+-----+---------+--------+ PTA      169               1.11 triphasic         +---------+------------------+-----+---------+--------+ DP       165                1.09 triphasic         +---------+------------------+-----+---------+--------+ Burnetta Pillion               1.07 Normal            +---------+------------------+-----+---------+--------+ +---------+------------------+-----+---------+-------+ Left     Lt Pressure (mmHg)IndexWaveform Comment +---------+------------------+-----+---------+-------+ Brachial 150                                     +---------+------------------+-----+---------+-------+ PTA      167               1.10 triphasic        +---------+------------------+-----+---------+-------+ DP       163               1.07 triphasic        +---------+------------------+-----+---------+-------+ Burnetta Toe153               1.01 Normal           +---------+------------------+-----+---------+-------+ +-------+-----------+-----------+------------+------------+ ABI/TBIToday's ABIToday's TBIPrevious ABIPrevious TBI +-------+-----------+-----------+------------+------------+ Right  1.11       1.07       1.15        1.01         +-------+-----------+-----------+------------+------------+ Left   1.10       1.01       1.13        .98          +-------+-----------+-----------+------------+------------+  Bilateral ABIs and TBIs appear essentially unchanged compared to prior study on 06/2023.  Summary: Right: Resting right ankle-brachial index is within normal range. The right toe-brachial index is normal.  Left: Resting left ankle-brachial index is  within normal range. The left toe-brachial index is normal.  *See table(s) above for measurements and observations.  Electronically signed by Selinda Gu MD on 07/03/2024 at 8:27:53 AM.    Final        Assessment & Plan:   1. Primary osteoarthritis of right knee (Primary) Right knee pain Chronic right knee pain with swelling, exacerbated by standing, relieved by brace and compression. Previous treatments ineffective. Differential includes inflammatory causes.  She expressed concerns about post-surgical pain. - Referred to orthopedic specialist at [facility] for second opinion. - Discussed potential benefits of physical therapy for knee strengthening. - Consider anti-inflammatory supplements such as turmeric. - Ambulatory referral to Orthopedic Surgery  2. PAD (peripheral artery disease) Peripheral vascular disease screening Normal ABI readings and triphasic tibial waveforms. No peripheral vascular disease contributing to knee pain. - Continue to monitor for any new symptoms or changes in condition. -Follow up PRN   Assessment and Plan Assessment & Plan       Medications Ordered Prior to Encounter[2]  There are no Patient Instructions on file for this visit. No follow-ups on file.   Sandia Pfund E Jonelle Bann, NP      [1]  Allergies Allergen Reactions   Bee Venom Anaphylaxis and Swelling   Penicillins Itching  [2]  Current Outpatient Medications on File Prior to Visit  Medication Sig Dispense Refill   acetaminophen  (TYLENOL ) 500 MG tablet Take 2 tablets (1,000 mg total) by mouth every 6 (six) hours as needed. 30 tablet 0   aspirin  EC 81 MG tablet Take 1 tablet (81 mg total) by mouth daily. Swallow whole. 30 tablet 11   carvedilol  (COREG ) 25 MG tablet Take 1 tablet (25 mg total) by mouth 2 (two) times daily. 180 tablet 3   cholecalciferol (VITAMIN D3) 25 MCG (1000 UNIT) tablet Take 1,000 Units by mouth daily in the afternoon.     Collagen-Vitamin C 1000-10 MG TABS Take 1 tablet by mouth daily.     Cyanocobalamin  (VITAMIN B12 PO) Take 1 tablet by mouth daily.     ferrous sulfate 325 (65 FE) MG tablet Take 325 mg by mouth daily with breakfast.     lidocaine  (LIDODERM ) 5 % Place 1 patch onto the skin daily. To affected area. Remove & Discard patch within 12 hours or as directed by provider. (Patient taking differently: Place 1 patch onto the skin daily as needed (pain). To affected area. Remove & Discard patch within 12 hours or as  directed by provider.) 30 patch 2   Menthol, Topical Analgesic, (BIOFREEZE ROLL-ON EX) Apply 1 Application topically as needed (knee pain).     Misc Natural Products (OSTEO BI-FLEX/5-LOXIN ADVANCED PO) Take 1 tablet by mouth daily.     Multiple Vitamins-Minerals (ONE-A-DAY ENERGY PO) Take 1 tablet by mouth daily.     Olmesartan -amLODIPine -HCTZ 40-5-12.5 MG TABS Take 1 tablet by mouth daily. 90 tablet 3   Omega-3 Fatty Acids (FISH OIL ) 1000 MG CAPS Take 1 capsule (1,000 mg total) by mouth at bedtime. 90 capsule 3   oxyCODONE  (OXY IR/ROXICODONE ) 5 MG immediate release tablet Take 1 tablet (5 mg total) by mouth 2 (two) times daily as needed for moderate pain (pain score 4-6) or severe pain (pain score 7-10). 14 tablet 0   rosuvastatin  (CRESTOR ) 5 MG tablet Take 1 tablet (5 mg total) by mouth daily. 90 tablet 2   tiZANidine  (ZANAFLEX ) 4 MG tablet Take 1-2 tablets (4-8 mg total) by mouth every 6 (six) hours as needed for muscle spasms.  120 tablet 2   No current facility-administered medications on file prior to visit.   "

## 2024-09-06 ENCOUNTER — Encounter: Payer: 59 | Admitting: Nurse Practitioner
